# Patient Record
Sex: Male | Born: 1985 | Race: White | Hispanic: No | Smoking: Current every day smoker
Health system: Southern US, Community
[De-identification: ages and names within clinical notes are randomized; demographics above are authoritative.]

## PROBLEM LIST (undated history)

## (undated) DIAGNOSIS — B1920 Unspecified viral hepatitis C without hepatic coma: Secondary | ICD-10-CM

## (undated) DIAGNOSIS — F1990 Other psychoactive substance use, unspecified, uncomplicated: Secondary | ICD-10-CM

## (undated) DIAGNOSIS — B182 Chronic viral hepatitis C: Secondary | ICD-10-CM

## (undated) DIAGNOSIS — F952 Tourette's disorder: Secondary | ICD-10-CM

## (undated) HISTORY — PX: TONSILLECTOMY: SUR1361

---
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
## 2007-03-04 ENCOUNTER — Other Ambulatory Visit: Payer: Self-pay

## 2007-03-04 ENCOUNTER — Ambulatory Visit (HOSPITAL_COMMUNITY): Admission: EM | Admit: 2007-03-04 | Discharge: 2007-03-04 | Payer: Self-pay | Admitting: Internal Medicine

## 2010-10-21 NOTE — Consult Note (Signed)
NAME:  Hunter [name], Hunter [name]                  ACCOUNT NO.:  [id number]   MEDICAL RECORD NO.:  [id number]          PATIENT TYPE:  EMS   LOCATION:  ED                            FACILITY:  [facility]   PHYSICIAN:  Barbaraann Barthel, M.D. DATE OF BIRTH:  [birth year]   DATE OF CONSULTATION:  03/04/2007  DATE OF DISCHARGE:  03/04/2007                                 CONSULTATION   Surgery was asked to see this 25 year old white male who had undergone a  tonsillectomy in [place] on March 01, 2007.  He came to  [facility] and began bleeding, came to the emergency room.  I was called  emergently.  We were able to contact the ENT people from [place] and  they made arrangements in the interim to ship him emergently there, as  there was no ENT physician in their local office.   TREATMENT RENDERED HERE:  We had him gargle and I used silver nitrate  sticks to help cauterize an area around his right tonsillar fossa and  held a sponge on a sponge stick against this, and the patient is being  transferred emergently to the ENT people.  His H&H was 13 and 38.      Barbaraann Barthel, M.D.  Electronically Signed     WB/MEDQ  D:  03/04/2007  T:  03/04/2007  Job:  [id number]   cc:   Jeannett Senior. Pollyann Kennedy, MD  Fax: [phone]

## 2010-10-21 NOTE — Op Note (Signed)
NAME:  Rafalski, Vern                  ACCOUNT NO.:  [id number]   MEDICAL RECORD NO.:  [id number]          PATIENT TYPE:  EMS   LOCATION:  MAJO                         FACILITY:  [facility]   PHYSICIAN:  Jefry H. Pollyann Kennedy, MD     DATE OF BIRTH:  [birth year]   DATE OF PROCEDURE:  03/04/2007  DATE OF DISCHARGE:                               OPERATIVE REPORT   PREOPERATIVE DIAGNOSIS:  Post-tonsillectomy hemorrhage.   POSTOPERATIVE DIAGNOSIS:  Post-tonsillectomy hemorrhage.   PROCEDURE:  Examination under anesthesia with control of tonsillectomy  hemorrhage.   SURGEON:  Jefry H. Pollyann Kennedy, MD   General endotracheal anesthesia was used.   No complications.   FINDINGS:  Large area of granulation tissue along the right mid  tonsillar fossa with an active bleed arising from the center of the  granuloma.  No other abnormalities identified.   HISTORY:  A 25 year old underwent tonsillectomy about 8 or 9 days prior  [place].  He had significant bleeding last night and then again  this morning that would not stop.  He was transferred down from [facility] Emergency Room.  The risks, benefits, alternatives, complications  of the procedure were explained to the patient who seemed to understand  and agreed to surgery.   PROCEDURE:  The patient was taken to the operating room and placed on  the operating room table in the supine position.  Following induction of  general endotracheal anesthesia, the table was turned and the patient  was draped in the standard fashion.  A Crowe-Davis mouth gag was  inserted into the oral cavity, used to retract the tongue and mandible  and attached to the Mayo stand.  Blood and clot was evacuated from the  pharynx and the oral cavity.  Careful inspection of the pharynx revealed  the large clot and granulation tissue of the right mid fossa.  This was  all gently dissected off with the suction and Sales executive.  Suction  cautery was then used to cauterize the  bleeding vessel.  The oral cavity  and pharynx were rinsed with saline and suctioned of all blood and  irrigant.  A 16 French [place] sump was entered into the stomach and used  to irrigate with saline and evacuate all of the blood.   The patient was then awakened, extubated and transferred to recovery in  stable condition.      Jefry H. Pollyann Kennedy, MD  Electronically Signed     JHR/MEDQ  D:  03/04/2007  T:  03/04/2007  Job:  [id number]

## 2010-10-21 NOTE — H&P (Signed)
NAME:  Hunter [name], Hunter [name]                  ACCOUNT NO.:  [id number]   MEDICAL RECORD NO.:  [id number]          PATIENT TYPE:  EMS   LOCATION:  MAJO                         FACILITY:  [facility]   PHYSICIAN:  Jefry H. Pollyann Kennedy, MD     DATE OF BIRTH:  1986-04-28   DATE OF ADMISSION:  03/04/2007  DATE OF DISCHARGE:                              HISTORY & PHYSICAL   TIME SEEN:  11:30 a.m.   SITE:  [facility] Emergency Department.   REASON FOR ADMISSION:  Post-tonsillectomy hemorrhage.   HISTORY:  This is a 25 year old who underwent a tonsillectomy in [place] on the previous Tuesday about 8 or 9 days prior to admission.  He came to spend his recuperation time with his mother in [place],  and he started having some hemorrhage last night, and then hemorrhaged  again this morning.  He went to the [facility] Emergency Department, and  was then transferred by ambulance to [facility] for treatment.   PAST MEDICAL HISTORY:  Significant for turrets syndrome.   MEDICATIONS:  No medications.   ALLERGIES:  No known drug allergies.   SOCIAL HISTORY:  He does not smoke or drink.   PHYSICAL EXAMINATION:  Healthy-appearing, young man.  He has active  bleeding from his pharynx.  There is dry blood all over his face and  hands.  There is no other obvious head and neck pathology present.   IMPRESSION:  Post-tonsillectomy hemorrhage.   PLAN:  Admit to the hospital.  Bring him to the operating room as soon  as possible to perform an exam under anesthesia and cauterization to  control the hemorrhage.      Jefry H. Pollyann Kennedy, MD  Electronically Signed     JHR/MEDQ  D:  03/04/2007  T:  03/04/2007  Job:  [id number]

## 2011-03-19 LAB — DIFFERENTIAL
Basophils Absolute: 0
Basophils Relative: 0
Eosinophils Absolute: 0.1
Eosinophils Relative: 1
Lymphocytes Relative: 29
Lymphs Abs: 2.5
Monocytes Absolute: 0.8 — ABNORMAL HIGH
Monocytes Relative: 10
Neutro Abs: 5
Neutrophils Relative %: 60

## 2011-03-19 LAB — CROSSMATCH
ABO/RH(D): O POS
Antibody Screen: NEGATIVE

## 2011-03-19 LAB — CBC
HCT: 32.4 — ABNORMAL LOW
HCT: 38.2 — ABNORMAL LOW
Hemoglobin: 11.2 — ABNORMAL LOW
Hemoglobin: 13.1
MCHC: 34.3
MCHC: 34.5
MCV: 85.7
MCV: 86.3
Platelets: 250
Platelets: 269
RBC: 3.75 — ABNORMAL LOW
RBC: 4.46
RDW: 12.4
RDW: 12.6
WBC: 11 — ABNORMAL HIGH
WBC: 8.4

## 2017-04-26 ENCOUNTER — Other Ambulatory Visit: Payer: Self-pay

## 2017-04-26 ENCOUNTER — Inpatient Hospital Stay (HOSPITAL_BASED_OUTPATIENT_CLINIC_OR_DEPARTMENT_OTHER)
Admission: EM | Admit: 2017-04-26 | Discharge: 2017-05-01 | DRG: 501 | Disposition: A | Discharge status: Home / Self Care | Payer: Self-pay | Attending: Family Medicine | Admitting: Family Medicine

## 2017-04-26 ENCOUNTER — Inpatient Hospital Stay (HOSPITAL_COMMUNITY): Payer: Self-pay | Admitting: Anesthesiology

## 2017-04-26 ENCOUNTER — Emergency Department (HOSPITAL_BASED_OUTPATIENT_CLINIC_OR_DEPARTMENT_OTHER): Payer: Self-pay

## 2017-04-26 ENCOUNTER — Encounter (HOSPITAL_BASED_OUTPATIENT_CLINIC_OR_DEPARTMENT_OTHER): Payer: Self-pay | Admitting: *Deleted

## 2017-04-26 ENCOUNTER — Encounter (HOSPITAL_COMMUNITY)
Admission: EM | Disposition: A | Discharge status: Home / Self Care | Payer: Self-pay | Source: Home / Self Care | Attending: Family Medicine

## 2017-04-26 DIAGNOSIS — D649 Anemia, unspecified: Secondary | ICD-10-CM | POA: Diagnosis present

## 2017-04-26 DIAGNOSIS — R7881 Bacteremia: Secondary | ICD-10-CM

## 2017-04-26 DIAGNOSIS — F1110 Opioid abuse, uncomplicated: Secondary | ICD-10-CM | POA: Diagnosis present

## 2017-04-26 DIAGNOSIS — L03113 Cellulitis of right upper limb: Secondary | ICD-10-CM | POA: Diagnosis present

## 2017-04-26 DIAGNOSIS — L0390 Cellulitis, unspecified: Secondary | ICD-10-CM | POA: Diagnosis present

## 2017-04-26 DIAGNOSIS — A419 Sepsis, unspecified organism: Secondary | ICD-10-CM

## 2017-04-26 DIAGNOSIS — F1990 Other psychoactive substance use, unspecified, uncomplicated: Secondary | ICD-10-CM | POA: Diagnosis present

## 2017-04-26 DIAGNOSIS — L02413 Cutaneous abscess of right upper limb: Secondary | ICD-10-CM | POA: Diagnosis present

## 2017-04-26 DIAGNOSIS — B182 Chronic viral hepatitis C: Secondary | ICD-10-CM | POA: Diagnosis present

## 2017-04-26 DIAGNOSIS — Z23 Encounter for immunization: Secondary | ICD-10-CM

## 2017-04-26 DIAGNOSIS — F17200 Nicotine dependence, unspecified, uncomplicated: Secondary | ICD-10-CM | POA: Diagnosis present

## 2017-04-26 DIAGNOSIS — L02419 Cutaneous abscess of limb, unspecified: Secondary | ICD-10-CM

## 2017-04-26 DIAGNOSIS — B955 Unspecified streptococcus as the cause of diseases classified elsewhere: Secondary | ICD-10-CM | POA: Diagnosis present

## 2017-04-26 DIAGNOSIS — E860 Dehydration: Secondary | ICD-10-CM | POA: Diagnosis present

## 2017-04-26 DIAGNOSIS — L02511 Cutaneous abscess of right hand: Secondary | ICD-10-CM | POA: Diagnosis present

## 2017-04-26 DIAGNOSIS — R509 Fever, unspecified: Secondary | ICD-10-CM

## 2017-04-26 DIAGNOSIS — M659 Synovitis and tenosynovitis, unspecified: Principal | ICD-10-CM | POA: Diagnosis present

## 2017-04-26 DIAGNOSIS — E871 Hypo-osmolality and hyponatremia: Secondary | ICD-10-CM | POA: Diagnosis present

## 2017-04-26 HISTORY — PX: I&D EXTREMITY: SHX5045

## 2017-04-26 HISTORY — DX: Other psychoactive substance use, unspecified, uncomplicated: F19.90

## 2017-04-26 HISTORY — DX: Unspecified viral hepatitis C without hepatic coma: B19.20

## 2017-04-26 HISTORY — DX: Chronic viral hepatitis C: B18.2

## 2017-04-26 LAB — URINALYSIS, ROUTINE W REFLEX MICROSCOPIC
BILIRUBIN URINE: NEGATIVE
Glucose, UA: NEGATIVE mg/dL
HGB URINE DIPSTICK: NEGATIVE
Ketones, ur: NEGATIVE mg/dL
Leukocytes, UA: NEGATIVE
Nitrite: NEGATIVE
PROTEIN: NEGATIVE mg/dL
Specific Gravity, Urine: 1.01 (ref 1.005–1.030)
pH: 6 (ref 5.0–8.0)

## 2017-04-26 LAB — COMPREHENSIVE METABOLIC PANEL
ALBUMIN: 3.6 g/dL (ref 3.5–5.0)
ALK PHOS: 100 U/L (ref 38–126)
ALT: 25 U/L (ref 17–63)
AST: 27 U/L (ref 15–41)
Anion gap: 9 (ref 5–15)
BUN: 14 mg/dL (ref 6–20)
CO2: 25 mmol/L (ref 22–32)
CREATININE: 0.87 mg/dL (ref 0.61–1.24)
Calcium: 8.8 mg/dL — ABNORMAL LOW (ref 8.9–10.3)
Chloride: 94 mmol/L — ABNORMAL LOW (ref 101–111)
GFR calc Af Amer: >60 mL/min (ref >60)
GFR calc non Af Amer: >60 mL/min (ref >60)
GLUCOSE: 108 mg/dL — AB (ref 65–99)
Potassium: 3.6 mmol/L (ref 3.5–5.1)
SODIUM: 128 mmol/L — AB (ref 135–145)
Total Bilirubin: 0.8 mg/dL (ref 0.3–1.2)
Total Protein: 8.3 g/dL — ABNORMAL HIGH (ref 6.5–8.1)

## 2017-04-26 LAB — CBC WITH DIFFERENTIAL/PLATELET
BASOS PCT: 0 %
Basophils Absolute: 0 10*3/uL (ref 0.0–0.1)
EOS ABS: 0.1 10*3/uL (ref 0.0–0.7)
EOS PCT: 1 %
HCT: 34.7 % — ABNORMAL LOW (ref 39.0–52.0)
Hemoglobin: 11.6 g/dL — ABNORMAL LOW (ref 13.0–17.0)
LYMPHS ABS: 1.3 10*3/uL (ref 0.7–4.0)
Lymphocytes Relative: 9 %
MCH: 28.6 pg (ref 26.0–34.0)
MCHC: 33.4 g/dL (ref 30.0–36.0)
MCV: 85.5 fL (ref 78.0–100.0)
Monocytes Absolute: 2.4 10*3/uL — ABNORMAL HIGH (ref 0.1–1.0)
Monocytes Relative: 16 %
Neutro Abs: 11.6 10*3/uL — ABNORMAL HIGH (ref 1.7–7.7)
Neutrophils Relative %: 74 %
PLATELETS: 365 10*3/uL (ref 150–400)
RBC: 4.06 MIL/uL — AB (ref 4.22–5.81)
RDW: 12.5 % (ref 11.5–15.5)
WBC: 15.5 10*3/uL — AB (ref 4.0–10.5)

## 2017-04-26 LAB — I-STAT CG4 LACTIC ACID, ED
Lactic Acid, Venous: 1.8 mmol/L (ref 0.5–1.9)
Lactic Acid, Venous: 1.28 mmol/L (ref 0.5–1.9)

## 2017-04-26 SURGERY — IRRIGATION AND DEBRIDEMENT EXTREMITY
Anesthesia: General | Site: Arm Lower | Laterality: Right

## 2017-04-26 MED ORDER — PROPOFOL 10 MG/ML IV BOLUS
INTRAVENOUS | Status: DC | PRN
  Administered 2017-04-26: 40 mg via INTRAVENOUS
  Administered 2017-04-26: 160 mg via INTRAVENOUS

## 2017-04-26 MED ORDER — ACETAMINOPHEN 500 MG PO TABS
1000.0000 mg | ORAL_TABLET | Freq: Once | ORAL | Status: AC
  Administered 2017-04-26: 1000 mg via ORAL
  Filled 2017-04-26: qty 2

## 2017-04-26 MED ORDER — SUCCINYLCHOLINE CHLORIDE 20 MG/ML IJ SOLN
INTRAMUSCULAR | Status: DC | PRN
  Administered 2017-04-26: 100 mg via INTRAVENOUS

## 2017-04-26 MED ORDER — MIDAZOLAM HCL 2 MG/2ML IJ SOLN
INTRAMUSCULAR | Status: AC
  Filled 2017-04-26: qty 2

## 2017-04-26 MED ORDER — HYDROMORPHONE HCL 1 MG/ML IJ SOLN
0.2500 mg | INTRAMUSCULAR | Status: DC | PRN
  Administered 2017-04-26 – 2017-04-27 (×4): 0.5 mg via INTRAVENOUS

## 2017-04-26 MED ORDER — SODIUM CHLORIDE 0.9 % IV BOLUS (SEPSIS)
1000.0000 mL | Freq: Once | INTRAVENOUS | Status: AC
  Administered 2017-04-26: 1000 mL via INTRAVENOUS

## 2017-04-26 MED ORDER — MIDAZOLAM HCL 5 MG/5ML IJ SOLN
INTRAMUSCULAR | Status: DC | PRN
  Administered 2017-04-26: 2 mg via INTRAVENOUS

## 2017-04-26 MED ORDER — ONDANSETRON HCL 4 MG/2ML IJ SOLN
INTRAMUSCULAR | Status: DC | PRN
  Administered 2017-04-26: 4 mg via INTRAVENOUS

## 2017-04-26 MED ORDER — PIPERACILLIN-TAZOBACTAM 3.375 G IVPB
INTRAVENOUS | Status: AC
  Filled 2017-04-26: qty 50

## 2017-04-26 MED ORDER — VANCOMYCIN HCL IN DEXTROSE 1-5 GM/200ML-% IV SOLN
1000.0000 mg | Freq: Three times a day (TID) | INTRAVENOUS | Status: DC
Start: 2017-04-26 — End: 2017-04-27
  Administered 2017-04-27 (×2): 1000 mg via INTRAVENOUS
  Filled 2017-04-26 (×3): qty 200

## 2017-04-26 MED ORDER — FENTANYL CITRATE (PF) 250 MCG/5ML IJ SOLN
INTRAMUSCULAR | Status: DC | PRN
  Administered 2017-04-26: 50 ug via INTRAVENOUS
  Administered 2017-04-26: 100 ug via INTRAVENOUS
  Administered 2017-04-26 (×2): 50 ug via INTRAVENOUS

## 2017-04-26 MED ORDER — PIPERACILLIN-TAZOBACTAM 3.375 G IVPB 30 MIN
3.3750 g | Freq: Once | INTRAVENOUS | Status: AC
  Administered 2017-04-26: 3.375 g via INTRAVENOUS
  Filled 2017-04-26: qty 50

## 2017-04-26 MED ORDER — VANCOMYCIN HCL 500 MG IV SOLR
INTRAVENOUS | Status: AC
  Filled 2017-04-26: qty 1500

## 2017-04-26 MED ORDER — KETAMINE HCL-SODIUM CHLORIDE 100-0.9 MG/10ML-% IV SOSY
PREFILLED_SYRINGE | INTRAVENOUS | Status: AC
  Filled 2017-04-26: qty 10

## 2017-04-26 MED ORDER — VANCOMYCIN HCL 10 G IV SOLR
1500.0000 mg | Freq: Once | INTRAVENOUS | Status: AC
  Administered 2017-04-26: 1500 mg via INTRAVENOUS
  Filled 2017-04-26: qty 1500

## 2017-04-26 MED ORDER — PIPERACILLIN-TAZOBACTAM 3.375 G IVPB
3.3750 g | Freq: Three times a day (TID) | INTRAVENOUS | Status: DC
  Filled 2017-04-26 (×3): qty 50

## 2017-04-26 MED ORDER — PROPOFOL 10 MG/ML IV BOLUS
INTRAVENOUS | Status: AC
  Filled 2017-04-26: qty 20

## 2017-04-26 MED ORDER — FENTANYL CITRATE (PF) 250 MCG/5ML IJ SOLN
INTRAMUSCULAR | Status: AC
  Filled 2017-04-26: qty 5

## 2017-04-26 MED ORDER — HYDROMORPHONE HCL 1 MG/ML IJ SOLN
INTRAMUSCULAR | Status: AC
  Filled 2017-04-26: qty 2

## 2017-04-26 MED ORDER — LIDOCAINE HCL (CARDIAC) 20 MG/ML IV SOLN
INTRAVENOUS | Status: DC | PRN
  Administered 2017-04-26: 100 mg via INTRATRACHEAL

## 2017-04-26 MED ORDER — KETAMINE HCL 10 MG/ML IJ SOLN
INTRAMUSCULAR | Status: DC | PRN
  Administered 2017-04-26: 20 mg via INTRAVENOUS
  Administered 2017-04-26: 50 mg via INTRAVENOUS

## 2017-04-26 MED ORDER — LACTATED RINGERS IV SOLN
INTRAVENOUS | Status: DC | PRN
  Administered 2017-04-26: 23:00:00 via INTRAVENOUS

## 2017-04-26 MED ORDER — PROMETHAZINE HCL 25 MG/ML IJ SOLN: 6.2500 mg | INTRAMUSCULAR | Status: DC | PRN

## 2017-04-26 MED ORDER — SODIUM CHLORIDE 0.9 % IV SOLN
Freq: Once | INTRAVENOUS | Status: AC
  Administered 2017-04-26: 17:00:00 via INTRAVENOUS

## 2017-04-26 SURGICAL SUPPLY — 40 items
BANDAGE ACE 3X5.8 VEL STRL LF (GAUZE/BANDAGES/DRESSINGS) ×3 IMPLANT
BANDAGE ACE 4X5 VEL STRL LF (GAUZE/BANDAGES/DRESSINGS) ×3 IMPLANT
BNDG ESMARK 4X9 LF (GAUZE/BANDAGES/DRESSINGS) ×3 IMPLANT
BNDG GAUZE ELAST 4 BULKY (GAUZE/BANDAGES/DRESSINGS) ×3 IMPLANT
CORDS BIPOLAR (ELECTRODE) ×3 IMPLANT
COVER SURGICAL LIGHT HANDLE (MISCELLANEOUS) ×3 IMPLANT
CUFF TOURNIQUET SINGLE 18IN (TOURNIQUET CUFF) ×3 IMPLANT
DRAPE SURG 17X23 STRL (DRAPES) ×3 IMPLANT
DRSG ADAPTIC 3X8 NADH LF (GAUZE/BANDAGES/DRESSINGS) ×3 IMPLANT
GAUZE SPONGE 4X4 12PLY STRL (GAUZE/BANDAGES/DRESSINGS) ×9 IMPLANT
GAUZE SPONGE 4X4 12PLY STRL LF (GAUZE/BANDAGES/DRESSINGS) ×3 IMPLANT
GLOVE SURG ORTHO 8.0 STRL STRW (GLOVE) ×3 IMPLANT
GOWN STRL REUS W/ TWL LRG LVL3 (GOWN DISPOSABLE) ×3 IMPLANT
GOWN STRL REUS W/ TWL XL LVL3 (GOWN DISPOSABLE) ×1 IMPLANT
GOWN STRL REUS W/TWL LRG LVL3 (GOWN DISPOSABLE) ×6
GOWN STRL REUS W/TWL XL LVL3 (GOWN DISPOSABLE) ×2
KIT BASIN OR (CUSTOM PROCEDURE TRAY) ×3 IMPLANT
KIT ROOM TURNOVER OR (KITS) ×3 IMPLANT
MANIFOLD NEPTUNE II (INSTRUMENTS) ×3 IMPLANT
NEEDLE HYPO 25GX1X1/2 BEV (NEEDLE) IMPLANT
NS IRRIG 1000ML POUR BTL (IV SOLUTION) ×3 IMPLANT
PACK ORTHO EXTREMITY (CUSTOM PROCEDURE TRAY) ×3 IMPLANT
PAD ARMBOARD 7.5X6 YLW CONV (MISCELLANEOUS) ×6 IMPLANT
PAD CAST 4YDX4 CTTN HI CHSV (CAST SUPPLIES) ×1 IMPLANT
PADDING CAST COTTON 4X4 STRL (CAST SUPPLIES) ×2
SET CYSTO W/LG BORE CLAMP LF (SET/KITS/TRAYS/PACK) ×3 IMPLANT
SOAP 2 % CHG 4 OZ (WOUND CARE) ×3 IMPLANT
SPONGE LAP 18X18 X RAY DECT (DISPOSABLE) ×3 IMPLANT
SPONGE LAP 4X18 X RAY DECT (DISPOSABLE) ×3 IMPLANT
SUCTION FRAZIER HANDLE 10FR (MISCELLANEOUS) ×2
SUCTION TUBE FRAZIER 10FR DISP (MISCELLANEOUS) ×1 IMPLANT
SWAB COLLECTION DEVICE MRSA (MISCELLANEOUS) ×3 IMPLANT
SWAB CULTURE ESWAB REG 1ML (MISCELLANEOUS) ×3 IMPLANT
TOWEL OR 17X24 6PK STRL BLUE (TOWEL DISPOSABLE) ×3 IMPLANT
TOWEL OR 17X26 10 PK STRL BLUE (TOWEL DISPOSABLE) ×3 IMPLANT
TUBE CONNECTING 12'X1/4 (SUCTIONS) ×1
TUBE CONNECTING 12X1/4 (SUCTIONS) ×2 IMPLANT
UNDERPAD 30X30 (UNDERPADS AND DIAPERS) ×3 IMPLANT
WATER STERILE IRR 1000ML POUR (IV SOLUTION) ×3 IMPLANT
YANKAUER SUCT BULB TIP NO VENT (SUCTIONS) ×3 IMPLANT

## 2017-04-26 NOTE — ED Triage Notes (Signed)
Infection in his right hand x 4 days as a result of heroin injection.

## 2017-04-26 NOTE — ED Notes (Signed)
[facility]

## 2017-04-26 NOTE — Progress Notes (Addendum)
Pharmacy Antibiotic Note  Hunter [name] is a 31 y.o. male admitted on 04/26/2017 with wound infection.  Pharmacy has been consulted for vancomycin and Zosyn dosing. History of recent IV drug use. WBC 15.5, Tmax 103.2  Plan: Vancomycin 1500mg  IV once then 1000mg  IV every 8 hours.  Goal trough 15-20 mcg/mL. Zosyn 3.375g IV q8h (4 hour infusion).  Height: 6\' 1"  (185.4 cm) Weight: 170 lb (77.1 kg) IBW/kg (Calculated) : 79.9  Temp (24hrs), Avg:103.2 F (39.6 C), Min:103.2 F (39.6 C), Max:103.2 F (39.6 C)  Recent Labs  Lab 04/26/17 1614 04/26/17 1627  WBC 15.5*  --   CREATININE 0.87  --   LATICACIDVEN  --  1.80    Estimated Creatinine Clearance: 134.2 mL/min (by C-G formula based on SCr of 0.87 mg/dL).    No Known Allergies   Thank you for allowing pharmacy to be a part of this patient's care.  Toniann Failony L Pharoah Goggins 04/26/2017 4:53 PM

## 2017-04-26 NOTE — ED Notes (Signed)
Family at bedside. 

## 2017-04-26 NOTE — Consult Note (Addendum)
Reason for Consult: Right hand abscess Referring Physician: Dr. Henreitta Leber Jvion Turgeon is an 31 y.o. male.  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 Presented with right hand swelling/pain meeting criteria for Sepsis secondary to cellulitis/hand wound. Started on Vancomycin and Zosyn. Blood cultures pending. No imaging requested per hand surgery. Not given IV fluid bolus secondary to stable blood pressure and normal lactic acid. Accepted to telemetry. Inpatient status.    Past Medical History:  Diagnosis Date  . Hepatitis-C     Past Surgical History:  Procedure Laterality Date  . TONSILLECTOMY      History reviewed. No pertinent family history.  Social History:  reports that he has been smoking.  he has never used smokeless tobacco. He reports that he uses drugs. Drug: IV. He reports that he does not drink alcohol.  Allergies: No Known Allergies  Medications: I have reviewed the patient's current medications.  Results for orders placed or performed during the hospital encounter of 04/26/17 (from the past 48 hour(s))  Comprehensive metabolic panel     Status: Abnormal   Collection Time: 04/26/17  4:14 PM  Result Value Ref Range   Sodium 128 (L) 135 - 145 mmol/L   Potassium 3.6 3.5 - 5.1 mmol/L   Chloride 94 (L) 101 - 111 mmol/L   CO2 25 22 - 32 mmol/L   Glucose, Bld 108 (H) 65 - 99 mg/dL   BUN 14 6 - 20 mg/dL   Creatinine, Ser 0.87 0.61 - 1.24 mg/dL   Calcium 8.8 (L) 8.9 - 10.3 mg/dL   Total Protein 8.3 (H) 6.5 - 8.1 g/dL   Albumin 3.6 3.5 - 5.0 g/dL   AST 27 15 - 41 U/L   ALT 25 17 - 63 U/L   Alkaline Phosphatase 100 38 - 126 U/L   Total Bilirubin 0.8 0.3 - 1.2 mg/dL   GFR calc non Af Amer >60 >60 mL/min   GFR calc Af Amer >60 >60 mL/min    Comment: (NOTE) The eGFR has been calculated using the CKD EPI equation. This calculation has not been validated in all clinical situations. eGFR's persistently <60 mL/min signify possible Chronic Kidney Disease.     Anion gap 9 5 - 15  CBC with Differential     Status: Abnormal   Collection Time: 04/26/17  4:14 PM  Result Value Ref Range   WBC 15.5 (H) 4.0 - 10.5 K/uL   RBC 4.06 (L) 4.22 - 5.81 MIL/uL   Hemoglobin 11.6 (L) 13.0 - 17.0 g/dL   HCT 34.7 (L) 39.0 - 52.0 %   MCV 85.5 78.0 - 100.0 fL   MCH 28.6 26.0 - 34.0 pg   MCHC 33.4 30.0 - 36.0 g/dL   RDW 12.5 11.5 - 15.5 %   Platelets 365 150 - 400 K/uL   Neutrophils Relative % 74 %   Neutro Abs 11.6 (H) 1.7 - 7.7 K/uL   Lymphocytes Relative 9 %   Lymphs Abs 1.3 0.7 - 4.0 K/uL   Monocytes Relative 16 %   Monocytes Absolute 2.4 (H) 0.1 - 1.0 K/uL   Eosinophils Relative 1 %   Eosinophils Absolute 0.1 0.0 - 0.7 K/uL   Basophils Relative 0 %   Basophils Absolute 0.0 0.0 - 0.1 K/uL  I-Stat CG4 Lactic Acid, ED     Status: None   Collection Time: 04/26/17  4:27 PM  Result Value Ref Range   Lactic Acid, Venous 1.80 0.5 - 1.9 mmol/L  Wound or Superficial Culture     Status: None (Preliminary result)   Collection Time: 04/26/17  4:30 PM  Result Value Ref Range   Specimen Description WOUND RIGHT HAND    Special Requests Normal    Gram Stain      NO WBC SEEN FEW GRAM POSITIVE COCCI Performed at [facility], [place] [address], [place], [place] [address]    Culture PENDING    Report Status PENDING   I-Stat CG4 Lactic Acid, ED     Status: None   Collection Time: 04/26/17  6:27 PM  Result Value Ref Range   Lactic Acid, Venous 1.28 0.5 - 1.9 mmol/L  Urinalysis, Routine w reflex microscopic     Status: None   Collection Time: 04/26/17  6:40 PM  Result Value Ref Range   Color, Urine YELLOW YELLOW   APPearance CLEAR CLEAR   Specific Gravity, Urine 1.010 1.005 - 1.030   pH 6.0 5.0 - 8.0   Glucose, UA NEGATIVE NEGATIVE mg/dL   Hgb urine dipstick NEGATIVE NEGATIVE   Bilirubin Urine NEGATIVE NEGATIVE   Ketones, ur NEGATIVE NEGATIVE mg/dL   Protein, ur NEGATIVE NEGATIVE mg/dL   Nitrite NEGATIVE NEGATIVE   Leukocytes, UA NEGATIVE NEGATIVE     Comment: Microscopic not done on urines with negative protein, blood, leukocytes, nitrite, or glucose < 500 mg/dL.    Dg Chest Port 1 View  Result Date: 04/26/2017 CLINICAL DATA:  Fever.  Right hand swelling. EXAM: PORTABLE CHEST 1 VIEW COMPARISON:  None. FINDINGS: The cardiomediastinal silhouette is within normal limits. The bronchovascular markings are minimally prominent bilaterally without evidence of lobar consolidation, overt edema, sizable pleural effusion, or pneumothorax. No acute osseous abnormality is seen. IMPRESSION: No evidence of pneumonia. Electronically Signed   By: Logan Bores M.D.   On: 04/26/2017 17:38   Dg Hand Complete Right  Result Date: 04/26/2017 CLINICAL DATA:  Right hand swelling EXAM: RIGHT HAND - COMPLETE 3+ VIEW COMPARISON:  None. FINDINGS: There is severe soft tissue swelling of the right hand. The bones are normal. No soft tissue emphysema. IMPRESSION: Severe right hand soft tissue swelling without osseous abnormality. Electronically Signed   By: Ulyses Jarred M.D.   On: 04/26/2017 17:37    ROS as noted in chart   Blood pressure 108/68, pulse 86, temperature 98.9 F (37.2 C), temperature source Oral, resp. rate 20, height 6' 1"  (1.854 m), weight 170 lb (77.1 kg), SpO2 100 %. Physical Exam The patient does have the large amount of swelling over the dorsal aspect of the hand. The patient does have the fluctuant area and the evidence of the deep space abscess Large amount of swelling in the forearm. He is able to extend his thumb extend his digits his fingertips are warm well perfused  Assessment/Plan: Intravenous drug use, current heroin use this morning Right forearm and hand abscess   Right hand and forearm incision and drainage  Patient be admitted back to the medicine service following completion of this study and surgery. Continue inpatient care. The patient may require repeat I&D within 24 hours, 48 hours We'll follow closely    Linna Hoff 04/26/2017, 10:50 PM

## 2017-04-26 NOTE — Anesthesia Procedure Notes (Signed)
Procedure Name: Intubation Date/Time: 04/26/2017 11:02 PM Performed by: Claudina LickMahony, Kavina Cantave D, CRNA Pre-anesthesia Checklist: Patient identified, Emergency Drugs available, Suction available, Patient being monitored and Timeout performed Patient Re-evaluated:Patient Re-evaluated prior to induction Oxygen Delivery Method: Circle system utilized Preoxygenation: Pre-oxygenation with 100% oxygen Induction Type: IV induction, Rapid sequence and Cricoid Pressure applied Laryngoscope Size: Miller and 2 Grade View: Grade I Tube type: Oral Tube size: 7.5 mm Number of attempts: 1 Airway Equipment and Method: Stylet Placement Confirmation: ETT inserted through vocal cords under direct vision,  positive ETCO2 and breath sounds checked- equal and bilateral Secured at: 22 cm Tube secured with: Tape Dental Injury: Teeth and Oropharynx as per pre-operative assessment

## 2017-04-26 NOTE — ED Notes (Signed)
ED Provider at bedside. 

## 2017-04-26 NOTE — ED Provider Notes (Addendum)
[facility] NORTH ORTHOPEDICS Provider Note   CSN: [id number] Arrival date & time: 04/26/17  1550     History   Chief Complaint Chief Complaint  Patient presents with  . Recurrent Skin Infections    HPI Hunter [name] is a 31 y.o. male with a history of IV heroin use and Tourette's syndrome who presents to the emergency department with right forearm and right hand, warmth, pain, and swelling with associated fever. Febrile to 103.2 in the ED.   The patient reports that he noticed an abscess to the right forearm that began draining spontaneously 1 week ago.  He reports mild redness and swelling to the right forearm.  He reports rapidly worsening redness, warmth, and swelling to the right hand and all fingers in the last 24 hours.  He also states that an area on the dorsum of the right hands began actively draining purulent material overnight.  No treatment prior to arrival.  He reports that the pain is worse with straightening the fingers are moving the right arm.  No alleviating symptoms.  No history of similar.  No allergies to medications.  He denies chest pain, palpitations, or shortness of breath.   He reports he last ate and drank about 3:45 PM.   The history is provided by the patient. No language interpreter was used.    Past Medical History:  Diagnosis Date  . Chronic hepatitis C without hepatic coma (HCC) 04/28/2017  . Hepatitis-C   . IVDU (intravenous drug user)   . Tourette's     Patient Active Problem List   Diagnosis Date Noted  . Atelectasis of both lungs   . Chest wall abscess   . Psoas abscess (HCC) 05/23/2017  . Bacteremia due to methicillin susceptible Staphylococcus aureus (MSSA) 05/23/2017  . Discitis of lumbar region 05/22/2017  . Hypokalemia 05/22/2017  . Normocytic anemia 04/28/2017  . Chronic hepatitis C without hepatic coma (HCC) 04/28/2017  . IVDU (intravenous drug user) 04/27/2017    Past Surgical History:   Procedure Laterality Date  . I&D EXTREMITY Right 04/26/2017   Procedure: IRRIGATION AND DEBRIDEMENT RIGHT HAND AND FOREARM;  Surgeon: Bradly Bienenstock, MD;  Location: [facility] OR;  Service: Orthopedics;  Laterality: Right;  . TEE WITHOUT CARDIOVERSION N/A 04/30/2017   Procedure: TRANSESOPHAGEAL ECHOCARDIOGRAM (TEE);  Surgeon: Quintella Reichert, MD;  Location: [facility] ENDOSCOPY;  Service: Cardiovascular;  Laterality: N/A;  . TEE WITHOUT CARDIOVERSION N/A 05/28/2017   Procedure: TRANSESOPHAGEAL ECHOCARDIOGRAM (TEE);  Surgeon: Pricilla Riffle, MD;  Location: [facility] ENDOSCOPY;  Service: Cardiovascular;  Laterality: N/A;  . TONSILLECTOMY         Home Medications    Prior to Admission medications   Medication Sig Start Date End Date Taking? Authorizing Provider  acetaminophen (TYLENOL) 325 MG tablet Take 2 tablets (650 mg total) by mouth every 6 (six) hours as needed for mild pain (or Fever >/= 101). Patient not taking: Reported on 05/22/2017 05/01/17   Standley Brooking, MD  cyclobenzaprine (FLEXERIL) 5 MG tablet Take 1 tablet (5 mg total) by mouth 3 (three) times daily as needed for muscle spasms. 07/03/17   Darlin Drop, DO    Family History History reviewed. No pertinent family history.  Social History Social History   Tobacco Use  . Smoking status: Current Every Day Smoker    Types: Cigarettes  . Smokeless tobacco: Never Used  Substance Use Topics  . Alcohol use: No    Frequency: Never  . Drug use:  Yes    Types: IV    Comment: heroin     Allergies   Patient has no known allergies.   Review of Systems Review of Systems  Constitutional: Positive for fever. Negative for activity change.  Respiratory: Negative for shortness of breath.   Cardiovascular: Negative for chest pain.  Gastrointestinal: Negative for abdominal pain.  Musculoskeletal: Positive for arthralgias and myalgias. Negative for back pain.  Skin: Positive for color change and wound. Negative for rash.   Physical  Exam Updated Vital Signs BP 115/75 (BP Location: Left Arm)   Pulse 66   Temp 98.7 F (37.1 C) (Oral)   Resp 16   Ht 6\' 1"  (1.854 m)   Wt 77.1 kg (170 lb)   SpO2 100%   BMI 22.43 kg/m   Physical Exam  Constitutional: He appears well-developed. No distress.  HENT:  Head: Normocephalic.  Eyes: Conjunctivae are normal.  Neck: Neck supple.  Cardiovascular: Normal rate and regular rhythm. Exam reveals no gallop and no friction rub.  No murmur heard. Tachycardia.  Normal S1-S2.  No murmurs, rubs, or gallops.  Pulmonary/Chest: Effort normal and breath sounds normal. No stridor. No respiratory distress. He has no wheezes. He has no rales.  Abdominal: Soft. He exhibits no distension.  Musculoskeletal:  Significant erythema, edema, with moderate warmth extending from distal to the elbow to the fingertips on the right hand.  On the palmar surface of the right hand, there is significant redness and swelling over the hyperthenar eminence and first and second digits.  On the radial aspect of the dorsum of the right hand, there is an area that is actively expressing purulent drainage.  On the dorsum of the forearm, just distal to the elbow, there is a second area that is actively expressing purulent drainage.  Neurological: He is alert.  Skin: Skin is warm and dry.  Psychiatric: His behavior is normal.  Nursing note and vitals reviewed.           ED Treatments / Results  Labs (all labs ordered are listed, but only abnormal results are displayed) Labs Reviewed  CULTURE, BLOOD (ROUTINE X 2) - Abnormal; Notable for the following components:      Result Value   Culture   (*)    Value: GROUP A STREP (S.PYOGENES) ISOLATED SUSCEPTIBILITIES PERFORMED ON PREVIOUS CULTURE WITHIN THE LAST 5 DAYS. HEALTH DEPARTMENT NOTIFIED Performed at [facility], [address] [place], [place] [address]    All other components within normal limits  CULTURE, BLOOD (ROUTINE X 2) - Abnormal;  Notable for the following components:   Culture   (*)    Value: GROUP A STREP (S.PYOGENES) ISOLATED HEALTH DEPARTMENT NOTIFIED Performed at [facility], [address] [place], [place] [address]    All other components within normal limits  SURGICAL PCR SCREEN - Abnormal; Notable for the following components:   Staphylococcus aureus POSITIVE (*)    All other components within normal limits  BLOOD CULTURE ID PANEL (REFLEXED) - Abnormal; Notable for the following components:   Streptococcus species DETECTED (*)    Streptococcus pyogenes DETECTED (*)    All other components within normal limits  COMPREHENSIVE METABOLIC PANEL - Abnormal; Notable for the following components:   Sodium 128 (*)    Chloride 94 (*)    Glucose, Bld 108 (*)    Calcium 8.8 (*)    Total Protein 8.3 (*)    All other components within normal limits  CBC WITH  DIFFERENTIAL/PLATELET - Abnormal; Notable for the following components:   WBC 15.5 (*)    RBC 4.06 (*)    Hemoglobin 11.6 (*)    HCT 34.7 (*)    Neutro Abs 11.6 (*)    Monocytes Absolute 2.4 (*)    All other components within normal limits  CBC - Abnormal; Notable for the following components:   WBC 13.3 (*)    RBC 3.42 (*)    Hemoglobin 9.8 (*)    HCT 29.5 (*)    All other components within normal limits  BASIC METABOLIC PANEL - Abnormal; Notable for the following components:   Sodium 133 (*)    Glucose, Bld 121 (*)    Calcium 7.9 (*)    All other components within normal limits  HEPATITIS PANEL, ACUTE - Abnormal; Notable for the following components:   HCV Ab >11.0 (*)    All other components within normal limits  CBC - Abnormal; Notable for the following components:   RBC 3.91 (*)    Hemoglobin 11.1 (*)    HCT 34.2 (*)    All other components within normal limits  BASIC METABOLIC PANEL - Abnormal; Notable for the following components:   Glucose, Bld 107 (*)    Calcium 8.8 (*)    All other components within normal limits  AEROBIC  CULTURE (SUPERFICIAL SPECIMEN)  AEROBIC/ANAEROBIC CULTURE (SURGICAL/DEEP WOUND)  CULTURE, BLOOD (ROUTINE X 2)  CULTURE, BLOOD (ROUTINE X 2)  URINALYSIS, ROUTINE W REFLEX MICROSCOPIC  HIV ANTIBODY (ROUTINE TESTING)  BASIC METABOLIC PANEL  PROTIME-INR  I-STAT CG4 LACTIC ACID, ED  I-STAT CG4 LACTIC ACID, ED    EKG  EKG Interpretation None       Radiology No results found.  Procedures Procedures (including critical care time)  CRITICAL CARE Performed by: Barkley Boards Total critical care time: 30 minutes Critical care time was exclusive of separately billable procedures and treating other patients. Critical care was necessary to treat or prevent imminent or life-threatening deterioration. Critical care was time spent personally by me on the following activities: development of treatment plan with patient and/or surrogate as well as nursing, discussions with consultants, evaluation of patient's response to treatment, examination of patient, obtaining history from patient or surrogate, ordering and performing treatments and interventions, ordering and review of laboratory studies, ordering and review of radiographic studies, pulse oximetry and re-evaluation of patient's condition.  Medications Ordered in ED Medications  piperacillin-tazobactam (ZOSYN) 3.375 GM/50ML IVPB (has no administration in time range)  vancomycin (VANCOCIN) 500 MG powder (has no administration in time range)  HYDROmorphone (DILAUDID) 1 MG/ML injection (has no administration in time range)  HYDROmorphone (DILAUDID) 1 MG/ML injection (has no administration in time range)  acetaminophen (TYLENOL) tablet 1,000 mg (1,000 mg Oral Given 04/26/17 1615)  sodium chloride 0.9 % bolus 1,000 mL (0 mLs Intravenous Stopped 04/26/17 1723)  vancomycin (VANCOCIN) 1,500 mg in sodium chloride 0.9 % 500 mL IVPB (0 mg Intravenous Stopped 04/26/17 1916)  piperacillin-tazobactam (ZOSYN) IVPB 3.375 g (0 g Intravenous Stopped  04/26/17 1702)  0.9 %  sodium chloride infusion ( Intravenous Stopped 04/27/17 0043)  0.9 %  sodium chloride infusion ( Intravenous New Bag/Given 04/27/17 0040)  cyclobenzaprine (FLEXERIL) tablet 7.5 mg (7.5 mg Oral Given 04/27/17 0359)  Influenza vac split quadrivalent PF (FLUARIX) injection 0.5 mL (0.5 mLs Intramuscular Given 04/29/17 1300)     Initial Impression / Assessment and Plan / ED Course  I have reviewed the triage vital signs and the nursing notes.  Pertinent labs & imaging results that were available during my care of the patient were reviewed by me and considered in my medical decision making (see chart for details).     31 year old male with a history of Tourette's syndrome and IV heroin use presenting with fever and significant erythema, edema, and pain in the right hand and forearm.  An area of the distal forearm and radial aspect of the dorsum of the right hand are actively expressing purulent drainage.  Symptoms are concerning for pyogenic tenosynovitis vs underlying necrotizing fasciitis.  Doubt compartment syndrome at this time.  Leukocytosis of 15.  Febrile to 103.2.  Tachycardic in the 120s.  Code sepsis initiated.  Blood cultures x2 were drawn prior to antibiotic administration.  Vancomycin and Zosyn were dosed per pharmacy and started in the emergency department.  The patient was given 1 bolus of fluids and then kept on maintenance fluids.  The patient was discussed with Dr. Eudelia Bunchardama, attending physician.  Consulted hand surgery and spoke with Dr. Melvyn Novasrtmann who will plan for washout in the OR at [facility].  He is currently n.p.o.  Consulted the hospitalist team and Dr. Caleb PoppNettey spoke with Dr. Eudelia Bunchardama who will accept the patient for admission. The patient appears reasonably stabilized for admission considering the current resources, flow, and capabilities available in the ED at this time, and I doubt any other [facility] requiring further screening and/or treatment in the ED prior to  admission.  Final Clinical Impressions(s) / ED Diagnoses   Final diagnoses:  Fever  Abscess of forearm    ED Discharge Orders        Ordered    acetaminophen (TYLENOL) 325 MG tablet  Every 6 hours PRN     05/01/17 1130    amoxicillin-clavulanate (AUGMENTIN) 875-125 MG tablet  2 times daily,   Status:  Discontinued     05/01/17 1130    Discharge instructions    Comments:  Call your physician or seek immediate medical attention for fever, increased pain, redness of hand, swelling, drainage, numbness or worsening of condition. Do not get right arm wet. Leave splint in place until seen by hand surgeon Dr. Melvyn Novasrtmann.   05/01/17 1130    Increase activity slowly     05/01/17 1130    Diet general     05/01/17 1130       Barkley BoardsMcDonald, Maili Shutters A, PA-C 04/26/17 2055    Frederik PearMcDonald, Jaylei Fuerte A, PA-C 09/15/17 1616    Nira Connardama, Pedro Eduardo, MD 09/16/17 [id number]

## 2017-04-26 NOTE — ED Provider Notes (Signed)
Medical screening examination/treatment/procedure(s) were conducted as a shared visit with non-physician practitioner(s) and myself.  I personally evaluated the patient during the encounter. Briefly, the patient is a 31 y.o. male here with right hand and forearm pain in the setting of IV drug use.  Exam notable for significantly swollen and erythematous right hand and forearm with purulent discharge.  Presentation is concerning for deep tissue infection requiring admission, IV antibiotics and hand evaluation for likely incision and drainage..    EKG Interpretation None           Genine Beckett, Hunter GarnetPedro Eduardo, MD 04/26/17 1744

## 2017-04-26 NOTE — Progress Notes (Signed)
31 yo male with a history of IV heroin drug use. Presented with right hand swelling/pain meeting criteria for Sepsis secondary to cellulitis/hand wound. Hand surgery, Dr. Orlan Leavensrtman, consulted and will see on arrival [facility]. Started on Vancomycin and Zosyn. Blood cultures pending. No imaging requested per hand surgery. Not given IV fluid bolus secondary to stable blood pressure and normal lactic acid. Accepted to telemetry. Inpatient status.   Jacquelin Hawkingalph Nettey, MD [facility] 04/26/2017, 5:22 PM Pager: [phone]

## 2017-04-26 NOTE — Op Note (Signed)
PREOPERATIVE DIAGNOSIS: Right hand deep space abscess Right forearm deep space abscess Right wrist extensor tenosynovitis, purulent Active intravenous drug use  POSTOPERATIVE DIAGNOSIS: Same  ATTENDING SURGEON: Dr. Gilman SchmidtFred Ortman who was scrubbed and present for the entire procedure  ASSISTANT SURGEON: None  ANESTHESIA: Gen. via endotracheal tube  OPERATIVE PROCEDURE: #1: Right forearm drainage deep space abscess #2: Right hand drainage deep space abscess to include the hand and wrist #3: Right wrist extensor tenosynovectomy fourth dorsal compartment #4 right wrist second dorsal compartment extensor tenosynovectomy #5: Right thumb EPL extensor tenosynovectomy  IMPLANTS: None  RADIOGRAPHIC INTERPRETATION: None  SURGICAL INDICATIONS: Patient is a right-hand-dominant gentleman with active intravenous drug use or worsening infection and pain. Patient presented to the urgent care and [place] was transferred [facility] for definitive treatment. Risks benefits and alternatives were discussed in detail with the patient in a signed informed consent was obtained.  SURGICAL TECHNIQUE: The patient is properly identified in the preoperative holding area and a mark with a permanent marker made on the right hand indicate correct operative site. Patient is then brought back to operating room placed supine on anesthesia room table for endotracheal anesthesia was administered. Patient received preoperative antibiotics. A well-padded tourniquet was then placed on the right brachium and sealed with the appropriate drape. The right upper extremities and prepped and draped in normal sterile fashion. A timeout was called the correct site was identified and the procedure then begun. Attention was then turned the dorsal aspect of the right hand. Curvilinear incision made directly over the dorsal aspect of the hand extending across the wrist crease. Dissection carried down through skin and subcutaneous  tissue with a gross purulence was then identified. The patient had a gross purulence along the second third and fourth dorsal compartments. Tenosynovectomy was then carried out of the each individual compartment. Wound cultures were then sent. Debridement was then carried out of the skin subcutaneous tissue and the extensor retinaculum given the purulent material. Attention was then turned the right forearm for a separate incision in the proximal radial aspect of the forearm a longitudinal incision made directly over the abscess area. Deep dissection carried down through the subcutaneous tissue through the fascial layer with the abscess was then drained. After open up of both areas the wounds were then thoroughly irrigated. Thorough wound irrigation was then done both abscess areas. The wounds were then loosely reapproximated and closed. Adaptic dressing and a sterile compressive bandage then applied. The patient is then placed in well-padded volar splint explained taken recovery room in good condition.  POSTOPERATIVE PLAN: The patient be admitted to the hospitalist service. Continue on the IV antibiotics and pain control. We'll look of the wounds and 48 hours the patient may need repeat I&D depending on the quality of the wounds. Wound cultures will need to be followed up on.

## 2017-04-26 NOTE — ED Notes (Signed)
Attempt to  call report , nurse not available 

## 2017-04-26 NOTE — Transfer of Care (Signed)
Please cosign corrected note in order to complete chart correction case 

## 2017-04-26 NOTE — Transfer of Care (Incomplete Revision)
Immediate Anesthesia Transfer of Care Note  [facility]  Procedure(s) Performed: IRRIGATION AND DEBRIDEMENT RIGHT HAND AND FOREARM (Right Arm Lower)  Patient Location: PACU  Anesthesia Type:General  Level of Consciousness: drowsy  Airway & Oxygen Therapy: Patient Spontanous Breathing  Post-op Assessment: Report given to RN and Post -op Vital signs reviewed and stable  Post vital signs: Reviewed and stable  Last Vitals:  Vitals:   04/26/17 2136 04/26/17 2345  BP: 108/68   Pulse: 86 98  Resp: 20 (!) 23  Temp: 37.2 C 36.5 C  SpO2: 100% 97%    Last Pain:  Vitals:   04/26/17 2136  TempSrc: Oral  PainSc:          Complications: No apparent anesthesia complications

## 2017-04-26 NOTE — ED Notes (Signed)
Pt transferred to 5 n rm 16 by carelink

## 2017-04-26 NOTE — ED Notes (Addendum)
Girlfriend notified of transfer and rm assignment Foye ClockKristina [phone]

## 2017-04-26 NOTE — Anesthesia Preprocedure Evaluation (Addendum)
Anesthesia Evaluation  Patient identified by MRN, date of birth, ID band Patient awake    Reviewed: Allergy & Precautions, NPO status , Patient's Chart, lab work & pertinent test results  Airway Mallampati: II  TM Distance: >3 FB Neck ROM: Full    Dental  (+) Dental Advisory Given   Pulmonary Current Smoker,    breath sounds clear to auscultation       Cardiovascular negative cardio ROS   Rhythm:Regular Rate:Normal     Neuro/Psych negative neurological ROS     GI/Hepatic negative GI ROS, (+)     substance abuse  IV drug use, Hepatitis -, C  Endo/Other  negative endocrine ROS  Renal/GU negative Renal ROS     Musculoskeletal   Abdominal   Peds  Hematology negative hematology ROS (+)   Anesthesia Other Findings   Reproductive/Obstetrics                            Lab Results  Component Value Date   WBC 15.5 (H) 04/26/2017   HGB 11.6 (L) 04/26/2017   HCT 34.7 (L) 04/26/2017   MCV 85.5 04/26/2017   PLT 365 04/26/2017   Lab Results  Component Value Date   CREATININE 0.87 04/26/2017   BUN 14 04/26/2017   NA 128 (L) 04/26/2017   K 3.6 04/26/2017   CL 94 (L) 04/26/2017   CO2 25 04/26/2017    Anesthesia Physical Anesthesia Plan  ASA: II and emergent  Anesthesia Plan: General   Post-op Pain Management:    Induction: Intravenous  PONV Risk Score and Plan: 2 and Dexamethasone, Ondansetron and Treatment may vary due to age or medical condition  Airway Management Planned: Oral ETT  Additional Equipment:   Intra-op Plan:   Post-operative Plan: Extubation in OR  Informed Consent: I have reviewed the patients History and Physical, chart, labs and discussed the procedure including the risks, benefits and alternatives for the proposed anesthesia with the patient or authorized representative who has indicated his/her understanding and acceptance.   Dental advisory given  Plan  Discussed with: CRNA, Anesthesiologist and Surgeon  Anesthesia Plan Comments:      Anesthesia Quick Evaluation

## 2017-04-27 ENCOUNTER — Inpatient Hospital Stay (HOSPITAL_COMMUNITY): Payer: Self-pay

## 2017-04-27 ENCOUNTER — Encounter (HOSPITAL_COMMUNITY): Payer: Self-pay | Admitting: Internal Medicine

## 2017-04-27 DIAGNOSIS — E871 Hypo-osmolality and hyponatremia: Secondary | ICD-10-CM

## 2017-04-27 DIAGNOSIS — L03113 Cellulitis of right upper limb: Secondary | ICD-10-CM

## 2017-04-27 DIAGNOSIS — L02419 Cutaneous abscess of limb, unspecified: Secondary | ICD-10-CM

## 2017-04-27 DIAGNOSIS — I340 Nonrheumatic mitral (valve) insufficiency: Secondary | ICD-10-CM

## 2017-04-27 DIAGNOSIS — F1990 Other psychoactive substance use, unspecified, uncomplicated: Secondary | ICD-10-CM

## 2017-04-27 DIAGNOSIS — L0390 Cellulitis, unspecified: Secondary | ICD-10-CM | POA: Diagnosis present

## 2017-04-27 LAB — BASIC METABOLIC PANEL
Anion gap: 6 (ref 5–15)
BUN: 7 mg/dL (ref 6–20)
CHLORIDE: 104 mmol/L (ref 101–111)
CO2: 23 mmol/L (ref 22–32)
CREATININE: 0.69 mg/dL (ref 0.61–1.24)
Calcium: 7.9 mg/dL — ABNORMAL LOW (ref 8.9–10.3)
GFR calc non Af Amer: >60 mL/min (ref >60)
Glucose, Bld: 121 mg/dL — ABNORMAL HIGH (ref 65–99)
POTASSIUM: 3.5 mmol/L (ref 3.5–5.1)
SODIUM: 133 mmol/L — AB (ref 135–145)

## 2017-04-27 LAB — ECHOCARDIOGRAM COMPLETE
HEIGHTINCHES: 73 in
WEIGHTICAEL: 2720 [oz_av]

## 2017-04-27 LAB — BLOOD CULTURE ID PANEL (REFLEXED)
ACINETOBACTER BAUMANNII: NOT DETECTED
CANDIDA ALBICANS: NOT DETECTED
CANDIDA GLABRATA: NOT DETECTED
Candida krusei: NOT DETECTED
Candida parapsilosis: NOT DETECTED
Candida tropicalis: NOT DETECTED
ENTEROBACTER CLOACAE COMPLEX: NOT DETECTED
ENTEROBACTERIACEAE SPECIES: NOT DETECTED
ENTEROCOCCUS SPECIES: NOT DETECTED
Escherichia coli: NOT DETECTED
HAEMOPHILUS INFLUENZAE: NOT DETECTED
KLEBSIELLA PNEUMONIAE: NOT DETECTED
Klebsiella oxytoca: NOT DETECTED
LISTERIA MONOCYTOGENES: NOT DETECTED
NEISSERIA MENINGITIDIS: NOT DETECTED
Proteus species: NOT DETECTED
Pseudomonas aeruginosa: NOT DETECTED
STREPTOCOCCUS AGALACTIAE: NOT DETECTED
STREPTOCOCCUS PNEUMONIAE: NOT DETECTED
STREPTOCOCCUS PYOGENES: DETECTED — AB
STREPTOCOCCUS SPECIES: DETECTED — AB
Serratia marcescens: NOT DETECTED
Staphylococcus aureus (BCID): NOT DETECTED
Staphylococcus species: NOT DETECTED

## 2017-04-27 LAB — CBC
HEMATOCRIT: 29.5 % — AB (ref 39.0–52.0)
HEMOGLOBIN: 9.8 g/dL — AB (ref 13.0–17.0)
MCH: 28.7 pg (ref 26.0–34.0)
MCHC: 33.2 g/dL (ref 30.0–36.0)
MCV: 86.3 fL (ref 78.0–100.0)
Platelets: 296 10*3/uL (ref 150–400)
RBC: 3.42 MIL/uL — AB (ref 4.22–5.81)
RDW: 13.1 % (ref 11.5–15.5)
WBC: 13.3 10*3/uL — ABNORMAL HIGH (ref 4.0–10.5)

## 2017-04-27 LAB — SURGICAL PCR SCREEN
MRSA, PCR: NEGATIVE
STAPHYLOCOCCUS AUREUS: POSITIVE — AB

## 2017-04-27 LAB — HIV ANTIBODY (ROUTINE TESTING W REFLEX): HIV Screen 4th Generation wRfx: NONREACTIVE

## 2017-04-27 MED ORDER — CLINDAMYCIN PHOSPHATE 600 MG/50ML IV SOLN
600.0000 mg | Freq: Three times a day (TID) | INTRAVENOUS | Status: DC
  Administered 2017-04-27 – 2017-04-30 (×10): 600 mg via INTRAVENOUS
  Filled 2017-04-27 (×10): qty 50

## 2017-04-27 MED ORDER — HYDROMORPHONE HCL 1 MG/ML IJ SOLN
1.0000 mg | INTRAMUSCULAR | Status: DC | PRN
  Administered 2017-04-27: 1 mg via INTRAVENOUS
  Filled 2017-04-27: qty 1

## 2017-04-27 MED ORDER — IPRATROPIUM-ALBUTEROL 0.5-2.5 (3) MG/3ML IN SOLN: 3.0000 mL | Freq: Four times a day (QID) | RESPIRATORY_TRACT | Status: DC | PRN

## 2017-04-27 MED ORDER — HYDROMORPHONE HCL 1 MG/ML IJ SOLN
0.5000 mg | INTRAMUSCULAR | Status: DC | PRN
  Administered 2017-04-27 (×2): 0.5 mg via INTRAVENOUS

## 2017-04-27 MED ORDER — PENICILLIN G POTASSIUM 5000000 UNITS IJ SOLR
4.0000 10*6.[IU] | INTRAMUSCULAR | Status: DC
  Administered 2017-04-27 – 2017-04-30 (×19): 4 10*6.[IU] via INTRAVENOUS
  Filled 2017-04-27 (×29): qty 4

## 2017-04-27 MED ORDER — SODIUM CHLORIDE 0.9 % IV SOLN
Freq: Once | INTRAVENOUS | Status: AC
  Administered 2017-04-27: 01:00:00 via INTRAVENOUS

## 2017-04-27 MED ORDER — SODIUM CHLORIDE 0.9 % IV SOLN: INTRAVENOUS | Status: DC

## 2017-04-27 MED ORDER — KETOROLAC TROMETHAMINE 30 MG/ML IJ SOLN: 30.0000 mg | Freq: Once | INTRAMUSCULAR | Status: DC

## 2017-04-27 MED ORDER — ENOXAPARIN SODIUM 40 MG/0.4ML ~~LOC~~ SOLN
40.0000 mg | SUBCUTANEOUS | Status: DC
  Administered 2017-04-27 – 2017-04-30 (×4): 40 mg via SUBCUTANEOUS
  Filled 2017-04-27 (×4): qty 0.4

## 2017-04-27 MED ORDER — HYDROCODONE-ACETAMINOPHEN 5-325 MG PO TABS
1.0000 | ORAL_TABLET | Freq: Four times a day (QID) | ORAL | Status: DC | PRN
  Administered 2017-04-27 – 2017-04-30 (×14): 2 via ORAL
  Administered 2017-05-01 (×2): 1 via ORAL
  Administered 2017-05-01: 2 via ORAL
  Filled 2017-04-27 (×8): qty 2
  Filled 2017-04-27: qty 1
  Filled 2017-04-27 (×4): qty 2
  Filled 2017-04-27: qty 1
  Filled 2017-04-27 (×4): qty 2

## 2017-04-27 MED ORDER — HYDROCODONE-ACETAMINOPHEN 5-325 MG PO TABS: 1.0000 | ORAL_TABLET | ORAL | Status: DC | PRN

## 2017-04-27 MED ORDER — CYCLOBENZAPRINE HCL 5 MG PO TABS
7.5000 mg | ORAL_TABLET | ORAL | Status: AC
  Administered 2017-04-27: 7.5 mg via ORAL
  Filled 2017-04-27: qty 1.5

## 2017-04-27 MED ORDER — CYCLOBENZAPRINE HCL 10 MG PO TABS
5.0000 mg | ORAL_TABLET | Freq: Three times a day (TID) | ORAL | Status: DC | PRN
  Filled 2017-04-27: qty 1

## 2017-04-27 MED ORDER — ACETAMINOPHEN 325 MG PO TABS: 650.0000 mg | ORAL_TABLET | Freq: Four times a day (QID) | ORAL | Status: DC | PRN

## 2017-04-27 MED ORDER — HYDROMORPHONE HCL 1 MG/ML IJ SOLN
INTRAMUSCULAR | Status: AC
  Filled 2017-04-27: qty 2

## 2017-04-27 MED ORDER — ACETAMINOPHEN 650 MG RE SUPP: 650.0000 mg | Freq: Four times a day (QID) | RECTAL | Status: DC | PRN

## 2017-04-27 MED ORDER — METHOCARBAMOL 500 MG PO TABS
500.0000 mg | ORAL_TABLET | Freq: Three times a day (TID) | ORAL | Status: DC | PRN
  Administered 2017-04-27 – 2017-04-30 (×6): 500 mg via ORAL
  Filled 2017-04-27 (×7): qty 1

## 2017-04-27 NOTE — Care Management Note (Addendum)
Case Management Note  Patient Details  Name: Hunter [name] MRN: [id number] Date of Birth: 05-12-1986  Subjective/Objective:                  31 year old male with a history of IV drug use admitted with right hand infection. His blood cultures are growing Gram positive cocci in chains and BCID detected Streptococcus pyogenes. He is currently receiving Vancomycin and Zosyn. He states that he used IV Heroin the morning he came into the hospital.  Lives with girlfriend and her mother in [place]. States he moved here a month ago from [place]. Patient states he has no PCP or Insurance.      Action/Plan:  CM will continue to follow for PCP and medications at DC as needed.   Expected Discharge Date:                  Expected Discharge Plan:     In-House Referral:     Discharge planning Services  CM Consult  Post Acute Care Choice:    Choice offered to:     DME Arranged:    DME Agency:     HH Arranged:    HH Agency:     Status of Service:  In process, will continue to follow  If discussed at Long Length of Stay Meetings, dates discussed:    Additional Comments:  Lawerance SabalDebbie Ellan Tess, RN 04/27/2017, 3:50 PM

## 2017-04-27 NOTE — H&P (View-Only) (Signed)
Patient seen and examined at bedside, patient admitted after midnight, please see earlier detailed admission note by Madelyn Flavorsondell Smith, MD. Briefly, patient presented with sepsis secondary to hand abscess. He is s/p I&D and on broad spectrum antibiotics. Cultures pending. Hand surgery following.   Jacquelin Hawkingalph Leeandra Ellerson, MD [facility] 04/27/2017, 7:35 AM Pager: [phone]

## 2017-04-27 NOTE — Progress Notes (Signed)
  Echocardiogram 2D Echocardiogram has been performed.  Tye SavoyCasey N Huldah Marin 04/27/2017, 4:12 PM

## 2017-04-27 NOTE — H&P (Addendum)
History and Physical    [facility]ynnda ChildDavid [facility] UJW:[id number] DOB: 09/08/1985 DOA: 04/26/2017  Referring MD/NP/PA: Jacquelin Hawkingalph Nettey, MD PCP: Patient, No Pcp Per  [facility]: [facility]  Chief Complaint: Swelling of right arm  I have personally briefly reviewed patient's [facility]  HPI: Hunter MountsDavid Anthony [name] is a 31 y.o. male with medical history significant of IV drug abuse(heroin) and hepatitis C; who presented with complaints of right forearm pain and swelling.  Reported having drainage from the wound which started about 1 week ago.  Initially it seemed like symptoms were self resolving without any intervention.  However, overnight patient noted increased redness, swelling of the right forearm and hand, increased warmth around the area, and started developing purulent drainage.  Reports associated symptoms of fever.  Patient last reported last using heroin yesterday morning.  He reports using IV heroin for the last 10 years without any previous complications or issues in the past.   ED Course: [facility] initially noted to be febrile up to 103.2 F, heart rates 86-122, respirations 10-23, blood pressures noted to be excised 12/84, and O2 saturation maintained on room air.  Labs revealed WBC 15.5, hemoglobin 11.6, sodium 128, and lactic acid 1.8.  Xray  of the right hand showed no acute osseous sepsis protocol was initiated, cultures were obtained, and the patient was started empiric antibiotics of vancomycin and Zosyn.  Dr. Melvyn Novasrtmann of hand surgery was consulted to see the patient.  [facility] called to admit and patient was accepted to a telemetry bed.  Upon arrival patient went immediately to the operating room for I&D. Surgical pcr screen was positive for staph aureus  Review of Systems  Constitutional: Positive for fever and malaise/fatigue.  HENT: Negative for ear discharge and nosebleeds.   Eyes: Negative for pain and discharge.  Respiratory:  Positive for shortness of breath. Negative for hemoptysis.   Cardiovascular: Negative for chest pain and leg swelling.  Gastrointestinal: Negative for abdominal pain, diarrhea and vomiting.  Genitourinary: Negative for dysuria and frequency.  Musculoskeletal: Positive for back pain and myalgias.  Skin: Negative for itching and rash.  Neurological: Negative for focal weakness and seizures.  Psychiatric/Behavioral: Positive for substance abuse. Negative for hallucinations.    Past Medical History:  Diagnosis Date  . Hepatitis-C   . IVDU (intravenous drug user)     Past Surgical History:  Procedure Laterality Date  . TONSILLECTOMY       reports that he has been smoking.  he has never used smokeless tobacco. He reports that he uses drugs. Drug: IV. He reports that he does not drink alcohol.  No Known Allergies  History reviewed. No pertinent family history heart disease.  Prior to Admission medications   Not on File    Physical Exam:  Constitutional: Young male who appears to be distress writhing around on the hospital bed. Vitals:   04/27/17 0030 04/27/17 0045 04/27/17 0050 04/27/17 0125  BP: 126/90 (!) 212/84 129/79 127/78  Pulse: 93 93 88 90  Resp: 15 15 16 18   Temp:   97.7 F (36.5 C) 98.9 F (37.2 C)  TempSrc:    Oral  SpO2: 99% 99% 99% 100%  Weight:      Height:       Eyes: PERRL, lids and conjunctivae normal ENMT: Mucous membranes are moist. Posterior pharynx clear of any exudate or lesions. .  Neck: normal, supple, no masses, no thyromegaly Respiratory: clear to auscultation  bilaterally, no wheezing, no crackles. Normal respiratory effort. No accessory muscle use.  Cardiovascular: Regular rate and rhythm, 2 out of 6 systolic murmur, no rubs / gallops. No extremity edema. 2+ pedal pulses. No carotid bruits.  Abdomen: no tenderness, no masses palpated. No hepatosplenomegaly. Bowel sounds positive.  Musculoskeletal: no clubbing / cyanosis. No joint deformity  upper and lower extremities. Good ROM, no contractures. Normal muscle tone.  Skin: Erythema noted on the right forearm down to the hand as seen on previous imaging from ED notes, but currently wrapped. Neurologic: CN 2-12 grossly intact. Sensation intact, DTR normal. Strength 5/5 in all 4.  Psychiatric:  Alert and oriented x 3. Normal mood.     Labs on Admission: I have personally reviewed following labs and imaging studies  CBC: Recent Labs  Lab 04/26/17 1614  WBC 15.5*  NEUTROABS 11.6*  HGB 11.6*  HCT 34.7*  MCV 85.5  PLT 365   Basic Metabolic Panel: Recent Labs  Lab 04/26/17 1614  NA 128*  K 3.6  CL 94*  CO2 25  GLUCOSE 108*  BUN 14  CREATININE 0.87  CALCIUM 8.8*   GFR: Estimated Creatinine Clearance: 134.2 mL/min (by C-G formula based on SCr of 0.87 mg/dL). Liver Function Tests: Recent Labs  Lab 04/26/17 1614  AST 27  ALT 25  ALKPHOS 100  BILITOT 0.8  PROT 8.3*  ALBUMIN 3.6   No results for input(s): LIPASE, AMYLASE in the last 168 hours. No results for input(s): AMMONIA in the last 168 hours. Coagulation Profile: No results for input(s): INR, PROTIME in the last 168 hours. Cardiac Enzymes: No results for input(s): CKTOTAL, CKMB, CKMBINDEX, TROPONINI in the last 168 hours. BNP (last 3 results) No results for input(s): PROBNP in the last 8760 hours. HbA1C: No results for input(s): HGBA1C in the last 72 hours. CBG: No results for input(s): GLUCAP in the last 168 hours. Lipid Profile: No results for input(s): CHOL, HDL, LDLCALC, TRIG, CHOLHDL, LDLDIRECT in the last 72 hours. Thyroid Function Tests: No results for input(s): TSH, T4TOTAL, FREET4, T3FREE, THYROIDAB in the last 72 hours. Anemia Panel: No results for input(s): VITAMINB12, FOLATE, FERRITIN, TIBC, IRON, RETICCTPCT in the last 72 hours. Urine analysis:    Component Value Date/Time   COLORURINE YELLOW 04/26/2017 1840   APPEARANCEUR CLEAR 04/26/2017 1840   LABSPEC 1.010 04/26/2017 1840    PHURINE 6.0 04/26/2017 1840   GLUCOSEU NEGATIVE 04/26/2017 1840   HGBUR NEGATIVE 04/26/2017 1840   BILIRUBINUR NEGATIVE 04/26/2017 1840   KETONESUR NEGATIVE 04/26/2017 1840   PROTEINUR NEGATIVE 04/26/2017 1840   NITRITE NEGATIVE 04/26/2017 1840   LEUKOCYTESUR NEGATIVE 04/26/2017 1840   Sepsis Labs: Recent Results (from the past 240 hour(s))  Wound or Superficial Culture     Status: None (Preliminary result)   Collection Time: 04/26/17  4:30 PM  Result Value Ref Range Status   Specimen Description WOUND RIGHT HAND  Final   Special Requests Normal  Final   Gram Stain   Final    NO WBC SEEN FEW GRAM POSITIVE COCCI Performed at [facility], [address] [place], [place] [address]    Culture PENDING  Incomplete   Report Status PENDING  Incomplete  Surgical pcr screen     Status: Abnormal   Collection Time: 04/26/17  9:52 PM  Result Value Ref Range Status   MRSA, PCR NEGATIVE NEGATIVE Final   Staphylococcus aureus POSITIVE (A) NEGATIVE Final    Comment: (NOTE) The Xpert SA Assay (FDA approved for  NASAL specimens in patients 31 years of age and older), is one component of a comprehensive surveillance program. It is not intended to diagnose infection nor to guide or monitor treatment.   Aerobic/Anaerobic Culture (surgical/deep wound)     Status: None (Preliminary result)   Collection Time: 04/26/17 11:17 PM  Result Value Ref Range Status   Specimen Description ABSCESS RIGHT HAND  Final   Special Requests PATIENT ON FOLLOWING ZOSYN AND VANC  Final   Gram Stain   Final    ABUNDANT WBC PRESENT, PREDOMINANTLY PMN RARE GRAM POSITIVE COCCI    Culture PENDING  Incomplete   Report Status PENDING  Incomplete     Radiological Exams on Admission: Dg Chest Port 1 View  Result Date: 04/26/2017 CLINICAL DATA:  Fever.  Right hand swelling. EXAM: PORTABLE CHEST 1 VIEW COMPARISON:  None. FINDINGS: The cardiomediastinal silhouette is within normal limits. The bronchovascular  markings are minimally prominent bilaterally without evidence of lobar consolidation, overt edema, sizable pleural effusion, or pneumothorax. No acute osseous abnormality is seen. IMPRESSION: No evidence of pneumonia. Electronically Signed   By: Sebastian AcheAllen  Grady M.D.   On: 04/26/2017 17:38   Dg Hand Complete Right  Result Date: 04/26/2017 CLINICAL DATA:  Right hand swelling EXAM: RIGHT HAND - COMPLETE 3+ VIEW COMPARISON:  None. FINDINGS: There is severe soft tissue swelling of the right hand. The bones are normal. No soft tissue emphysema. IMPRESSION: Severe right hand soft tissue swelling without osseous abnormality. Electronically Signed   By: Deatra RobinsonKevin  Herman M.D.   On: 04/26/2017 17:37    Chest x-ray: Independently reviewed.  No acute abnormalities noted  Assessment/Plan Sepsis 2/2 cellulitis and abscess of the right forearm: Acute.  Patient with history of IV drug abuse with acute swelling of the right forearm and and hand.  Dr. Luz BrazenHartman of m hand surgery consulted and took patient to the operating room for I&D. - Admit to a telemetry bed - Follow-up blood and wound cultures - Hydrocodone po/  IV Dilaudid prn moderate/ severe pain - Continue empiric antibiotics of vancomycin and Zosyn de-escalate when medically appropriate - Appreciate Dr. Melvyn Novasrtmann, will follow-up for any further recommendations  Normocytic normochromic anemia: Hemoglobin noted to be 11.6 on admission.  Patient denies any reports of bleeding. - Recheck CBC in a.m.  Hyponatremia: Acute.  Initial sodium noted to be 128 on admission. - IVFs of NS at 100 ml/h - Check BMP in a.m.  IV drug abuse: He reports a 10-year history of IV drug use of heroin and last report using yesterday morning. - Follow-up HIV screening  - check echo given murmur - Social work consult  H/O hepatitis C  DVT prophylaxis: lovenox Code Status: full  Family Communication: none  Disposition Plan: TBD Consults called: Hand surgery Admission status:  Inpatient   Clydie Braunondell A Smith MD [facility] Pager [phone]   If 7PM-7AM, please contact night-coverage [email or web address] Password [email or web address]  04/27/2017, 3:11 AM

## 2017-04-27 NOTE — Progress Notes (Signed)
Patient off floor for echo

## 2017-04-27 NOTE — Anesthesia Postprocedure Evaluation (Addendum)
Anesthesia Post Note  Patient: Hunter [name]  Procedure(s) Performed: IRRIGATION AND DEBRIDEMENT RIGHT HAND AND FOREARM (Right Arm Lower)     Patient location during evaluation: PACU Anesthesia Type: General Level of consciousness: awake and alert Pain management: pain level controlled Vital Signs Assessment: post-procedure vital signs reviewed and stable Respiratory status: spontaneous breathing, nonlabored ventilation, respiratory function stable and patient connected to nasal cannula oxygen Cardiovascular status: blood pressure returned to baseline and stable Postop Assessment: no apparent nausea or vomiting Anesthetic complications: no    Last Vitals:  Vitals:   04/27/17 0045 04/27/17 0050  BP: (!) 212/84 129/79  Pulse: 93 88  Resp: 15 16  Temp:  36.5 C  SpO2: 99% 99%    Last Pain:  Vitals:   04/27/17 0050  TempSrc:   PainSc: 2                  Kennieth RadFitzgerald, Arnice Vanepps E

## 2017-04-27 NOTE — Progress Notes (Signed)
Patient seen and examined at bedside, patient admitted after midnight, please see earlier detailed admission note by Rondell Smith, MD. Briefly, patient presented with sepsis secondary to hand abscess. He is s/p I&D and on broad spectrum antibiotics. Cultures pending. Hand surgery following.   Ralph Nettey, MD [facility] 04/27/2017, 7:35 AM Pager: [phone] 

## 2017-04-27 NOTE — Progress Notes (Signed)
PHARMACY - PHYSICIAN COMMUNICATION CRITICAL VALUE ALERT - BLOOD CULTURE IDENTIFICATION (BCID)  Results for orders placed or performed during the hospital encounter of 04/26/17  Blood Culture ID Panel (Reflexed) (Collected: 04/26/2017  4:15 PM)  Result Value Ref Range   Enterococcus species NOT DETECTED NOT DETECTED   Listeria monocytogenes NOT DETECTED NOT DETECTED   Staphylococcus species NOT DETECTED NOT DETECTED   Staphylococcus aureus NOT DETECTED NOT DETECTED   Streptococcus species DETECTED (A) NOT DETECTED   Streptococcus agalactiae NOT DETECTED NOT DETECTED   Streptococcus pneumoniae NOT DETECTED NOT DETECTED   Streptococcus pyogenes DETECTED (A) NOT DETECTED   Acinetobacter baumannii NOT DETECTED NOT DETECTED   Enterobacteriaceae species NOT DETECTED NOT DETECTED   Enterobacter cloacae complex NOT DETECTED NOT DETECTED   Escherichia coli NOT DETECTED NOT DETECTED   Klebsiella oxytoca NOT DETECTED NOT DETECTED   Klebsiella pneumoniae NOT DETECTED NOT DETECTED   Proteus species NOT DETECTED NOT DETECTED   Serratia marcescens NOT DETECTED NOT DETECTED   Haemophilus influenzae NOT DETECTED NOT DETECTED   Neisseria meningitidis NOT DETECTED NOT DETECTED   Pseudomonas aeruginosa NOT DETECTED NOT DETECTED   Candida albicans NOT DETECTED NOT DETECTED   Candida glabrata NOT DETECTED NOT DETECTED   Candida krusei NOT DETECTED NOT DETECTED   Candida parapsilosis NOT DETECTED NOT DETECTED   Candida tropicalis NOT DETECTED NOT DETECTED   31 year old male with a history of IV drug use admitted with right hand infection. His blood cultures are growing Gram positive cocci in chains and BCID detected Streptococcus pyogenes. He is currently receiving Vancomycin and Zosyn.  Name of physician (or Provider) Contacted: Dr. Caleb PoppNettey  Changes to prescribed antibiotics required: Change Vanc and Zosyn to Penicillin G and Clindamycin  Sallee Provencalurner, Lilac Hoff S 04/27/2017  10:12 AM

## 2017-04-28 ENCOUNTER — Encounter (HOSPITAL_COMMUNITY): Payer: Self-pay | Admitting: Family Medicine

## 2017-04-28 DIAGNOSIS — R7881 Bacteremia: Secondary | ICD-10-CM

## 2017-04-28 DIAGNOSIS — B955 Unspecified streptococcus as the cause of diseases classified elsewhere: Secondary | ICD-10-CM

## 2017-04-28 DIAGNOSIS — D649 Anemia, unspecified: Secondary | ICD-10-CM

## 2017-04-28 DIAGNOSIS — B182 Chronic viral hepatitis C: Secondary | ICD-10-CM

## 2017-04-28 HISTORY — DX: Chronic viral hepatitis C: B18.2

## 2017-04-28 LAB — HEPATITIS PANEL, ACUTE
HCV Ab: >11 s/co ratio — ABNORMAL HIGH (ref 0.0–0.9)
HEP A IGM: NEGATIVE
Hep B C IgM: NEGATIVE
Hepatitis B Surface Ag: NEGATIVE

## 2017-04-28 MED ORDER — INFLUENZA VAC SPLIT QUAD 0.5 ML IM SUSY
0.5000 mL | PREFILLED_SYRINGE | INTRAMUSCULAR | Status: AC
  Administered 2017-04-29: 0.5 mL via INTRAMUSCULAR
  Filled 2017-04-28: qty 0.5

## 2017-04-28 NOTE — Progress Notes (Addendum)
  PROGRESS NOTE  [name] GEX:[id number] DOB: [birth year] DOA: 04/26/2017 PCP: Patient, No Pcp Per  Brief Narrative: 31yom PMH ongoing IVDU with heroin presented with right forearm and hand pain and swelling.  Assessment/Plan Right forearm deep space abscess, right wrist extensor tenosynovitis s/p heroin IV drug use. S/p surgery 11/19. - afebrile 24 hours, wound culture pending, S pyogenes and S aureus. Continue empiric abx, f/u sensitivities  - f/u with ortho 2 weeks (about 12/4) -patient did not meet criteria for sepsis on admission  Strep pyogenes bacteremia secondary to right forearm infection - continue empiric abx, f/u sensitivities  - TEE to r/o endocarditis  IVDU/heroin - monitor for withdrawal - HIV non-reactive  Normocytic anemia - follow CBC in AM  Hyponatremia secondary to dehydration - improving with IVF.   Hepatitis C  DVT prophylaxis: enoxaparin Code Status: full Family Communication: none Disposition Plan: home    [name], MD  [facility] Direct contact: [phone] --Via amion app OR  --[email or web address]; password [email or web address]  7PM-7AM contact night coverage as above 04/28/2017, 3:04 PM  LOS: 2 days   Consultants:  Hand surgery   Procedures:   OPERATIVE PROCEDURE: #1: Right forearm drainage deep space abscess #2: Right hand drainage deep space abscess to include the hand and wrist #3: Right wrist extensor tenosynovectomy fourth dorsal compartment #4 right wrist second dorsal compartment extensor tenosynovectomy #5: Right thumb EPL extensor tenosynovectomy   Echo Study Conclusions  - Left ventricle: The cavity size was normal. Systolic function was   normal. Wall motion was normal; there were no regional wall   motion abnormalities.  Impressions:  - There was no evidence of a vegetation.  Recommendations:  Consider transesophageal echocardiography if clinically indicated in order to exclude  endocarditis.  Antimicrobials:  Clindamycin 11/20 >>  PCN G 11/20 >>  Interval history/Subjective: Right hand is sore.  Objective: Vitals:  Vitals:   04/28/17 0500 04/28/17 1500  BP: 125/78 123/77  Pulse: 78 74  Resp: 18 18  Temp: 98.4 F (36.9 C) 98.7 F (37.1 C)  SpO2: 96% 99%    Exam:  Constitutional:  . Appears calm, mildly uncomfortable Respiratory:  . CTA bilaterally, no w/r/r.  . Respiratory effort normal.  Cardiovascular:  . RRR, no m/r/g . No LE extremity edema   Musculoskeletal:  . Digits/nails BUE: right hand edematous Moves both legs Skin:  . No rashes, lesions, ulcers noted but right hand and forearm dressed . palpation of skin: no induration or nodules Psychiatric:  . Mental status o Mood, affect appropriate . judgement and insight appear normal    I have personally reviewed the following:   Labs:  Na 128 >> 133  WBC 15.5 >> 13.3  Hgb 11.6 >> 9.8  Scheduled Meds: . enoxaparin (LOVENOX) injection  40 mg Subcutaneous Q24H   Continuous Infusions: . clindamycin (CLEOCIN) IV Stopped (04/28/17 1449)  . pencillin G potassium IV Stopped (04/28/17 1342)    Principal Problem:   Abscess of forearm Active Problems:   Cellulitis   Hyponatremia   IVDU (intravenous drug user)   Streptococcal bacteremia   Normocytic anemia   Chronic hepatitis C without hepatic coma (HCC)   LOS: 2 days

## 2017-04-28 NOTE — Progress Notes (Addendum)
    [facility] has been requested to perform a transesophageal echocardiogram on Hunter [name] for bacteremia.  After careful review of history and examination, the risks and benefits of transesophageal echocardiogram have been explained including risks of esophageal damage, perforation (1:10,000 risk), bleeding, pharyngeal hematoma as well as other potential complications associated with conscious sedation including aspiration, arrhythmia, respiratory failure and death. Alternatives to treatment were discussed, questions were answered. Patient is willing to proceed.   Pt is scheduled for TEE with Dr. Mayford Knifeurner on Friday 04/30/17 at 1300. NPO Thurs night.  Hunter [name], [place]  04/28/2017 2:31 PM

## 2017-04-28 NOTE — Progress Notes (Addendum)
  The patient was seen this morning for a wound check. His post-operative dressing was removed and the incisions appeared clean with minimal drainage.The sutures were intact. The patient will not need a repeat incision and drainage of the arm in the operating room at this point.   A new dressing was put over the wound and the short arm volar splint was put back on. Advised him to continue with the splint until he comes back to the office to see us. The patient will need to be seen in our office for a post-operative check in 2 weeks. Continue with the current treatment plan.  Dr Melvyn Novasrtmann will be out of town for the holiday weekend. If there are any further questions or concerns regarding this patient, he may be reached directly on his cell phone #[phone].  Please do not contact the hand surgeon on call.

## 2017-04-29 ENCOUNTER — Other Ambulatory Visit: Payer: Self-pay

## 2017-04-29 LAB — BASIC METABOLIC PANEL WITH GFR
Anion gap: 8 (ref 5–15)
BUN: 8 mg/dL (ref 6–20)
CO2: 26 mmol/L (ref 22–32)
Calcium: 8.8 mg/dL — ABNORMAL LOW (ref 8.9–10.3)
Chloride: 104 mmol/L (ref 101–111)
Creatinine, Ser: 0.74 mg/dL (ref 0.61–1.24)
GFR calc Af Amer: >60 mL/min
GFR calc non Af Amer: >60 mL/min
Glucose, Bld: 107 mg/dL — ABNORMAL HIGH (ref 65–99)
Potassium: 4.1 mmol/L (ref 3.5–5.1)
Sodium: 138 mmol/L (ref 135–145)

## 2017-04-29 LAB — CULTURE, BLOOD (ROUTINE X 2): SPECIAL REQUESTS: ADEQUATE

## 2017-04-29 LAB — PROTIME-INR
INR: 1.14
Prothrombin Time: 14.5 s (ref 11.4–15.2)

## 2017-04-29 LAB — AEROBIC CULTURE W GRAM STAIN (SUPERFICIAL SPECIMEN)
Gram Stain: NONE SEEN
Special Requests: NORMAL

## 2017-04-29 LAB — AEROBIC CULTURE  (SUPERFICIAL SPECIMEN)

## 2017-04-29 MED ORDER — POLYETHYLENE GLYCOL 3350 17 G PO PACK
17.0000 g | PACK | Freq: Two times a day (BID) | ORAL | Status: DC
  Administered 2017-04-29 – 2017-05-01 (×2): 17 g via ORAL
  Filled 2017-04-29 (×2): qty 1

## 2017-04-29 MED ORDER — BISACODYL 10 MG RE SUPP: 10.0000 mg | Freq: Every day | RECTAL | Status: DC | PRN

## 2017-04-29 MED ORDER — SENNA 8.6 MG PO TABS
1.0000 | ORAL_TABLET | Freq: Every day | ORAL | Status: DC
  Administered 2017-04-29: 8.6 mg via ORAL
  Filled 2017-04-29: qty 1

## 2017-04-29 MED ORDER — SODIUM CHLORIDE 0.9 % IV SOLN
INTRAVENOUS | Status: DC
  Administered 2017-04-29: via INTRAVENOUS

## 2017-04-29 NOTE — Progress Notes (Addendum)
  PROGRESS NOTE  [facility] ZOX:[id number] DOB: 12-05-1985 DOA: 04/26/2017 PCP: Patient, No Pcp Per  Brief Narrative: 31yom PMH ongoing IVDU with heroin presented with right forearm and hand pain and swelling.  Assessment/Plan Right forearm deep space abscess, right wrist extensor tenosynovitis s/p heroin IV drug use. S/p surgery 11/19. Patient did not meet criteria for sepsis on admission - afebrile 48 hours, wound culture pending 11/22, S pyogenes and S aureus. Continue empiric abx, f/u sensitivities 11/22 - f/u with ortho 2 weeks (about 12/4)  Strep pyogenes bacteremia secondary to right forearm infection - continue empiric abx, f/u sensitivities 11/22 - TEE to r/o endocarditis 11/23  IVDU/heroin. HIV non-reactive - monitor for withdrawal (none apparent)  Normocytic anemia - follow CBC in AM 11/23  Hyponatremia secondary to dehydration - improving with IVF. Check BMP 11/23   Hepatitis C  DVT prophylaxis: enoxaparin Code Status: full Family Communication: none Disposition Plan: home    Brendia Sacksaniel Yariana Hoaglund, MD  [facility] Direct contact: [phone] --Via amion app OR  --[email or web address]; password [email or web address]  7PM-7AM contact night coverage as above 04/29/2017, 11:15 AM  LOS: 3 days   Consultants:  Hand surgery   Procedures:   OPERATIVE PROCEDURE: #1: Right forearm drainage deep space abscess #2: Right hand drainage deep space abscess to include the hand and wrist #3: Right wrist extensor tenosynovectomy fourth dorsal compartment #4 right wrist second dorsal compartment extensor tenosynovectomy #5: Right thumb EPL extensor tenosynovectomy   Echo Study Conclusions  - Left ventricle: The cavity size was normal. Systolic function was   normal. Wall motion was normal; there were no regional wall   motion abnormalities.  Impressions:  - There was no evidence of a vegetation.  Recommendations:  Consider transesophageal echocardiography  if clinically indicated in order to exclude endocarditis.  Antimicrobials:  Clindamycin 11/20 >>  PCN G 11/20 >>  Interval history/Subjective: Feels about the same as yesterday in regard to right hand.  Objective: Vitals:  Vitals:   04/28/17 2353 04/29/17 0605  BP: 126/81 129/81  Pulse: 81 84  Resp: 17 18  Temp: 99.1 F (37.3 C) 98.6 F (37 C)  SpO2: 99% 98%    Exam:  Constitutional:   . Appears calm and mildly uncomfortable Respiratory:  . CTA bilaterally, no w/r/r.  . Respiratory effort normal.  Cardiovascular:  . RRR, no m/r/g Musculoskeletal:  . RUE extremity covered but fingers appear well-perfused and movement appears intact Psychiatric:  . Mental status o Mood, affect appropriate . judgement and insight appear normal     I have personally reviewed the following:   Labs:  No labs  Scheduled Meds: . enoxaparin (LOVENOX) injection  40 mg Subcutaneous Q24H  . Influenza vac split quadrivalent PF  0.5 mL Intramuscular Tomorrow-1000   Continuous Infusions: . clindamycin (CLEOCIN) IV Stopped (04/29/17 0710)  . pencillin G potassium IV Stopped (04/29/17 1000)    Principal Problem:   Abscess of forearm Active Problems:   Cellulitis   Hyponatremia   IVDU (intravenous drug user)   Streptococcal bacteremia   Normocytic anemia   Chronic hepatitis C without hepatic coma (HCC)   LOS: 3 days

## 2017-04-29 NOTE — Progress Notes (Signed)
Notified MD Irene LimboGoodrich that lab was unable to obtain blood draw and they are requesting a foot draw- Also notified that pt has no had BM since 11/20 and opt was requesting stool softener. Awaiting orders.

## 2017-04-30 ENCOUNTER — Encounter (HOSPITAL_COMMUNITY)
Admission: EM | Disposition: A | Discharge status: Home / Self Care | Payer: Self-pay | Source: Home / Self Care | Attending: Family Medicine

## 2017-04-30 ENCOUNTER — Inpatient Hospital Stay (HOSPITAL_COMMUNITY): Payer: Self-pay

## 2017-04-30 ENCOUNTER — Inpatient Hospital Stay (HOSPITAL_COMMUNITY): Payer: Self-pay | Admitting: Anesthesiology

## 2017-04-30 ENCOUNTER — Encounter (HOSPITAL_COMMUNITY): Payer: Self-pay | Admitting: *Deleted

## 2017-04-30 DIAGNOSIS — I340 Nonrheumatic mitral (valve) insufficiency: Secondary | ICD-10-CM

## 2017-04-30 HISTORY — PX: TEE WITHOUT CARDIOVERSION: SHX5443

## 2017-04-30 LAB — BASIC METABOLIC PANEL
Anion gap: 8 (ref 5–15)
BUN: 7 mg/dL (ref 6–20)
CALCIUM: 9 mg/dL (ref 8.9–10.3)
CHLORIDE: 101 mmol/L (ref 101–111)
CO2: 27 mmol/L (ref 22–32)
CREATININE: 0.68 mg/dL (ref 0.61–1.24)
GFR calc Af Amer: >60 mL/min (ref >60)
GFR calc non Af Amer: >60 mL/min (ref >60)
Glucose, Bld: 99 mg/dL (ref 65–99)
Potassium: 4.4 mmol/L (ref 3.5–5.1)
SODIUM: 136 mmol/L (ref 135–145)

## 2017-04-30 LAB — CBC
HCT: 34.2 % — ABNORMAL LOW (ref 39.0–52.0)
HEMOGLOBIN: 11.1 g/dL — AB (ref 13.0–17.0)
MCH: 28.4 pg (ref 26.0–34.0)
MCHC: 32.5 g/dL (ref 30.0–36.0)
MCV: 87.5 fL (ref 78.0–100.0)
Platelets: 399 10*3/uL (ref 150–400)
RBC: 3.91 MIL/uL — ABNORMAL LOW (ref 4.22–5.81)
RDW: 13 % (ref 11.5–15.5)
WBC: 7.1 10*3/uL (ref 4.0–10.5)

## 2017-04-30 SURGERY — ECHOCARDIOGRAM, TRANSESOPHAGEAL
Anesthesia: Monitor Anesthesia Care

## 2017-04-30 MED ORDER — PROPOFOL 10 MG/ML IV BOLUS
INTRAVENOUS | Status: DC | PRN
  Administered 2017-04-30 (×2): 20 mg via INTRAVENOUS

## 2017-04-30 MED ORDER — DEXMEDETOMIDINE HCL 200 MCG/2ML IV SOLN
INTRAVENOUS | Status: DC | PRN
  Administered 2017-04-30 (×7): 8 ug via INTRAVENOUS
  Administered 2017-04-30: 12 ug via INTRAVENOUS

## 2017-04-30 MED ORDER — CEFAZOLIN SODIUM-DEXTROSE 2-4 GM/100ML-% IV SOLN
2.0000 g | Freq: Three times a day (TID) | INTRAVENOUS | Status: DC
  Administered 2017-04-30 – 2017-05-01 (×2): 2 g via INTRAVENOUS
  Filled 2017-04-30 (×3): qty 100

## 2017-04-30 MED ORDER — PROPOFOL 500 MG/50ML IV EMUL
INTRAVENOUS | Status: DC | PRN
  Administered 2017-04-30: 100 ug/kg/min via INTRAVENOUS

## 2017-04-30 MED ORDER — BUTAMBEN-TETRACAINE-BENZOCAINE 2-2-14 % EX AERO
INHALATION_SPRAY | CUTANEOUS | Status: DC | PRN
  Administered 2017-04-30: 2 via TOPICAL

## 2017-04-30 MED ORDER — LIDOCAINE HCL (CARDIAC) 20 MG/ML IV SOLN
INTRAVENOUS | Status: DC | PRN
  Administered 2017-04-30: 60 mg via INTRATRACHEAL

## 2017-04-30 NOTE — Progress Notes (Addendum)
  PROGRESS NOTE  [name] DO[facility]: [birth year] DOA: 04/26/2017 PCP: Patient, No Pcp Per  Brief Narrative: 31yom PMH ongoing IVDU with heroin presented with right forearm and hand pain and swelling.  Assessment/Plan Right forearm deep space abscess, right wrist extensor tenosynovitis s/p heroin IV drug use. S/p surgery 11/19. Patient did not meet criteria for sepsis on admission. No further surgery planned. - afebrile 72 hours, wound culture 11/22, S pyogenes and MSSA. - narrow to cefazolin - f/u with ortho 2 weeks (about 12/4)  Strep pyogenes bacteremia secondary to right forearm infection - TEE negative for vegetation - culture data final. Narrow to cefazolin  IVDU/heroin. HIV non-reactive - monitor for withdrawal (none apparent)  Normocytic anemia - stable  Hyponatremia secondary to dehydration - resolved  Hepatitis C  Discussed with Dr. Ninetta LightsHatcher, ID, rec change to Augmentin on discharge and treat for 4 weeks.  DVT prophylaxis: enoxaparin Code Status: full Family Communication: none Disposition Plan: home    Hunter Sacksaniel Everson Mott, MD  [facility] Direct contact: [phone] --Via amion app OR  --[email or web address]; password [email or web address]  7PM-7AM contact night coverage as above 04/30/2017, 3:53 PM  LOS: 4 days   Consultants:  Hand surgery   Procedures:   OPERATIVE PROCEDURE: #1: Right forearm drainage deep space abscess #2: Right hand drainage deep space abscess to include the hand and wrist #3: Right wrist extensor tenosynovectomy fourth dorsal compartment #4 right wrist second dorsal compartment extensor tenosynovectomy #5: Right thumb EPL extensor tenosynovectomy   Echo Study Conclusions  - Left ventricle: The cavity size was normal. Systolic function was   normal. Wall motion was normal; there were no regional wall   motion abnormalities.  Impressions:  - There was no evidence of a vegetation.  Recommendations:  Consider  transesophageal echocardiography if clinically indicated in order to exclude endocarditis.  Antimicrobials:  Clindamycin 11/20 >> 11/23  PCN G 11/20 >> 11/23  Cefazolin 11/23 >>  Interval history/Subjective: Right arm is sore.  Objective: Vitals:  Vitals:   04/30/17 1010 04/30/17 1230  BP: (!) 112/56 (!) 148/60  Pulse: 64 66  Resp: 12 14  Temp:  98.2 F (36.8 C)  SpO2: 96% 99%    Exam:  Constitutional:   . Appears calm and comfortable Respiratory:  . CTA bilaterally, no w/r/r.  . Respiratory effort normal. Cardiovascular:  . RRR, no m/r/g Musculoskeletal:  . Right forearm with splint and dressing. Hand with edema, but appears well perfused. Psychiatric:  . Mental status o Mood, affect appropriate  I have personally reviewed the following:    BMP unremarkable  Hgb stable 11.1  TEE negative for vegetation  Scheduled Meds: . enoxaparin (LOVENOX) injection  40 mg Subcutaneous Q24H  . polyethylene glycol  17 g Oral BID  . senna  1 tablet Oral QHS   Continuous Infusions: .  ceFAZolin (ANCEF) IV      Principal Problem:   Abscess of forearm Active Problems:   Cellulitis   Hyponatremia   IVDU (intravenous drug user)   Streptococcal bacteremia   Normocytic anemia   Chronic hepatitis C without hepatic coma (HCC)   LOS: 4 days

## 2017-04-30 NOTE — Anesthesia Postprocedure Evaluation (Signed)
Anesthesia Post Note  Patient: Hunter [name]  Procedure(s) Performed: TRANSESOPHAGEAL ECHOCARDIOGRAM (TEE) (N/A )     Patient location during evaluation: PACU Anesthesia Type: MAC Level of consciousness: awake and alert Pain management: pain level controlled Vital Signs Assessment: post-procedure vital signs reviewed and stable Respiratory status: spontaneous breathing, nonlabored ventilation, respiratory function stable and patient connected to nasal cannula oxygen Cardiovascular status: stable and blood pressure returned to baseline Postop Assessment: no apparent nausea or vomiting Anesthetic complications: no    Last Vitals:  Vitals:   04/30/17 1005 04/30/17 1010  BP: (!) 113/56 (!) 112/56  Pulse: 63 64  Resp: 10 12  Temp: (!) 36.4 C   SpO2: 97% 96%    Last Pain:  Vitals:   04/30/17 1005  TempSrc: Oral  PainSc:                  Garv Kuechle S

## 2017-04-30 NOTE — Anesthesia Preprocedure Evaluation (Signed)
Anesthesia Evaluation  Patient identified by MRN, date of birth, ID band Patient awake    Reviewed: Allergy & Precautions, NPO status , Patient's Chart, lab work & pertinent test results  Airway Mallampati: II  TM Distance: >3 FB Neck ROM: Full    Dental no notable dental hx.    Pulmonary neg pulmonary ROS, Current Smoker,    Pulmonary exam normal breath sounds clear to auscultation       Cardiovascular negative cardio ROS Normal cardiovascular exam Rhythm:Regular Rate:Normal     Neuro/Psych negative neurological ROS  negative psych ROS   GI/Hepatic negative GI ROS, (+)     substance abuse  IV drug use, Hepatitis -, C  Endo/Other  negative endocrine ROS  Renal/GU negative Renal ROS  negative genitourinary   Musculoskeletal negative musculoskeletal ROS (+)   Abdominal   Peds negative pediatric ROS (+)  Hematology negative hematology ROS (+)   Anesthesia Other Findings   Reproductive/Obstetrics negative OB ROS                             Anesthesia Physical Anesthesia Plan  ASA: III  Anesthesia Plan: MAC   Post-op Pain Management:    Induction: Intravenous  PONV Risk Score and Plan:   Airway Management Planned: Simple Face Mask  Additional Equipment:   Intra-op Plan:   Post-operative Plan:   Informed Consent: I have reviewed the patients History and Physical, chart, labs and discussed the procedure including the risks, benefits and alternatives for the proposed anesthesia with the patient or authorized representative who has indicated his/her understanding and acceptance.   Dental advisory given  Plan Discussed with: CRNA and Surgeon  Anesthesia Plan Comments:         Anesthesia Quick Evaluation

## 2017-04-30 NOTE — Progress Notes (Signed)
Pharmacy states RN can hang penicillin IVPB labeled 1200 due to night shift RN hanging IVPB with 0800 label.

## 2017-04-30 NOTE — Progress Notes (Signed)
  Echocardiogram Echocardiogram Transesophageal has been performed.  Celene SkeenVijay  Lorin Gawron 04/30/2017, 12:01 PM

## 2017-04-30 NOTE — Transfer of Care (Signed)
Immediate Anesthesia Transfer of Care Note  Patient: Hunter [name]  Procedure(s) Performed: TRANSESOPHAGEAL ECHOCARDIOGRAM (TEE) (N/A )  Patient Location: Endoscopy Unit  Anesthesia Type:MAC  Level of Consciousness: awake, alert  and oriented  Airway & Oxygen Therapy: Patient Spontanous Breathing and Patient connected to nasal cannula oxygen  Post-op Assessment: Report given to RN and Post -op Vital signs reviewed and stable  Post vital signs: Reviewed and stable  Last Vitals:  Vitals:   04/30/17 0627 04/30/17 0906  BP: 109/64 118/81  Pulse: 63   Resp: 16 12  Temp: 36.6 C 36.7 C  SpO2: 100% 100%    Last Pain:  Vitals:   04/30/17 0906  TempSrc: Oral  PainSc:       Patients Stated Pain Goal: 3 (17/91/50 5697)  Complications: No apparent anesthesia complications

## 2017-04-30 NOTE — Interval H&P Note (Addendum)
Please cosign corrected note in order to complete chart correction case 

## 2017-04-30 NOTE — CV Procedure (Signed)
    PROCEDURE NOTE:  Procedure:  Transesophageal echocardiogram Operator:  Armanda Magicraci Jaziya Obarr, MD Indications:  Bacteremia Complications: None  During this procedure the patient is administered a total of Propofol 300 mg and Precedex 65 mg to achieve and maintain moderate conscious sedation.  The patient's heart rate, blood pressure, and oxygen saturation are monitored continuously during the procedure by anesthesia.   Results: Normal LV size and function Normal RV size and function Normal RA Normal LA Normal TV with mild TR Normal PV with trivial PR Normal MV with mild MR Normal trileaflet AV Normal interatrial septum with no evidence of shunt by colorflow dopper  Normal thoracic and ascending aorta. No evidence of vegetation  The patient tolerated the procedure well and was transferred back to their room in stable condition.  Signed: Armanda Magicraci Douglas Rooks, MD [facility]

## 2017-05-01 ENCOUNTER — Encounter (HOSPITAL_COMMUNITY): Payer: Self-pay | Admitting: Cardiology

## 2017-05-01 DIAGNOSIS — B182 Chronic viral hepatitis C: Secondary | ICD-10-CM

## 2017-05-01 MED ORDER — AMOXICILLIN-POT CLAVULANATE 875-125 MG PO TABS: 1.0000 | ORAL_TABLET | Freq: Two times a day (BID) | ORAL | 0 refills | Status: DC

## 2017-05-01 MED ORDER — ACETAMINOPHEN 325 MG PO TABS: 650.0000 mg | ORAL_TABLET | Freq: Four times a day (QID) | ORAL | Status: AC | PRN

## 2017-05-01 NOTE — Plan of Care (Signed)
Progressing Education: Knowledge of General Education information will improve 05/01/2017 1139 - Progressing by Darreld Mcleanox, Kasheem Toner, RN Health Behavior/Discharge Planning: Ability to manage health-related needs will improve 05/01/2017 1139 - Progressing by Darreld Mcleanox, Thailyn Khalid, RN Clinical Measurements: Ability to maintain clinical measurements within normal limits will improve 05/01/2017 1139 - Progressing by Darreld Mcleanox, Wanell Lorenzi, RN Will remain free from infection 05/01/2017 1139 - Progressing by Darreld Mcleanox, Mardell Suttles, RN Diagnostic test results will improve 05/01/2017 1139 - Progressing by Darreld Mcleanox, Ossie Beltran, RN Respiratory complications will improve 05/01/2017 1139 - Progressing by Darreld Mcleanox, Plumer Mittelstaedt, RN Cardiovascular complication will be avoided 05/01/2017 1139 - Progressing by Darreld Mcleanox, Raziyah Vanvleck, RN Activity: Risk for activity intolerance will decrease 05/01/2017 1139 - Progressing by Darreld Mcleanox, Kwana Ringel, RN Nutrition: Adequate nutrition will be maintained 05/01/2017 1139 - Progressing by Darreld Mcleanox, Shamar Kracke, RN Coping: Level of anxiety will decrease 05/01/2017 1139 - Progressing by Darreld Mcleanox, Kron Everton, RN Elimination: Will not experience complications related to bowel motility 05/01/2017 1139 - Progressing by Darreld Mcleanox, Kayleann Mccaffery, RN Will not experience complications related to urinary retention 05/01/2017 1139 - Progressing by Darreld Mcleanox, Jamarius Saha, RN Pain Managment: General experience of comfort will improve 05/01/2017 1139 - Progressing by Darreld Mcleanox, Cameran Pettey, RN Safety: Ability to remain free from injury will improve 05/01/2017 1139 - Progressing by Darreld Mcleanox, Shernita Rabinovich, RN Skin Integrity: Risk for impaired skin integrity will decrease 05/01/2017 1139 - Progressing by Darreld Mcleanox, Ashland Osmer, RN Clinical Measurements: Ability to avoid or minimize complications of infection will improve 05/01/2017 1139 - Progressing by Darreld Mcleanox, Damaree Sargent, RN Skin Integrity: Skin integrity will improve 05/01/2017 1139 - Progressing by Darreld Mcleanox, Ambyr Qadri, RN   Adequate for Discharge Education: Knowledge of General Education information will  improve 05/01/2017 1139 - Adequate for Discharge by Darreld Mcleanox, Virna Livengood, RN 05/01/2017 1139 - Progressing by Darreld Mcleanox, Latesia Norrington, RN Health Behavior/Discharge Planning: Ability to manage health-related needs will improve 05/01/2017 1139 - Adequate for Discharge by Darreld Mcleanox, Latrelle Fuston, RN 05/01/2017 1139 - Progressing by Darreld Mcleanox, Ahlayah Tarkowski, RN Clinical Measurements: Ability to maintain clinical measurements within normal limits will improve 05/01/2017 1139 - Adequate for Discharge by Darreld Mcleanox, Kylar Leonhardt, RN 05/01/2017 1139 - Progressing by Darreld Mcleanox, Yalanda Soderman, RN Will remain free from infection 05/01/2017 1139 - Adequate for Discharge by Darreld Mcleanox, Tierria Watson, RN 05/01/2017 1139 - Progressing by Darreld Mcleanox, Rockland Kotarski, RN Diagnostic test results will improve 05/01/2017 1139 - Adequate for Discharge by Darreld Mcleanox, Odes Lolli, RN 05/01/2017 1139 - Progressing by Darreld Mcleanox, Christphor Groft, RN Respiratory complications will improve 05/01/2017 1139 - Adequate for Discharge by Darreld Mcleanox, Jolynda Townley, RN 05/01/2017 1139 - Progressing by Darreld Mcleanox, Navneet Schmuck, RN Cardiovascular complication will be avoided 05/01/2017 1139 - Adequate for Discharge by Darreld Mcleanox, Conchetta Lamia, RN 05/01/2017 1139 - Progressing by Darreld Mcleanox, Jarad Barth, RN Activity: Risk for activity intolerance will decrease 05/01/2017 1139 - Adequate for Discharge by Darreld Mcleanox, Bari Handshoe, RN 05/01/2017 1139 - Progressing by Darreld Mcleanox, Marchia Diguglielmo, RN Nutrition: Adequate nutrition will be maintained 05/01/2017 1139 - Adequate for Discharge by Darreld Mcleanox, Hae Ahlers, RN 05/01/2017 1139 - Progressing by Darreld Mcleanox, Bayler Gehrig, RN Coping: Level of anxiety will decrease 05/01/2017 1139 - Adequate for Discharge by Darreld Mcleanox, Armond Cuthrell, RN 05/01/2017 1139 - Progressing by Darreld Mcleanox, Skyelynn Rambeau, RN Elimination: Will not experience complications related to bowel motility 05/01/2017 1139 - Adequate for Discharge by Darreld Mcleanox, Noha Karasik, RN 05/01/2017 1139 - Progressing by Darreld Mcleanox, Terrilee Dudzik, RN Will not experience complications related to urinary retention 05/01/2017 1139 - Adequate for Discharge by Darreld Mcleanox, Demont Linford, RN 05/01/2017 1139 - Progressing by Darreld Mcleanox, Brihanna Devenport, RN Pain  Managment: General experience of comfort will improve 05/01/2017 1139 - Adequate for Discharge by Darreld Mcleanox, Lacinda Curvin, RN 05/01/2017 1139 - Progressing by Darreld Mcleanox, Boleslaus Holloway, RN  Safety: Ability to remain free from injury will improve 05/01/2017 1139 - Adequate for Discharge by Darreld Mcleanox, Jazariah Teall, RN 05/01/2017 1139 - Progressing by Darreld Mcleanox, Citlalli Weikel, RN Skin Integrity: Risk for impaired skin integrity will decrease 05/01/2017 1139 - Adequate for Discharge by Darreld Mcleanox, Kera Deacon, RN 05/01/2017 1139 - Progressing by Darreld Mcleanox, Sophee Mckimmy, RN Clinical Measurements: Ability to avoid or minimize complications of infection will improve 05/01/2017 1139 - Adequate for Discharge by Darreld Mcleanox, Shamya Macfadden, RN 05/01/2017 1139 - Progressing by Darreld Mcleanox, Elizjah Noblet, RN Skin Integrity: Skin integrity will improve 05/01/2017 1139 - Adequate for Discharge by Darreld Mcleanox, Adryanna Friedt, RN 05/01/2017 1139 - Progressing by Darreld Mcleanox, Casidee Jann, RN

## 2017-05-01 NOTE — Care Management Note (Signed)
Case Management Note  Patient Details  Name: Hunter [name] MRN: [id number] Date of Birth: [birth year]  Subjective/Objective:      Pt presented for abscess of forearm and being d/c'd today.  MD requested appointment with new PCP today.  [facility]        Action/Plan: Discussed [facility] with pt and advised about benefit of using pharmacy, etc.  As [facility] is closed today, pt advised to go to open clinic hours on Thursday morning at 8am and wait until seen.  Information about [facility] placed on AVS so that pt can try to call and make appointment.   Expected Discharge Date:  05/01/17               Expected Discharge Plan:  Home/Self Care  In-House Referral:     Discharge planning Services  CM Consult  Post Acute Care Choice:  NA Choice offered to:  NA  DME Arranged:    DME Agency:     HH Arranged:    HH Agency:     Status of Service:  Completed, signed off  If discussed at [employer] Length of Stay Meetings, dates discussed:    Additional Comments:  Verdene LennertGoldean, [name], RN 05/01/2017, 12:21 PM

## 2017-05-01 NOTE — Clinical Social Work Note (Signed)
Clinical Social Work Assessment  Patient Details  Name: Hunter [name] MRN: [id number] Date of Birth: March 17, 1986  Date of referral:  05/01/17               Reason for consult:  Substance Use/ETOH Abuse(Heroin)                Permission sought to share information with:    Permission granted to share information::     Name::        Agency::     Relationship::     Contact Information:     Housing/Transportation Living arrangements for the past 2 months:  Apartment(living with friend. ) Source of Information:  Patient Patient Interpreter Needed:  None Criminal Activity/Legal Involvement Pertinent to Current Situation/Hospitalization:  No - Comment as needed Significant Relationships:  Parents Lives with:  Other (Comment)(friend but wanting to go live with mom again. ) Do you feel safe going back to the place where you live?  Yes Need for family participation in patient care:  Yes (Comment)  Care giving concerns:  CS spoke with pt at bedside. This time no concerns have been presented to CSW at this time.    Social Worker assessment / plan:  CSW spoke with pt at bedside. During this time pt expressed that pt has been living with a friend but is planning to go back to mom's house at the time of discharge. Pt expresses having support from mom but no one else at this time. Pt was agreeable to outpatient and residential treatment options as pt expressed that pt has participated in treatment once before.   Employment status:  Unemployed Health and safety inspectornsurance information:  Self Pay (Medicaid Pending) PT Recommendations:  Not assessed at this time Information / Referral to community resources:  Residential Substance Abuse Treatment Options, Outpatient Substance Abuse Treatment Options  Patient/Family's Response to care:  Pt appears to be understanding and agreeable to plan of care at this time.   Patient/Family's Understanding of and Emotional Response to Diagnosis, Current Treatment, and Prognosis:   No further questions or concerns have been presented to CSW at this time.   Emotional Assessment Appearance:  Appears stated age Attitude/Demeanor/Rapport:    Affect (typically observed):  Pleasant Orientation:  Oriented to Self, Oriented to Place, Oriented to  Time, Oriented to Situation Alcohol / Substance use:  Illicit Drugs(heroin ) Psych involvement (Current and /or in the community):  No (Comment)(not that i am aware of. )  Discharge Needs  Concerns to be addressed:  Substance Abuse Concerns Readmission within the last 30 days:  No Current discharge risk:  Substance Abuse Barriers to Discharge:  No Barriers Identified   Robb MatarKierra S Romonda Parker, LCSWA 05/01/2017, 11:15 AM

## 2017-05-01 NOTE — Progress Notes (Signed)
CSW has spoken with pt and provided the requested resources to pt at this time. CSW reviewed treatment options for pt but encouraged pt to reach out to treatment facilities at pt' sown discretion. At this time there are no further CSW interventions needed. CSW signing off.  Please re consult as needed.     Claude MangesKierra S. Jalan Bodi, MSW, LCSW-A Emergency Department Clinical Social Worker [phone]

## 2017-05-01 NOTE — Discharge Summary (Addendum)
Physician Discharge Summary  Dalton Molesworth [facility] ZOX:[id number] DOB: Oct 29, 1985 DOA: 04/26/2017  PCP: Patient, No Pcp Per CM to arrange  Admit date: 04/26/2017 Discharge date: 05/01/2017  Recommendations for Outpatient Follow-up:  1. Resolution of right forearm deep space abscess, right wrist extensor tenosynovitis. F/u with Dr. Melvyn Novas 2 weeks (by about 12/4) 2. IVDU/heroin. Recommend cessation. 3. Hepatitis C treament if meets criteria   Follow-up Information    Bradly Bienenstock, MD. Schedule an appointment as soon as possible for a visit in 2 week(s).   Specialty:  Orthopedic Surgery Why:  office will contact you for appointment Contact information: [address] [place] [address] [phone]            Discharge Diagnoses:  1. Right forearm deep space abscess, right wrist extensor tenosynovitis  2. Strep pyogenes bacteremia 3. IVDU/heroin 4. Normocytic anemia 5. Hyponatremia secondary to dehydration 6. Hepatitis C  Discharge Condition: improved Disposition: home  Diet recommendation: regular  Filed Weights   04/26/17 1601 04/30/17 0906  Weight: 77.1 kg (170 lb) 77.1 kg (170 lb)    History of present illness:  31yom PMH ongoing IVDU with heroin presented with right forearm and hand pain and swelling.  Hospital Course:  Patient was seen by hand surgeon and underwent surgery. Post-op course uneventful and f/u per orthopedics recommended d/c home and f/u in office in 2 weeks. Individual issues as below.  Right forearm deep space abscess, right wrist extensor tenosynovitis s/p heroin IV drug use. S/p surgery 11/19. Patient did not meet criteria for sepsis on admission. No further surgery planned. - afebrile 96 hours, wound culture 11/22, S pyogenes and MSSA. - change to Augment on discharge, treat 4 weeks per d/w Dr. Ninetta Lights ID - f/u with ortho 2 weeks (about 12/4). Office closed. Message sent to Ms. Laurence Compton, cc Dr. Melvyn Novas to coordinate  follow-up.  Strep pyogenes bacteremia secondary to right forearm infection - TEE negative for vegetation - initial culture data final. Treatment as above.  IVDU/heroin. HIV non-reactive - monitored for withdrawal (none apparent) - I discussed with patient need to completely abstain; further heroin use can result in death  Normocytic anemia - stable  Hyponatremia secondary to dehydration - resolved  Hepatitis C - consider tx if IVDU stops. Discussed diagnosis with patient and implications, recommended follow-up for consideration of treatment in future.  Consultants:  Hand surgery   Procedures:   OPERATIVE PROCEDURE: #1: Right forearm drainage deep space abscess #2: Right hand drainage deep space abscess to include the hand and wrist #3: Right wrist extensor tenosynovectomy fourth dorsal compartment #4 right wrist second dorsal compartment extensor tenosynovectomy #5: Right thumb EPL extensor tenosynovectomy   Echo Study Conclusions  - Left ventricle: The cavity size was normal. Systolic function was normal. Wall motion was normal; there were no regional wall motion abnormalities.  Impressions:  - There was no evidence of a vegetation.  Recommendations: Consider transesophageal echocardiography if clinically indicated in order to exclude endocarditis.   Procedure:  Transesophageal echocardiogram Operator:  Armanda Magic, MD Indications:  Bacteremia Complications: None  During this procedure the patient is administered a total of Propofol 300 mg and Precedex 65 mg to achieve and maintain moderate conscious sedation.  The patient's heart rate, blood pressure, and oxygen saturation are monitored continuously during the procedure by anesthesia.   Results: Normal LV size and function Normal RV size and function Normal RA Normal LA Normal TV with mild TR Normal PV with trivial PR Normal MV with mild  MR Normal trileaflet AV Normal interatrial  septum with no evidence of shunt by colorflow dopper  Normal thoracic and ascending aorta. No evidence of vegetation    Antimicrobials:  Clindamycin 11/20 >> 11/23  PCN G 11/20 >> 11/23  Cefazolin 11/23 >> 11/24  Augmentin 11/24 >> 12/21  Today's assessment: S: feels better, arm sore but less swollen. Best day so far. O: Vitals:  Vitals:   04/30/17 1952 05/01/17 0519  BP: 118/71 115/75  Pulse: 67 66  Resp: 16 16  Temp: 98.2 F (36.8 C) 98.7 F (37.1 C)  SpO2: 100% 100%    Constitutional:  . Appears calm and comfortable Respiratory:  . CTA bilaterally, no w/r/r.  . Respiratory effort normal.  Cardiovascular:  . RRR, no m/r/g . No LE extremity edema   . Normal pedal pulses Musculoskeletal:  . Digits right hand: no cyanosis, petechiae, infection noted. No  Erythema. Edema decreasing. Psychiatric:  . Mental status o Mood, affect appropriate    Discharge Instructions  Discharge Instructions    Diet general   Complete by:  As directed    Discharge instructions   Complete by:  As directed    Call your physician or seek immediate medical attention for fever, increased pain, redness of hand, swelling, drainage, numbness or worsening of condition. Do not get right arm wet. Leave splint in place until seen by hand surgeon Dr. Melvyn Novasrtmann.   Increase activity slowly   Complete by:  As directed      Allergies as of 05/01/2017   No Known Allergies     Medication List    TAKE these medications   acetaminophen 325 MG tablet Commonly known as:  TYLENOL Take 2 tablets (650 mg total) by mouth every 6 (six) hours as needed for mild pain (or Fever >/= 101).   amoxicillin-clavulanate 875-125 MG tablet Commonly known as:  AUGMENTIN Take 1 tablet by mouth 2 (two) times daily for 28 days.      No Known Allergies  The results of significant diagnostics from this hospitalization (including imaging, microbiology, ancillary and laboratory) are listed below for  reference.    Significant Diagnostic Studies: Dg Chest Port 1 View  Result Date: 04/26/2017 CLINICAL DATA:  Fever.  Right hand swelling. EXAM: PORTABLE CHEST 1 VIEW COMPARISON:  None. FINDINGS: The cardiomediastinal silhouette is within normal limits. The bronchovascular markings are minimally prominent bilaterally without evidence of lobar consolidation, overt edema, sizable pleural effusion, or pneumothorax. No acute osseous abnormality is seen. IMPRESSION: No evidence of pneumonia. Electronically Signed   By: Sebastian AcheAllen  Grady M.D.   On: 04/26/2017 17:38   Dg Hand Complete Right  Result Date: 04/26/2017 CLINICAL DATA:  Right hand swelling EXAM: RIGHT HAND - COMPLETE 3+ VIEW COMPARISON:  None. FINDINGS: There is severe soft tissue swelling of the right hand. The bones are normal. No soft tissue emphysema. IMPRESSION: Severe right hand soft tissue swelling without osseous abnormality. Electronically Signed   By: Deatra RobinsonKevin  Herman M.D.   On: 04/26/2017 17:37    Microbiology: Recent Results (from the past 240 hour(s))  Blood culture (routine x 2)     Status: Abnormal   Collection Time: 04/26/17  4:15 PM  Result Value Ref Range Status   Specimen Description BLOOD BLOOD LEFT ARM  Final   Special Requests   Final    BOTTLES DRAWN AEROBIC AND ANAEROBIC Blood Culture results may not be optimal due to an excessive volume of blood received in culture bottles   Culture  Setup  Time   Final    GRAM POSITIVE COCCI IN CHAINS IN BOTH AEROBIC AND ANAEROBIC BOTTLES CRITICAL RESULT CALLED TO, READ BACK BY AND VERIFIED WITH: MTURNER,PHARMD @0851  04/27/17 BY LHOWARD    Culture (A)  Final    GROUP A STREP (S.PYOGENES) ISOLATED HEALTH DEPARTMENT NOTIFIED Performed at [facility], [address] [place], [place] [address]    Report Status 04/29/2017 FINAL  Final   Organism ID, Bacteria GROUP A STREP (S.PYOGENES) ISOLATED  Final      Susceptibility   Group a strep (s.pyogenes) isolated - MIC*     PENICILLIN <=0.06 SENSITIVE Sensitive     CEFTRIAXONE <=0.12 SENSITIVE Sensitive     ERYTHROMYCIN <=0.12 SENSITIVE Sensitive     LEVOFLOXACIN 0.5 SENSITIVE Sensitive     VANCOMYCIN 0.5 SENSITIVE Sensitive     * GROUP A STREP (S.PYOGENES) ISOLATED  Blood Culture ID Panel (Reflexed)     Status: Abnormal   Collection Time: 04/26/17  4:15 PM  Result Value Ref Range Status   Enterococcus species NOT DETECTED NOT DETECTED Final   Listeria monocytogenes NOT DETECTED NOT DETECTED Final   Staphylococcus species NOT DETECTED NOT DETECTED Final   Staphylococcus aureus NOT DETECTED NOT DETECTED Final   Streptococcus species DETECTED (A) NOT DETECTED Final    Comment: CRITICAL RESULT CALLED TO, READ BACK BY AND VERIFIED WITH: MTURNER,PHARMD @0951  04/27/17 BY LHOWARD    Streptococcus agalactiae NOT DETECTED NOT DETECTED Final   Streptococcus pneumoniae NOT DETECTED NOT DETECTED Final   Streptococcus pyogenes DETECTED (A) NOT DETECTED Final    Comment: CRITICAL RESULT CALLED TO, READ BACK BY AND VERIFIED WITH: MTURNER,PHARMD BY LHOWARD @0951  04/27/17    Acinetobacter baumannii NOT DETECTED NOT DETECTED Final   Enterobacteriaceae species NOT DETECTED NOT DETECTED Final   Enterobacter cloacae complex NOT DETECTED NOT DETECTED Final   Escherichia coli NOT DETECTED NOT DETECTED Final   Klebsiella oxytoca NOT DETECTED NOT DETECTED Final   Klebsiella pneumoniae NOT DETECTED NOT DETECTED Final   Proteus species NOT DETECTED NOT DETECTED Final   Serratia marcescens NOT DETECTED NOT DETECTED Final   Haemophilus influenzae NOT DETECTED NOT DETECTED Final   Neisseria meningitidis NOT DETECTED NOT DETECTED Final   Pseudomonas aeruginosa NOT DETECTED NOT DETECTED Final   Candida albicans NOT DETECTED NOT DETECTED Final   Candida glabrata NOT DETECTED NOT DETECTED Final   Candida krusei NOT DETECTED NOT DETECTED Final   Candida parapsilosis NOT DETECTED NOT DETECTED Final   Candida tropicalis NOT DETECTED  NOT DETECTED Final    Comment: Performed at [facility], [address] [place], [place] [address]  Blood culture (routine x 2)     Status: Abnormal   Collection Time: 04/26/17  4:20 PM  Result Value Ref Range Status   Specimen Description BLOOD BLOOD LEFT HAND  Final   Special Requests   Final    BOTTLES DRAWN AEROBIC AND ANAEROBIC Blood Culture adequate volume   Culture  Setup Time   Final    GRAM POSITIVE COCCI IN CHAINS IN BOTH AEROBIC AND ANAEROBIC BOTTLES CRITICAL VALUE NOTED.  VALUE IS CONSISTENT WITH PREVIOUSLY REPORTED AND CALLED VALUE.    Culture (A)  Final    GROUP A STREP (S.PYOGENES) ISOLATED SUSCEPTIBILITIES PERFORMED ON PREVIOUS CULTURE WITHIN THE LAST 5 DAYS. HEALTH DEPARTMENT NOTIFIED Performed at [facility], [address] [place], [place] [address]    Report Status 04/29/2017 FINAL  Final  Wound or Superficial Culture  Status: None   Collection Time: 04/26/17  4:30 PM  Result Value Ref Range Status   Specimen Description WOUND RIGHT HAND  Final   Special Requests Normal  Final   Gram Stain   Final    NO WBC SEEN FEW GRAM POSITIVE COCCI Performed at [facility], [address] [place], [place] [address]    Culture   Final    MODERATE GROUP A STREP (S.PYOGENES) ISOLATED MODERATE STAPHYLOCOCCUS AUREUS    Report Status 04/29/2017 FINAL  Final   Organism ID, Bacteria STAPHYLOCOCCUS AUREUS  Final      Susceptibility   Staphylococcus aureus - MIC*    CIPROFLOXACIN <=0.5 SENSITIVE Sensitive     ERYTHROMYCIN <=0.25 SENSITIVE Sensitive     GENTAMICIN <=0.5 SENSITIVE Sensitive     OXACILLIN 0.5 SENSITIVE Sensitive     TETRACYCLINE <=1 SENSITIVE Sensitive     VANCOMYCIN 1 SENSITIVE Sensitive     TRIMETH/SULFA <=10 SENSITIVE Sensitive     CLINDAMYCIN <=0.25 SENSITIVE Sensitive     RIFAMPIN <=0.5 SENSITIVE Sensitive     Inducible Clindamycin NEGATIVE Sensitive     * MODERATE STAPHYLOCOCCUS AUREUS  Surgical pcr screen     Status:  Abnormal   Collection Time: 04/26/17  9:52 PM  Result Value Ref Range Status   MRSA, PCR NEGATIVE NEGATIVE Final   Staphylococcus aureus POSITIVE (A) NEGATIVE Final    Comment: (NOTE) The Xpert SA Assay (FDA approved for NASAL specimens in patients 28 years of age and older), is one component of a comprehensive surveillance program. It is not intended to diagnose infection nor to guide or monitor treatment.   Aerobic/Anaerobic Culture (surgical/deep wound)     Status: None (Preliminary result)   Collection Time: 04/26/17 11:17 PM  Result Value Ref Range Status   Specimen Description ABSCESS RIGHT HAND  Final   Special Requests PATIENT ON FOLLOWING ZOSYN AND VANC  Final   Gram Stain   Final    ABUNDANT WBC PRESENT, PREDOMINANTLY PMN RARE GRAM POSITIVE COCCI    Culture   Final    RARE GROUP A STREP (S.PYOGENES) ISOLATED NO ANAEROBES ISOLATED; CULTURE IN PROGRESS FOR 5 DAYS    Report Status PENDING  Incomplete  Culture, blood (Routine X 2) w Reflex to ID Panel     Status: None (Preliminary result)   Collection Time: 04/29/17  2:29 PM  Result Value Ref Range Status   Specimen Description BLOOD RIGHT ANTECUBITAL  Final   Special Requests IN PEDIATRIC BOTTLE Blood Culture adequate volume  Final   Culture NO GROWTH < 24 HOURS  Final   Report Status PENDING  Incomplete  Culture, blood (Routine X 2) w Reflex to ID Panel     Status: None (Preliminary result)   Collection Time: 04/29/17  2:40 PM  Result Value Ref Range Status   Specimen Description BLOOD LEFT FOREARM  Final   Special Requests IN PEDIATRIC BOTTLE Blood Culture adequate volume  Final   Culture NO GROWTH < 24 HOURS  Final   Report Status PENDING  Incomplete     Labs: Basic Metabolic Panel: Recent Labs  Lab 04/26/17 1614 04/27/17 0536 04/29/17 1955 04/30/17 0713  NA 128* 133* 138 136  K 3.6 3.5 4.1 4.4  CL 94* 104 104 101  CO2 25 23 26 27   GLUCOSE 108* 121* 107* 99  BUN 14 7 8 7   CREATININE 0.87 0.69 0.74 0.68   CALCIUM 8.8* 7.9* 8.8* 9.0   Liver Function Tests: Recent  Labs  Lab 04/26/17 1614  AST 27  ALT 25  ALKPHOS 100  BILITOT 0.8  PROT 8.3*  ALBUMIN 3.6   CBC: Recent Labs  Lab 04/26/17 1614 04/27/17 0536 04/30/17 0713  WBC 15.5* 13.3* 7.1  NEUTROABS 11.6*  --   --   HGB 11.6* 9.8* 11.1*  HCT 34.7* 29.5* 34.2*  MCV 85.5 86.3 87.5  PLT 365 296 399    Principal Problem:   Abscess of forearm Active Problems:   Cellulitis   Hyponatremia   IVDU (intravenous drug user)   Streptococcal bacteremia   Normocytic anemia   Chronic hepatitis C without hepatic coma (HCC)   Time coordinating discharge: 35 minutes  Signed:  Brendia Sacksaniel Ramelo Oetken, MD [facility] 05/01/2017, 11:34 AM

## 2017-05-02 LAB — AEROBIC/ANAEROBIC CULTURE W GRAM STAIN (SURGICAL/DEEP WOUND)

## 2017-05-02 LAB — AEROBIC/ANAEROBIC CULTURE (SURGICAL/DEEP WOUND)

## 2017-05-04 LAB — CULTURE, BLOOD (ROUTINE X 2)
CULTURE: NO GROWTH
CULTURE: NO GROWTH
Special Requests: ADEQUATE
Special Requests: ADEQUATE

## 2017-05-21 DIAGNOSIS — M4626 Osteomyelitis of vertebra, lumbar region: Secondary | ICD-10-CM | POA: Diagnosis present

## 2017-05-21 DIAGNOSIS — A4101 Sepsis due to Methicillin susceptible Staphylococcus aureus: Principal | ICD-10-CM | POA: Diagnosis present

## 2017-05-21 DIAGNOSIS — Z8249 Family history of ischemic heart disease and other diseases of the circulatory system: Secondary | ICD-10-CM

## 2017-05-21 DIAGNOSIS — I472 Ventricular tachycardia: Secondary | ICD-10-CM | POA: Diagnosis not present

## 2017-05-21 DIAGNOSIS — F17210 Nicotine dependence, cigarettes, uncomplicated: Secondary | ICD-10-CM | POA: Diagnosis present

## 2017-05-21 DIAGNOSIS — G8929 Other chronic pain: Secondary | ICD-10-CM | POA: Diagnosis present

## 2017-05-21 DIAGNOSIS — Z9114 Patient's other noncompliance with medication regimen: Secondary | ICD-10-CM

## 2017-05-21 DIAGNOSIS — K6812 Psoas muscle abscess: Secondary | ICD-10-CM | POA: Diagnosis present

## 2017-05-21 DIAGNOSIS — L02213 Cutaneous abscess of chest wall: Secondary | ICD-10-CM | POA: Diagnosis present

## 2017-05-21 DIAGNOSIS — F1910 Other psychoactive substance abuse, uncomplicated: Secondary | ICD-10-CM | POA: Diagnosis present

## 2017-05-21 DIAGNOSIS — M609 Myositis, unspecified: Secondary | ICD-10-CM | POA: Diagnosis present

## 2017-05-21 DIAGNOSIS — J189 Pneumonia, unspecified organism: Secondary | ICD-10-CM | POA: Diagnosis present

## 2017-05-21 DIAGNOSIS — B182 Chronic viral hepatitis C: Secondary | ICD-10-CM | POA: Diagnosis present

## 2017-05-21 DIAGNOSIS — K5900 Constipation, unspecified: Secondary | ICD-10-CM | POA: Diagnosis not present

## 2017-05-21 DIAGNOSIS — L03113 Cellulitis of right upper limb: Secondary | ICD-10-CM | POA: Diagnosis present

## 2017-05-21 DIAGNOSIS — M4646 Discitis, unspecified, lumbar region: Secondary | ICD-10-CM | POA: Diagnosis present

## 2017-05-21 DIAGNOSIS — R0781 Pleurodynia: Secondary | ICD-10-CM | POA: Diagnosis present

## 2017-05-21 DIAGNOSIS — J9811 Atelectasis: Secondary | ICD-10-CM | POA: Diagnosis present

## 2017-05-21 DIAGNOSIS — E876 Hypokalemia: Secondary | ICD-10-CM | POA: Diagnosis present

## 2017-05-21 DIAGNOSIS — D649 Anemia, unspecified: Secondary | ICD-10-CM | POA: Diagnosis present

## 2017-05-22 ENCOUNTER — Emergency Department (HOSPITAL_COMMUNITY): Payer: Self-pay

## 2017-05-22 ENCOUNTER — Inpatient Hospital Stay (HOSPITAL_COMMUNITY)
Admission: RE | Admit: 2017-05-22 | Discharge: 2017-07-03 | DRG: 871 | Disposition: A | Discharge status: Home / Self Care | Payer: Self-pay | Attending: Internal Medicine | Admitting: Internal Medicine

## 2017-05-22 ENCOUNTER — Encounter (HOSPITAL_COMMUNITY): Payer: Self-pay | Admitting: Emergency Medicine

## 2017-05-22 ENCOUNTER — Other Ambulatory Visit: Payer: Self-pay

## 2017-05-22 DIAGNOSIS — R7881 Bacteremia: Secondary | ICD-10-CM | POA: Diagnosis present

## 2017-05-22 DIAGNOSIS — R059 Cough, unspecified: Secondary | ICD-10-CM

## 2017-05-22 DIAGNOSIS — M869 Osteomyelitis, unspecified: Secondary | ICD-10-CM | POA: Insufficient documentation

## 2017-05-22 DIAGNOSIS — K6812 Psoas muscle abscess: Secondary | ICD-10-CM

## 2017-05-22 DIAGNOSIS — M4646 Discitis, unspecified, lumbar region: Secondary | ICD-10-CM | POA: Diagnosis present

## 2017-05-22 DIAGNOSIS — B182 Chronic viral hepatitis C: Secondary | ICD-10-CM | POA: Diagnosis present

## 2017-05-22 DIAGNOSIS — A419 Sepsis, unspecified organism: Secondary | ICD-10-CM | POA: Insufficient documentation

## 2017-05-22 DIAGNOSIS — J9811 Atelectasis: Secondary | ICD-10-CM

## 2017-05-22 DIAGNOSIS — F1990 Other psychoactive substance use, unspecified, uncomplicated: Secondary | ICD-10-CM | POA: Diagnosis present

## 2017-05-22 DIAGNOSIS — B9561 Methicillin susceptible Staphylococcus aureus infection as the cause of diseases classified elsewhere: Secondary | ICD-10-CM | POA: Diagnosis present

## 2017-05-22 DIAGNOSIS — E876 Hypokalemia: Secondary | ICD-10-CM | POA: Diagnosis present

## 2017-05-22 DIAGNOSIS — D649 Anemia, unspecified: Secondary | ICD-10-CM | POA: Diagnosis present

## 2017-05-22 DIAGNOSIS — D72829 Elevated white blood cell count, unspecified: Secondary | ICD-10-CM | POA: Insufficient documentation

## 2017-05-22 DIAGNOSIS — M8618 Other acute osteomyelitis, other site: Secondary | ICD-10-CM

## 2017-05-22 DIAGNOSIS — L02213 Cutaneous abscess of chest wall: Secondary | ICD-10-CM

## 2017-05-22 DIAGNOSIS — R05 Cough: Secondary | ICD-10-CM

## 2017-05-22 HISTORY — DX: Tourette's disorder: F95.2

## 2017-05-22 LAB — COMPREHENSIVE METABOLIC PANEL
ALBUMIN: 2.4 g/dL — AB (ref 3.5–5.0)
ALT: 20 U/L (ref 17–63)
AST: 29 U/L (ref 15–41)
Alkaline Phosphatase: 84 U/L (ref 38–126)
Anion gap: 7 (ref 5–15)
BUN: 5 mg/dL — AB (ref 6–20)
CHLORIDE: 101 mmol/L (ref 101–111)
CO2: 27 mmol/L (ref 22–32)
Calcium: 8.4 mg/dL — ABNORMAL LOW (ref 8.9–10.3)
Creatinine, Ser: 0.7 mg/dL (ref 0.61–1.24)
GFR calc Af Amer: >60 mL/min (ref >60)
GFR calc non Af Amer: >60 mL/min (ref >60)
GLUCOSE: 141 mg/dL — AB (ref 65–99)
POTASSIUM: 3.2 mmol/L — AB (ref 3.5–5.1)
Sodium: 135 mmol/L (ref 135–145)
Total Bilirubin: 0.4 mg/dL (ref 0.3–1.2)
Total Protein: 7.1 g/dL (ref 6.5–8.1)

## 2017-05-22 LAB — HEPATIC FUNCTION PANEL
ALT: 20 U/L (ref 17–63)
AST: 31 U/L (ref 15–41)
Albumin: 2.3 g/dL — ABNORMAL LOW (ref 3.5–5.0)
Alkaline Phosphatase: 83 U/L (ref 38–126)
BILIRUBIN TOTAL: 0.4 mg/dL (ref 0.3–1.2)
Total Protein: 6.9 g/dL (ref 6.5–8.1)

## 2017-05-22 LAB — CBC WITH DIFFERENTIAL/PLATELET
BASOS PCT: 0 %
Basophils Absolute: 0 10*3/uL (ref 0.0–0.1)
Eosinophils Absolute: 0.3 10*3/uL (ref 0.0–0.7)
Eosinophils Relative: 3 %
HEMATOCRIT: 27.5 % — AB (ref 39.0–52.0)
HEMOGLOBIN: 8.9 g/dL — AB (ref 13.0–17.0)
LYMPHS ABS: 1.4 10*3/uL (ref 0.7–4.0)
Lymphocytes Relative: 14 %
MCH: 27.6 pg (ref 26.0–34.0)
MCHC: 32.4 g/dL (ref 30.0–36.0)
MCV: 85.4 fL (ref 78.0–100.0)
MONOS PCT: 11 %
Monocytes Absolute: 1.1 10*3/uL — ABNORMAL HIGH (ref 0.1–1.0)
NEUTROS ABS: 7.1 10*3/uL (ref 1.7–7.7)
NEUTROS PCT: 72 %
Platelets: 391 10*3/uL (ref 150–400)
RBC: 3.22 MIL/uL — ABNORMAL LOW (ref 4.22–5.81)
RDW: 13.6 % (ref 11.5–15.5)
WBC: 9.9 10*3/uL (ref 4.0–10.5)

## 2017-05-22 LAB — CBC
HCT: 28.4 % — ABNORMAL LOW (ref 39.0–52.0)
Hemoglobin: 9.2 g/dL — ABNORMAL LOW (ref 13.0–17.0)
MCH: 27.8 pg (ref 26.0–34.0)
MCHC: 32.4 g/dL (ref 30.0–36.0)
MCV: 85.8 fL (ref 78.0–100.0)
PLATELETS: 380 10*3/uL (ref 150–400)
RBC: 3.31 MIL/uL — AB (ref 4.22–5.81)
RDW: 13.9 % (ref 11.5–15.5)
WBC: 13.1 10*3/uL — ABNORMAL HIGH (ref 4.0–10.5)

## 2017-05-22 LAB — LACTIC ACID, PLASMA: Lactic Acid, Venous: 0.9 mmol/L (ref 0.5–1.9)

## 2017-05-22 LAB — RAPID URINE DRUG SCREEN, HOSP PERFORMED
Amphetamines: POSITIVE — AB
BENZODIAZEPINES: NOT DETECTED
Barbiturates: NOT DETECTED
COCAINE: NOT DETECTED
OPIATES: POSITIVE — AB
Tetrahydrocannabinol: NOT DETECTED

## 2017-05-22 LAB — PROCALCITONIN: Procalcitonin: 1.01 ng/mL

## 2017-05-22 LAB — BASIC METABOLIC PANEL
Anion gap: 8 (ref 5–15)
BUN: 12 mg/dL (ref 6–20)
CHLORIDE: 99 mmol/L — AB (ref 101–111)
CO2: 25 mmol/L (ref 22–32)
CREATININE: 0.67 mg/dL (ref 0.61–1.24)
Calcium: 8.2 mg/dL — ABNORMAL LOW (ref 8.9–10.3)
GFR calc Af Amer: >60 mL/min (ref >60)
GLUCOSE: 126 mg/dL — AB (ref 65–99)
POTASSIUM: 3.4 mmol/L — AB (ref 3.5–5.1)
SODIUM: 132 mmol/L — AB (ref 135–145)

## 2017-05-22 LAB — I-STAT TROPONIN, ED: Troponin i, poc: 0 ng/mL (ref 0.00–0.08)

## 2017-05-22 LAB — LIPASE, BLOOD: Lipase: 25 U/L (ref 11–51)

## 2017-05-22 LAB — PHOSPHORUS: Phosphorus: 2.6 mg/dL (ref 2.5–4.6)

## 2017-05-22 LAB — MAGNESIUM: Magnesium: 1.7 mg/dL (ref 1.7–2.4)

## 2017-05-22 LAB — I-STAT CG4 LACTIC ACID, ED: LACTIC ACID, VENOUS: 1.1 mmol/L (ref 0.5–1.9)

## 2017-05-22 LAB — ETHANOL: Alcohol, Ethyl (B): <10 mg/dL (ref <10)

## 2017-05-22 MED ORDER — AZITHROMYCIN 250 MG PO TABS: 250.0000 mg | ORAL_TABLET | Freq: Every day | ORAL | 0 refills | Status: DC

## 2017-05-22 MED ORDER — ENOXAPARIN SODIUM 30 MG/0.3ML ~~LOC~~ SOLN
30.0000 mg | SUBCUTANEOUS | Status: DC
  Administered 2017-05-22: 30 mg via SUBCUTANEOUS
  Filled 2017-05-22: qty 0.3

## 2017-05-22 MED ORDER — POTASSIUM CHLORIDE CRYS ER 20 MEQ PO TBCR
40.0000 meq | EXTENDED_RELEASE_TABLET | Freq: Once | ORAL | Status: AC
  Administered 2017-05-22: 40 meq via ORAL
  Filled 2017-05-22: qty 2

## 2017-05-22 MED ORDER — IOPAMIDOL (ISOVUE-370) INJECTION 76%
INTRAVENOUS | Status: AC
  Administered 2017-05-22: 100 mL
  Filled 2017-05-22: qty 100

## 2017-05-22 MED ORDER — HYDROMORPHONE HCL 1 MG/ML IJ SOLN
1.0000 mg | INTRAMUSCULAR | Status: DC | PRN
  Administered 2017-05-22 – 2017-05-30 (×60): 1 mg via INTRAVENOUS
  Filled 2017-05-22 (×60): qty 1

## 2017-05-22 MED ORDER — SODIUM CHLORIDE 0.9 % IV BOLUS (SEPSIS)
1000.0000 mL | Freq: Once | INTRAVENOUS | Status: AC
  Administered 2017-05-22: 1000 mL via INTRAVENOUS

## 2017-05-22 MED ORDER — SODIUM CHLORIDE 0.9% FLUSH
3.0000 mL | Freq: Two times a day (BID) | INTRAVENOUS | Status: DC
  Administered 2017-05-23 – 2017-07-03 (×37): 3 mL via INTRAVENOUS

## 2017-05-22 MED ORDER — SODIUM CHLORIDE 0.9 % IV SOLN
INTRAVENOUS | Status: DC
  Administered 2017-05-22 – 2017-05-26 (×4): via INTRAVENOUS

## 2017-05-22 MED ORDER — GADOBENATE DIMEGLUMINE 529 MG/ML IV SOLN
15.0000 mL | Freq: Once | INTRAVENOUS | Status: AC
  Administered 2017-05-22: 15 mL via INTRAVENOUS

## 2017-05-22 MED ORDER — VANCOMYCIN HCL 500 MG IV SOLR
500.0000 mg | Freq: Once | INTRAVENOUS | Status: AC
  Administered 2017-05-22: 500 mg via INTRAVENOUS
  Filled 2017-05-22 (×2): qty 500

## 2017-05-22 MED ORDER — LORAZEPAM 2 MG/ML IJ SOLN: 1.0000 mg | Freq: Once | INTRAMUSCULAR | Status: DC

## 2017-05-22 MED ORDER — VANCOMYCIN HCL IN DEXTROSE 1-5 GM/200ML-% IV SOLN
1000.0000 mg | Freq: Once | INTRAVENOUS | Status: AC
  Administered 2017-05-22: 1000 mg via INTRAVENOUS
  Filled 2017-05-22: qty 200

## 2017-05-22 MED ORDER — ONDANSETRON HCL 4 MG/2ML IJ SOLN: 4.0000 mg | Freq: Four times a day (QID) | INTRAMUSCULAR | Status: DC | PRN

## 2017-05-22 MED ORDER — ACETAMINOPHEN 325 MG PO TABS
650.0000 mg | ORAL_TABLET | Freq: Four times a day (QID) | ORAL | Status: DC | PRN
  Administered 2017-05-26 – 2017-06-06 (×12): 650 mg via ORAL
  Filled 2017-05-22 (×12): qty 2

## 2017-05-22 MED ORDER — ONDANSETRON HCL 4 MG PO TABS: 4.0000 mg | ORAL_TABLET | Freq: Four times a day (QID) | ORAL | Status: DC | PRN

## 2017-05-22 MED ORDER — VANCOMYCIN HCL IN DEXTROSE 1-5 GM/200ML-% IV SOLN
1000.0000 mg | Freq: Three times a day (TID) | INTRAVENOUS | Status: DC
  Administered 2017-05-23 (×2): 1000 mg via INTRAVENOUS
  Filled 2017-05-22 (×3): qty 200

## 2017-05-22 MED ORDER — PIPERACILLIN-TAZOBACTAM 3.375 G IVPB
3.3750 g | Freq: Three times a day (TID) | INTRAVENOUS | Status: DC
  Administered 2017-05-22 – 2017-05-23 (×3): 3.375 g via INTRAVENOUS
  Filled 2017-05-22 (×4): qty 50

## 2017-05-22 MED ORDER — PIPERACILLIN-TAZOBACTAM 3.375 G IVPB 30 MIN
3.3750 g | Freq: Once | INTRAVENOUS | Status: AC
  Administered 2017-05-22: 3.375 g via INTRAVENOUS
  Filled 2017-05-22: qty 50

## 2017-05-22 NOTE — ED Notes (Signed)
Pt moaning and groaning after waking up from sleep

## 2017-05-22 NOTE — ED Provider Notes (Signed)
31 year old male received at sign out from [place] pending results of C/T/L MRIs.  Per her HPI:  "Hunter [name] is [name] 31 y.o. male  with [name] PMH of IV drug use, hepatitis C who presents to the Emergency Department complaining of shortness of breath x 3-4 days. Pain is worse with taking deep breaths and with touching the area. Patient states that his trouble breathing is due to "gas bubbles popping". He will point to his chest and ask if I see the gas bubbles, however noting visible on the skin. He does endorse doing meth either yesterday or today, he is unsure. Associated with generalized body aches and chest pain. Denies fever, chills, cough, congestion.  Patient was admitted to the hospital about [name] month ago (11/19) for right forearm deep space abscess and strep pyogenes bacteremia. He did undergo surgery for abscess on 11/19 and discharged on 11/24. He notes mostly staying at home and in bed since discharge. He feels as if right arm is improving."  Physical Exam  BP 121/70 (BP Location: Right Arm)   Pulse 93   Temp 98.5 F (36.9 C) (Oral)   Resp 20   Ht 6\' 1"  (1.854 m)   Wt 72.6 kg (160 lb)   SpO2 100%   BMI 21.11 kg/m   Physical Exam I did not exam this patient from sign out prior to admission. ED Course/Procedures     Procedures  MDM   31 year old male with [name] h/o of IVDU, hepatitis C, strep pyogenes bacteremia, and right forearm deep space abscess and right wrist extensor tenosynovitis who presents to the emergency department with [name] chief complaint of dyspnea and " diffuse all-over pain."  The patient was discussed with Dr. Juleen ChinaKohut, attending physician. He was admitted to the hospital on 1119 through 1124 for [name] right forearm deep space abscess and strep pyogenes bacteremia.  He underwent surgery for abscess on 1119 and was discharged home with Augmentin, which he has not been compliant with.  Chest x-ray questionable mild pneumonia in the left basilar airspace.  Due to the patient's  history of IV drug use and all of her pain, MRIs of the cervical, thoracic, lumbar spine were ordered, which demonstrated L2-L3 osteomyelitis/discitis and right greater than left paravertebral soft tissue inflammation with extensive right so as inflammation myositis as well as [name] 20 x 6 mm right psoas abscess. Blood Cx x2 pending. Consulted the hospitalist team and spoke with Dr. Elisabeth Pigeonevine who will admit the patient for further work-up and management. She requested [name] consult to neurosurgery for their assistance, which is pending.       Barkley BoardsMcDonald, Hunter Kundrat A, PA-C 05/22/17 1944    Raeford RazorKohut, Stephen, MD 05/25/17 1130

## 2017-05-22 NOTE — ED Notes (Signed)
Pt sleeping. 

## 2017-05-22 NOTE — ED Notes (Signed)
Vancomycin not received from pharmacy as of yet.

## 2017-05-22 NOTE — ED Triage Notes (Addendum)
Pt reports "Gas bubbles" popping up to entire body since discharge from hand surgery 04/30/17. States very painful. "bubbles" cannot be visualized on surface of skin, no abscesses noted. This RN looked at patient's back and chest where he described them, no lesions noted. When skin is palpated pt reports increased pain. Reports generalized aches and shortness of breath. Hx substance use, reports last use of heroin 4 days ago. Unsure if patient is hallucinating??

## 2017-05-22 NOTE — H&P (Signed)
History and Physical    Hunter [name] KNL:[id number] DOB: [birth year] DOA: 05/22/2017  Referring MD/NP/PA: Maree Erie  PCP: Patient, No Pcp Per    Patient coming from: home  Chief Complaint: shortness of breath and lower back pain  HPI: Hunter [name] is a 31 y.o. male with medical history significant for IVDA, recent hospitalization for right forearm deep abscess and strep pyogenes bacteremia, hepatitis C who presented to ED with reports of shortness of breath for past 3-4 days prior to the admission associated with chest pain especially when taking a deep breath. No reports of cough. No fevers and no chills. Patient was going to be discharged home but then started to complain of his lower back pain and inability to walk. No reports of falls. No nausea or vomiting. No sensation loss in arms or legs. No syncope.  ED Course: In ED, T max was 99.9 F, HR 108, RR 28, BP 117/76 and oxygen saturation 97%. Blood work showed WBC count 13.1, hgb 9.2, potassium 3.4 (supplemented). CT angio chest was done and showed no pulmonary embolism. MRI showed discitis/osteomyelitis of lumbar spine. Pt started on vanco and zosyn while awaiting culture results. Neurosurgery consulted by ED.  Review of Systems:  Constitutional: Negative for fever, chills, diaphoresis, activity change, appetite change and fatigue.  HENT: Negative for ear pain, nosebleeds, congestion, facial swelling, rhinorrhea, neck pain, neck stiffness and ear discharge.   Eyes: Negative for pain, discharge, redness, itching and visual disturbance.  Respiratory: Negative for cough, choking, chest tightness, shortness of breath, wheezing and stridor.   Cardiovascular: Negative for chest pain, palpitations and leg swelling.  Gastrointestinal: Negative for abdominal distention.  Genitourinary: Negative for dysuria, urgency, frequency, hematuria, flank pain, decreased urine volume, difficulty urinating and dyspareunia.  Musculoskeletal: Negative for  back pain, joint swelling, arthralgias and gait problem.  Neurological: Negative for dizziness, tremors, seizures, syncope, facial asymmetry, speech difficulty, weakness, light-headedness, numbness and headaches.  Hematological: Negative for adenopathy. Does not bruise/bleed easily.  Psychiatric/Behavioral: Negative for hallucinations, behavioral problems, confusion, dysphoric mood, decreased concentration and agitation.   Past Medical History:  Diagnosis Date  . Chronic hepatitis C without hepatic coma ([place]) 04/28/2017  . Hepatitis-C   . IVDU (intravenous drug user)     Past Surgical History:  Procedure Laterality Date  . I&D EXTREMITY Right 04/26/2017   Procedure: IRRIGATION AND DEBRIDEMENT RIGHT HAND AND FOREARM;  Surgeon: Iran Planas, MD;  Location: [facility];  Service: Orthopedics;  Laterality: Right;  . TEE WITHOUT CARDIOVERSION N/A 04/30/2017   Procedure: TRANSESOPHAGEAL ECHOCARDIOGRAM (TEE);  Surgeon: Sueanne Margarita, MD;  Location: [facility];  Service: Cardiovascular;  Laterality: N/A;  . TONSILLECTOMY      Social history:  reports that he has been smoking cigarettes.  he has never used smokeless tobacco. He reports that he uses drugs. Drug: IV. He reports that he does not drink alcohol.   Ambulation: ambulates without assistance at baseline   No Known Allergies  Family history: hypertension in mother   Prior to Admission medications   Medication Sig Start Date End Date Taking? Authorizing Provider  acetaminophen (TYLENOL) 325 MG tablet Take 2 tablets (650 mg total) by mouth every 6 (six) hours as needed for mild pain (or Fever >/= 101). Patient not taking: Reported on 05/22/2017 05/01/17   Samuella Cota, MD  amoxicillin-clavulanate (AUGMENTIN) 875-125 MG tablet Take 1 tablet by mouth 2 (two) times daily for 28 days. Patient not taking: Reported on 05/22/2017 05/01/17 05/29/17  Samuella Cota, MD  azithromycin (ZITHROMAX) 250 MG tablet Take 1 tablet (250 mg  total) by mouth daily. Take first 2 tablets together, then 1 every day until finished. 05/22/17   Ward, Ozella Almond, PA-C    Physical Exam: Vitals:   05/22/17 1015 05/22/17 1025 05/22/17 1030 05/22/17 1045  BP: 127/83  131/82 127/75  Pulse: 74  92 81  Resp: 20  13 (!) 21  Temp:  98.9 F (37.2 C)    TempSrc:  Oral    SpO2: 98%  100% 97%  Weight:      Height:        Constitutional: NAD, calm, comfortable Vitals:   05/22/17 1015 05/22/17 1025 05/22/17 1030 05/22/17 1045  BP: 127/83  131/82 127/75  Pulse: 74  92 81  Resp: 20  13 (!) 21  Temp:  98.9 F (37.2 C)    TempSrc:  Oral    SpO2: 98%  100% 97%  Weight:      Height:       Eyes: PERRL, lids and conjunctivae normal ENMT: Mucous membranes are moist. Posterior pharynx clear of any exudate or lesions.Normal dentition.  Neck: normal, supple, no masses, no thyromegaly Respiratory: clear to auscultation bilaterally, no wheezing, no crackles. Normal respiratory effort. No accessory muscle use.  Cardiovascular: Regular rate and rhythm, no murmurs / rubs / gallops. No extremity edema. 2+ pedal pulses. No carotid bruits.  Abdomen: no tenderness, no masses palpated. No hepatosplenomegaly. Bowel sounds positive.  Musculoskeletal: no clubbing / cyanosis. No joint deformity upper and lower extremities. Good ROM, no contractures. Normal muscle tone.  Skin: no rashes, lesions, ulcers. No induration Neurologic: CN 2-12 grossly intact. Sensation intact, DTR normal. Strength 5/5 in all 4.  Psychiatric: Normal judgment and insight. Alert and oriented x 3. Normal mood.   (Anything < 9 systems with 2 bullets each down codes to level 1) (If patient refuses exam can't bill higher level) (Make sure to document decubitus ulcers present on admission -- if possible -- and whether patient has chronic indwelling catheter at time of admission)  Labs on Admission: I have personally reviewed following labs and imaging studies  CBC: Recent Labs  Lab  05/22/17 0031  WBC 13.1*  HGB 9.2*  HCT 28.4*  MCV 85.8  PLT 177   Basic Metabolic Panel: Recent Labs  Lab 05/22/17 0031  NA 132*  K 3.4*  CL 99*  CO2 25  GLUCOSE 126*  BUN 12  CREATININE 0.67  CALCIUM 8.2*   GFR: Estimated Creatinine Clearance: 137.4 mL/min (by C-G formula based on SCr of 0.67 mg/dL). Liver Function Tests: Recent Labs  Lab 05/22/17 0522  AST 31  ALT 20  ALKPHOS 83  BILITOT 0.4  PROT 6.9  ALBUMIN 2.3*   Recent Labs  Lab 05/22/17 0522  LIPASE 25   No results for input(s): AMMONIA in the last 168 hours. Coagulation Profile: No results for input(s): INR, PROTIME in the last 168 hours. Cardiac Enzymes: No results for input(s): CKTOTAL, CKMB, CKMBINDEX, TROPONINI in the last 168 hours. BNP (last 3 results) No results for input(s): PROBNP in the last 8760 hours. HbA1C: No results for input(s): HGBA1C in the last 72 hours. CBG: No results for input(s): GLUCAP in the last 168 hours. Lipid Profile: No results for input(s): CHOL, HDL, LDLCALC, TRIG, CHOLHDL, LDLDIRECT in the last 72 hours. Thyroid Function Tests: No results for input(s): TSH, T4TOTAL, FREET4, T3FREE, THYROIDAB in the last 72 hours. Anemia Panel: No results for input(s): VITAMINB12, FOLATE, FERRITIN, TIBC, IRON,  RETICCTPCT in the last 72 hours. Urine analysis:    Component Value Date/Time   COLORURINE YELLOW 04/26/2017 [place] 04/26/2017 1840   LABSPEC 1.010 04/26/2017 1840   PHURINE 6.0 04/26/2017 1840   GLUCOSEU NEGATIVE 04/26/2017 1840   HGBUR NEGATIVE 04/26/2017 1840   BILIRUBINUR NEGATIVE 04/26/2017 1840   KETONESUR NEGATIVE 04/26/2017 1840   PROTEINUR NEGATIVE 04/26/2017 1840   NITRITE NEGATIVE 04/26/2017 1840   LEUKOCYTESUR NEGATIVE 04/26/2017 1840   Sepsis Labs: @LABRCNTIP (procalcitonin:4,lacticidven:4) )No results found for this or any previous visit (from the past 240 hour(s)).   Radiological Exams on Admission: Dg Chest 2 View  Result  Date: 05/22/2017 CLINICAL DATA:  Acute onset of generalized body aches and shortness of breath. EXAM: CHEST  2 VIEW COMPARISON:  Chest radiograph performed 04/26/2017 FINDINGS: The lungs are well-aerated. Mild left basilar airspace opacity may reflect atelectasis or possibly mild pneumonia. There is no evidence of pleural effusion or pneumothorax. The heart is normal in size; the mediastinal contour is within normal limits. No acute osseous abnormalities are seen. IMPRESSION: Mild left basilar airspace opacity may reflect atelectasis or possibly mild pneumonia. Electronically Signed   By: Garald Balding M.D.   On: 05/22/2017 02:00   Ct Angio Chest Pe W And/or Wo Contrast  Addendum Date: 05/22/2017   ADDENDUM REPORT: 05/22/2017 10:47 ADDENDUM: Transcription error resulted in the deletion of the impression. IMPRESSION: Suboptimal pulmonary arterial opacification although no large central pulmonary embolus is noted. Mild bibasilar atelectatic changes with small left pleural effusion. Electronically Signed   By: Inez Catalina M.D.   On: 05/22/2017 10:47   Result Date: 05/22/2017 CLINICAL DATA:  Shortness of breath and chest pain for 1 week, history of IV drug use and hepatitis-C. EXAM: CT ANGIOGRAPHY CHEST WITH CONTRAST TECHNIQUE: Multidetector CT imaging of the chest was performed using the standard protocol during bolus administration of intravenous contrast. Multiplanar CT image reconstructions and MIPs were obtained to evaluate the vascular anatomy. CONTRAST:  142m ISOVUE-370 COMPARISON:  Chest x-ray from earlier in the same day. FINDINGS: Cardiovascular: Thoracic aorta is within normal limits without atherosclerotic change. No significant cardiac enlargement is noted. No coronary calcifications are seen. The pulmonary artery opacification is somewhat less than optimal mall although no large central pulmonary emboli are seen. Mediastinum/Nodes: Thoracic inlet is within normal limits. Small likely reactive  hilar lymph nodes are noted bilaterally. No sizable mediastinal or hilar adenopathy is noted. The esophagus is within normal limits. Lungs/Pleura: Small left pleural effusion is identified. Some minimal bibasilar atelectatic changes are seen. Upper Abdomen: Upper abdomen demonstrates the liver to be mildly prominent. This is consistent with the given clinical history. Musculoskeletal: Mild degenerative changes of the thoracic spine are noted. No acute bony abnormality is seen. Review of the MIP images confirms the above findings. Electronically Signed: By: MInez CatalinaM.D. On: 05/22/2017 08:43   Mr Cervical Spine W Wo Contrast  Result Date: 05/22/2017 CLINICAL DATA:  Back pain.  IV drug use. EXAM: MRI CERVICAL, THORACIC, AND LUMBAR SPINE WITHOUT AND WITH CONTRAST TECHNIQUE: Multiplanar and multiecho pulse sequences of the cervical spine, to include the craniocervical junction and cervicothoracic junction, and thoracic and lumbar spine were obtained without and with intravenous contrast. CONTRAST:  163mMULTIHANCE GADOBENATE DIMEGLUMINE 529 MG/ML IV SOLN COMPARISON:  Chest CTA 05/22/2017 FINDINGS: There is mild-to-moderate motion artifact throughout the examinations. MRI CERVICAL SPINE FINDINGS Alignment: No listhesis. Vertebrae: Diffusely decreased bone marrow signal intensity likely related to patient's anemia. No fracture, suspicious focal  osseous lesion, or evidence of discitis/osteomyelitis. No epidural collection. Cord: Normal signal and morphology. No abnormal intradural enhancement. Posterior Fossa, vertebral arteries, paraspinal tissues: Unremarkable. Disc levels: Minimal disc bulging from C3-4 to C5-6. No evidence of significant spinal or neural foraminal stenosis. MRI THORACIC SPINE FINDINGS Alignment:  Normal. Vertebrae: Diffusely decreased bone marrow signal intensity likely related to patient's anemia. Scattered Schmorl's nodes, most notably in the T11 and T12 superior endplates. Mild endplate  edema and enhancement asymmetric to the right at T6-7 with an associated right lateral T6 inferior endplate Schmorl's node and bulky right lateral spurring on CT, all felt to be degenerative and without adjacent paraspinal soft tissue inflammation. No evidence of discitis/osteomyelitis elsewhere in the thoracic spine. No fracture or suspicious osseous lesion. No epidural collection. Cord: Normal signal and morphology. No abnormal intradural enhancement. Paraspinal and other soft tissues: Small left pleural effusion and dependent atelectasis in both lungs as shown on the recent CTA. Disc levels: Mild disc degeneration with scattered mild disc bulging and endplate spurring throughout the upper and midthoracic spine. No significant spinal stenosis, neural foraminal stenosis, or spinal cord compression. MRI LUMBAR SPINE FINDINGS Segmentation:  Standard. Alignment:  Normal. Vertebrae: Diffusely decreased bone marrow signal intensity likely related the patient's anemia. Nonspecific 9 mm T2 hyperintense focus in the posterior L1 vertebral body. There is moderate edema and enhancement involving the L3 superior greater than L2 inferior endplates, asymmetric to the right. There are small Schmorl's node type deformities in the L3 superior endplate with a limbus vertebra configuration, and there is a small amount of STIR hyperintense signal in the L2-3 disc space without disc enhancement. There is underlying disc space narrowing and disc desiccation at L2-3. There is no evidence of discitis/osteomyelitis elsewhere in the lumbar spine. No epidural fluid collection. Conus medullaris: Extends to the L1 level and appears normal. Paraspinal and other soft tissues: Right greater than left paravertebral soft tissue inflammation at L2-3 with prominent inflammation extending throughout much of the right psoas muscle. There is a 20 x 6 mm fluid collection in the right psoas muscle at L3. There is symmetric diffuse posterior paraspinal  muscle edema bilaterally, greatest in the lower lumbar spine without underlying fluid collection. Disc levels: Underlying disc degeneration at L2-3 with mild disc bulging but no stenosis. Mild disc desiccation and minimal bulging at L5-S1 without stenosis. Normal discs at L1-2, L3-4, and L4-5. IMPRESSION: 1. Findings consistent with discitis/osteomyelitis at L2-3. Right greater than left paravertebral soft tissue inflammation with extensive right psoas inflammation/myositis. 20 x 6 mm right psoas abscess. 2. Diffuse posterior paraspinal muscle inflammation in the lumbar spine suggestive of myositis. 3. No evidence of infection in the cervical or thoracic spine. No epidural abscess. Electronically Signed   By: Logan Bores M.D.   On: 05/22/2017 16:26   Mr Thoracic Spine W Wo Contrast  Result Date: 05/22/2017 CLINICAL DATA:  Back pain.  IV drug use. EXAM: MRI CERVICAL, THORACIC, AND LUMBAR SPINE WITHOUT AND WITH CONTRAST TECHNIQUE: Multiplanar and multiecho pulse sequences of the cervical spine, to include the craniocervical junction and cervicothoracic junction, and thoracic and lumbar spine were obtained without and with intravenous contrast. CONTRAST:  49m MULTIHANCE GADOBENATE DIMEGLUMINE 529 MG/ML IV SOLN COMPARISON:  Chest CTA 05/22/2017 FINDINGS: There is mild-to-moderate motion artifact throughout the examinations. MRI CERVICAL SPINE FINDINGS Alignment: No listhesis. Vertebrae: Diffusely decreased bone marrow signal intensity likely related to patient's anemia. No fracture, suspicious focal osseous lesion, or evidence of discitis/osteomyelitis. No epidural collection. Cord: Normal signal  and morphology. No abnormal intradural enhancement. Posterior Fossa, vertebral arteries, paraspinal tissues: Unremarkable. Disc levels: Minimal disc bulging from C3-4 to C5-6. No evidence of significant spinal or neural foraminal stenosis. MRI THORACIC SPINE FINDINGS Alignment:  Normal. Vertebrae: Diffusely decreased  bone marrow signal intensity likely related to patient's anemia. Scattered Schmorl's nodes, most notably in the T11 and T12 superior endplates. Mild endplate edema and enhancement asymmetric to the right at T6-7 with an associated right lateral T6 inferior endplate Schmorl's node and bulky right lateral spurring on CT, all felt to be degenerative and without adjacent paraspinal soft tissue inflammation. No evidence of discitis/osteomyelitis elsewhere in the thoracic spine. No fracture or suspicious osseous lesion. No epidural collection. Cord: Normal signal and morphology. No abnormal intradural enhancement. Paraspinal and other soft tissues: Small left pleural effusion and dependent atelectasis in both lungs as shown on the recent CTA. Disc levels: Mild disc degeneration with scattered mild disc bulging and endplate spurring throughout the upper and midthoracic spine. No significant spinal stenosis, neural foraminal stenosis, or spinal cord compression. MRI LUMBAR SPINE FINDINGS Segmentation:  Standard. Alignment:  Normal. Vertebrae: Diffusely decreased bone marrow signal intensity likely related the patient's anemia. Nonspecific 9 mm T2 hyperintense focus in the posterior L1 vertebral body. There is moderate edema and enhancement involving the L3 superior greater than L2 inferior endplates, asymmetric to the right. There are small Schmorl's node type deformities in the L3 superior endplate with a limbus vertebra configuration, and there is a small amount of STIR hyperintense signal in the L2-3 disc space without disc enhancement. There is underlying disc space narrowing and disc desiccation at L2-3. There is no evidence of discitis/osteomyelitis elsewhere in the lumbar spine. No epidural fluid collection. Conus medullaris: Extends to the L1 level and appears normal. Paraspinal and other soft tissues: Right greater than left paravertebral soft tissue inflammation at L2-3 with prominent inflammation extending  throughout much of the right psoas muscle. There is a 20 x 6 mm fluid collection in the right psoas muscle at L3. There is symmetric diffuse posterior paraspinal muscle edema bilaterally, greatest in the lower lumbar spine without underlying fluid collection. Disc levels: Underlying disc degeneration at L2-3 with mild disc bulging but no stenosis. Mild disc desiccation and minimal bulging at L5-S1 without stenosis. Normal discs at L1-2, L3-4, and L4-5. IMPRESSION: 1. Findings consistent with discitis/osteomyelitis at L2-3. Right greater than left paravertebral soft tissue inflammation with extensive right psoas inflammation/myositis. 20 x 6 mm right psoas abscess. 2. Diffuse posterior paraspinal muscle inflammation in the lumbar spine suggestive of myositis. 3. No evidence of infection in the cervical or thoracic spine. No epidural abscess. Electronically Signed   By: Logan Bores M.D.   On: 05/22/2017 16:26   Mr Lumbar Spine W Wo Contrast  Result Date: 05/22/2017 CLINICAL DATA:  Back pain.  IV drug use. EXAM: MRI CERVICAL, THORACIC, AND LUMBAR SPINE WITHOUT AND WITH CONTRAST TECHNIQUE: Multiplanar and multiecho pulse sequences of the cervical spine, to include the craniocervical junction and cervicothoracic junction, and thoracic and lumbar spine were obtained without and with intravenous contrast. CONTRAST:  46m MULTIHANCE GADOBENATE DIMEGLUMINE 529 MG/ML IV SOLN COMPARISON:  Chest CTA 05/22/2017 FINDINGS: There is mild-to-moderate motion artifact throughout the examinations. MRI CERVICAL SPINE FINDINGS Alignment: No listhesis. Vertebrae: Diffusely decreased bone marrow signal intensity likely related to patient's anemia. No fracture, suspicious focal osseous lesion, or evidence of discitis/osteomyelitis. No epidural collection. Cord: Normal signal and morphology. No abnormal intradural enhancement. Posterior Fossa, vertebral arteries, paraspinal tissues:  Unremarkable. Disc levels: Minimal disc bulging from  C3-4 to C5-6. No evidence of significant spinal or neural foraminal stenosis. MRI THORACIC SPINE FINDINGS Alignment:  Normal. Vertebrae: Diffusely decreased bone marrow signal intensity likely related to patient's anemia. Scattered Schmorl's nodes, most notably in the T11 and T12 superior endplates. Mild endplate edema and enhancement asymmetric to the right at T6-7 with an associated right lateral T6 inferior endplate Schmorl's node and bulky right lateral spurring on CT, all felt to be degenerative and without adjacent paraspinal soft tissue inflammation. No evidence of discitis/osteomyelitis elsewhere in the thoracic spine. No fracture or suspicious osseous lesion. No epidural collection. Cord: Normal signal and morphology. No abnormal intradural enhancement. Paraspinal and other soft tissues: Small left pleural effusion and dependent atelectasis in both lungs as shown on the recent CTA. Disc levels: Mild disc degeneration with scattered mild disc bulging and endplate spurring throughout the upper and midthoracic spine. No significant spinal stenosis, neural foraminal stenosis, or spinal cord compression. MRI LUMBAR SPINE FINDINGS Segmentation:  Standard. Alignment:  Normal. Vertebrae: Diffusely decreased bone marrow signal intensity likely related the patient's anemia. Nonspecific 9 mm T2 hyperintense focus in the posterior L1 vertebral body. There is moderate edema and enhancement involving the L3 superior greater than L2 inferior endplates, asymmetric to the right. There are small Schmorl's node type deformities in the L3 superior endplate with a limbus vertebra configuration, and there is a small amount of STIR hyperintense signal in the L2-3 disc space without disc enhancement. There is underlying disc space narrowing and disc desiccation at L2-3. There is no evidence of discitis/osteomyelitis elsewhere in the lumbar spine. No epidural fluid collection. Conus medullaris: Extends to the L1 level and appears  normal. Paraspinal and other soft tissues: Right greater than left paravertebral soft tissue inflammation at L2-3 with prominent inflammation extending throughout much of the right psoas muscle. There is a 20 x 6 mm fluid collection in the right psoas muscle at L3. There is symmetric diffuse posterior paraspinal muscle edema bilaterally, greatest in the lower lumbar spine without underlying fluid collection. Disc levels: Underlying disc degeneration at L2-3 with mild disc bulging but no stenosis. Mild disc desiccation and minimal bulging at L5-S1 without stenosis. Normal discs at L1-2, L3-4, and L4-5. IMPRESSION: 1. Findings consistent with discitis/osteomyelitis at L2-3. Right greater than left paravertebral soft tissue inflammation with extensive right psoas inflammation/myositis. 20 x 6 mm right psoas abscess. 2. Diffuse posterior paraspinal muscle inflammation in the lumbar spine suggestive of myositis. 3. No evidence of infection in the cervical or thoracic spine. No epidural abscess. Electronically Signed   By: Logan Bores M.D.   On: 05/22/2017 16:26    EKG: Pending   Assessment/Plan  Principal Problem:   Sepsis ([place]) / Osteomyelitis ([place]) / Discitis of lumbar region / Leukocytosis - Sepsis criteria met on admission with tachycardia, tachypnea, leukocytosis and source of infection osteomyelitis as evidenced on MRI - Lactic acid is WNL - Procalcitonin level pending  - CXR showed mild left basilar airspace opacity may reflect atelectasis or possibly mild pneumonia. - MRI lumbar spine showed findings consistent with discitis/osteomyelitis at L2-3. Right greater than left paravertebral soft tissue inflammation with extensive right psoas inflammation/myositis. 20 x 6 mm right psoas abscess. Diffuse posterior paraspinal muscle inflammation in the lumbar spine suggestive of myositis. No evidence of infection in the cervical or thoracic spine. No epidural abscess. - Blood cultures and urine culture  ordered - Started broad spectrum antibiotics: vanco and zosyn   Active Problems:  Pleuritic chest pain - CT angio chest did not show pulmonary embolism - Obtain trop level    IVDU (intravenous drug user) - UDS positive for opiates and amphetamines     Normocytic anemia - Hgb stable     Hypokalemia  - Supplemented - Follow up BMP in am    DVT prophylaxis: Lovenox subQ  Code Status: full code Family Communication: no family at the bedside  Disposition Plan: telemetry  Consults called: neurosurgery   Disposition plan: Further plan will depend as patient's clinical course evolves and further radiologic and laboratory data become available.    At the time of admission, it appears that the appropriate admission status for this patient is INPATIENT .Thisis judged to be reasonable and necessary in order to provide the required intensity of service to ensure the patient's safetygiven the patient presentation of osteomyelitis / discitis  in addition to physical exam findings, radiographic and laboratory data in the context of chronic comorbidities.   Time Spent  Critical care time spent : 65 minutes examining the patient, discussing with EDP, critical care physician, coordinating care and management.The medical decision making on this patient was of high complexity, the critically ill patient is at high risk for clinical deterioration, therefore this is a level 3 visit.    Leisa Lenz MD [facility] Pager [phone]  If 7PM-7AM, please contact night-coverage [email or web address] Password [email or web address]  05/22/2017, 5:30 PM

## 2017-05-22 NOTE — Progress Notes (Signed)
Hunter [name] is a 31 y.o. male patient admitted from ED awake, alert - oriented  X 4 - no acute distress noted.  VSS - Blood pressure 130/88, pulse 86, temperature 98.2 F (36.8 C), temperature source Oral, resp. rate (!) 21, height 6\' 1"  (1.854 m), weight 72.6 kg (160 lb), SpO2 100 %.    IV in place, occlusive dsg intact without redness.  Orientation to room, and floor completed with information packet given to patient/family.  Patient declined safety video at this time.  Admission INP armband ID verified with patient/family, and in place.   SR up x 2, fall assessment complete, with patient and family able to verbalize understanding of risk associated with falls, and verbalized understanding to call nsg before up out of bed.  Call light within reach, patient able to voice, and demonstrate understanding.  Skin, clean-dry- intact without evidence of bruising, or skin tears.   No evidence of skin break down noted on exam.     Will cont to eval and treat per MD orders.  Theodosia BlenderEvan J Jaelan Rasheed, RN 05/22/2017 6:35 PM

## 2017-05-22 NOTE — ED Notes (Signed)
ED Provider at bedside. 

## 2017-05-22 NOTE — Progress Notes (Signed)
CSW received call regarding assistance with this patient. Patient is currently homeless and needs his medications refilled. CSW informed RN that she would need to contact RN CM for assistance with medication. CSW sent homeless resources via tube to be given to patient.  CSW signing off.  Hunter [name], MSW, LCSW-A Weekend Clinical Social Worker [phone]

## 2017-05-22 NOTE — ED Notes (Signed)
To ct

## 2017-05-22 NOTE — Progress Notes (Signed)
Pharmacy Antibiotic Note  Hunter [name] is a 31 y.o. male admitted on 05/22/2017 with sepsis.  Pharmacy has been consulted for vancomycin and zosyn dosing. -WBC= 13.1, tmax= 99.9, LA= 1.1, CrCl > 100 -zosyn 3.375gm IV given ~ 12:30pm and vanc 1gm given at ~ 11am  Plan: -Zosyn 3.375 gm IV q8h -Vancomycin 500mg  IV now  (for total load of 1500mg ) followed by 1000mg  IV q8h -Will follow renal function, cultures and clinical progress   Height: 6\' 1"  (185.4 cm) Weight: 160 lb (72.6 kg) IBW/kg (Calculated) : 79.9  Temp (24hrs), Avg:99 F (37.2 C), Min:98.2 F (36.8 C), Max:99.9 F (37.7 C)  Recent Labs  Lab 05/22/17 0031 05/22/17 0535  WBC 13.1*  --   CREATININE 0.67  --   LATICACIDVEN  --  1.10    Estimated Creatinine Clearance: 137.4 mL/min (by C-G formula based on SCr of 0.67 mg/dL).    No Known Allergies  Antimicrobials this admission: 12/15 vanc 12/15 zosyn  Dose adjustments this admission:   Microbiology results: 12/15 blood x2  Thank you for allowing pharmacy to be a part of this patient's care.  Hunter [name], Pharm D 05/22/2017 5:28 PM

## 2017-05-22 NOTE — ED Provider Notes (Signed)
[facility] Provider Note   CSN: [id number] Arrival date & time: 05/21/17  2355     History   Chief Complaint Chief Complaint  Patient presents with  . Shortness of Breath    HPI Hunter [name] is a 31 y.o. male.  The history is provided by the patient and medical records. No language interpreter was used.  Shortness of Breath  Associated symptoms include chest pain. Pertinent negatives include no cough and no leg swelling.   Hunter [name] is a 31 y.o. male  with a PMH of IV drug use, hepatitis C who presents to the Emergency Department complaining of shortness of breath x 3-4 days. Pain is worse with taking deep breaths and with touching the area. Patient states that his trouble breathing is due to "gas bubbles popping". He will point to his chest and ask if I see the gas bubbles, however noting visible on the skin. He does endorse doing meth either yesterday or today, he is unsure. Associated with generalized body aches and chest pain. Denies fever, chills, cough, congestion.  Patient was admitted to the hospital about a month ago (11/19) for right forearm deep space abscess and strep pyogenes bacteremia. He did undergo surgery for abscess on 11/19 and discharged on 11/24. He notes mostly staying at home and in bed since discharge. He feels as if right arm is improving.   Past Medical History:  Diagnosis Date  . Chronic hepatitis C without hepatic coma (HCC) 04/28/2017  . Hepatitis-C   . IVDU (intravenous drug user)     Patient Active Problem List   Diagnosis Date Noted  . Streptococcal bacteremia 04/28/2017  . Normocytic anemia 04/28/2017  . Chronic hepatitis C without hepatic coma (HCC) 04/28/2017  . Cellulitis 04/27/2017  . Abscess of forearm 04/27/2017  . Hyponatremia 04/27/2017  . IVDU (intravenous drug user) 04/27/2017    Past Surgical History:  Procedure Laterality Date  . I&D EXTREMITY Right 04/26/2017   Procedure:  IRRIGATION AND DEBRIDEMENT RIGHT HAND AND FOREARM;  Surgeon: Bradly Bienenstockrtmann, Fred, MD;  Location: [facility] OR;  Service: Orthopedics;  Laterality: Right;  . TEE WITHOUT CARDIOVERSION N/A 04/30/2017   Procedure: TRANSESOPHAGEAL ECHOCARDIOGRAM (TEE);  Surgeon: Quintella Reicherturner, Traci R, MD;  [facility];  Service: Cardiovascular;  Laterality: N/A;  . TONSILLECTOMY         Home Medications    Prior to Admission medications   Medication Sig Start Date End Date Taking? Authorizing Provider  acetaminophen (TYLENOL) 325 MG tablet Take 2 tablets (650 mg total) by mouth every 6 (six) hours as needed for mild pain (or Fever >/= 101). Patient not taking: Reported on 05/22/2017 05/01/17   Standley BrookingGoodrich, Daniel P, MD  amoxicillin-clavulanate (AUGMENTIN) 875-125 MG tablet Take 1 tablet by mouth 2 (two) times daily for 28 days. Patient not taking: Reported on 05/22/2017 05/01/17 05/29/17  Standley BrookingGoodrich, Daniel P, MD    Family History History reviewed. No pertinent family history.  Social History Social History   Tobacco Use  . Smoking status: Current Every Day Smoker    Types: Cigarettes  . Smokeless tobacco: Never Used  Substance Use Topics  . Alcohol use: No    Frequency: Never  . Drug use: Yes    Types: IV    Comment: heroin     Allergies   Patient has no known allergies.   Review of Systems Review of Systems  Respiratory: Positive for shortness of breath. Negative for cough.   Cardiovascular: Positive for chest pain.  Negative for palpitations and leg swelling.  All other systems reviewed and are negative.    Physical Exam Updated Vital Signs BP 120/75   Pulse 95   Temp 99.9 F (37.7 C) (Oral)   Resp (!) 28   Ht 6\' 1"  (1.854 m)   Wt 72.6 kg (160 lb)   SpO2 100%   BMI 21.11 kg/m   Physical Exam  Constitutional: He is oriented to person, place, and time. He appears well-developed and well-nourished. No distress.  HENT:  Head: Normocephalic and atraumatic.  Cardiovascular: Normal rate,  regular rhythm and normal heart sounds.  No murmur heard. Pulmonary/Chest: Effort normal. No respiratory distress. He exhibits tenderness.  Faint bibasilar crackles.  Abdominal: Soft. He exhibits no distension. There is no tenderness.  Musculoskeletal: He exhibits no edema.  Neurological: He is alert and oriented to person, place, and time.  Skin: Skin is warm and dry.  Nursing note and vitals reviewed.    ED Treatments / Results  Labs (all labs ordered are listed, but only abnormal results are displayed) Labs Reviewed  CBC - Abnormal; Notable for the following components:      Result Value   WBC 13.1 (*)    RBC 3.31 (*)    Hemoglobin 9.2 (*)    HCT 28.4 (*)    All other components within normal limits  BASIC METABOLIC PANEL - Abnormal; Notable for the following components:   Sodium 132 (*)    Potassium 3.4 (*)    Chloride 99 (*)    Glucose, Bld 126 (*)    Calcium 8.2 (*)    All other components within normal limits  CULTURE, BLOOD (ROUTINE X 2)  CULTURE, BLOOD (ROUTINE X 2)  HEPATIC FUNCTION PANEL  LIPASE, BLOOD  I-STAT CG4 LACTIC ACID, ED  I-STAT CG4 LACTIC ACID, ED    EKG  EKG Interpretation  Date/Time:  Saturday May 22 2017 00:10:50 EST Ventricular Rate:  114 PR Interval:  120 QRS Duration: 92 QT Interval:  308 QTC Calculation: 424 R Axis:   50 Text Interpretation:  Sinus tachycardia Otherwise normal ECG No previous ECGs available Confirmed by Glynn Octave [phone]) on 05/22/2017 5:30:55 AM       Radiology Dg Chest 2 View  Result Date: 05/22/2017 CLINICAL DATA:  Acute onset of generalized body aches and shortness of breath. EXAM: CHEST  2 VIEW COMPARISON:  Chest radiograph performed 04/26/2017 FINDINGS: The lungs are well-aerated. Mild left basilar airspace opacity may reflect atelectasis or possibly mild pneumonia. There is no evidence of pleural effusion or pneumothorax. The heart is normal in size; the mediastinal contour is within normal limits.  No acute osseous abnormalities are seen. IMPRESSION: Mild left basilar airspace opacity may reflect atelectasis or possibly mild pneumonia. Electronically Signed   By: Roanna Raider M.D.   On: 05/22/2017 02:00    Procedures Procedures (including critical care time)  Medications Ordered in ED Medications - No data to display   Initial Impression / Assessment and Plan / ED Course  I have reviewed the triage vital signs and the nursing notes.  Pertinent labs & imaging results that were available during my care of the patient were reviewed by me and considered in my medical decision making (see chart for details).    Hunter Bellow is a 31 y.o. male with hx of IVDU and recent hospitalization for bacteremia 2/2 right forearm abscess who presents to ED for shortness of breath x 3-4 days associated with pleuritic chest pain. Temp 99.9 in  ED. Initially tachycardic in triage. RRR on exam. Lungs with faint bibasilar crackles. EKG with mild tachycardia, but NSR. Labs reviewed: leukocytosis of 13.1. Lactic wdl. Mild elyte abnormalities. Given recent admission for bacteremia with low grade temp today, blood cx's obtained. CXR shows mild left basilar opacity atelectasis vs. PNA.  Given initial tachycardia, recent hospitalization/immobilization and history of IV drug use with pleuritic chest pain, CT angios was obtained to assess for PE/septic emboli.  Imaging with no PE.  Small left pleural effusion and atelectasis.  Patient reevaluated.  Heart rate normalized in the 70s-80s.  BP WDL.  Afebrile with temp of 98.9. When discussing reassuring imaging and plan for discharge to home with oral ABX's, patient states that he would not be able to afford ABX and needed to stay in the hospital. I asked patient why he felt that he needed to stay in the hospital and patient responded "I cannot walk". When asked why he could not walk, patient reported that his back hurt too badly. Patient with pain out of proportion to exam  with palpating any part of his body and will jump out of the bed and moan, however he does have tenderness along T&L spine. Given hx of IVDU and recent bacteremia, cannot rule out epidural abscess. Case discussed with day-time attending who agrees with plan for MRI of the spine.   MRI pending at shift change. Care assumed by oncoming provider PA McDonald. Case discussed, plan agreed upon. Will follow up on pending MRI and re-evaluate patient. If MRI negative and patient clinically appears well, likely discharge with rx for azithromycin and continue augmentin. Social work has spoken with patient and aided in prescription assistance.    Final Clinical Impressions(s) / ED Diagnoses   Final diagnoses:  None    ED Discharge Orders    None       Ward, Chase PicketJaime Pilcher, PA-C 05/22/17 1614    Glynn Octaveancour, Stephen, MD 05/22/17 2054

## 2017-05-22 NOTE — ED Notes (Signed)
Social worker and case worker called to get pts rx filled

## 2017-05-22 NOTE — ED Notes (Signed)
Report attempted 

## 2017-05-22 NOTE — Progress Notes (Signed)
[facility].

## 2017-05-22 NOTE — Progress Notes (Signed)
Received report on pt.

## 2017-05-22 NOTE — ED Notes (Signed)
Pt is moaning and appears diaphoretic.  NF notified.

## 2017-05-22 NOTE — ED Notes (Signed)
Information from Child psychotherapistsocial worker (bus tickets, food banks, homeless shelters) given to patient upon d/c to inpatient room. Pt aware and verbalizes understanding.

## 2017-05-23 DIAGNOSIS — M4646 Discitis, unspecified, lumbar region: Secondary | ICD-10-CM

## 2017-05-23 DIAGNOSIS — D72829 Elevated white blood cell count, unspecified: Secondary | ICD-10-CM

## 2017-05-23 DIAGNOSIS — F1110 Opioid abuse, uncomplicated: Secondary | ICD-10-CM

## 2017-05-23 DIAGNOSIS — K6812 Psoas muscle abscess: Secondary | ICD-10-CM

## 2017-05-23 DIAGNOSIS — R7881 Bacteremia: Secondary | ICD-10-CM

## 2017-05-23 DIAGNOSIS — F17210 Nicotine dependence, cigarettes, uncomplicated: Secondary | ICD-10-CM

## 2017-05-23 DIAGNOSIS — B9561 Methicillin susceptible Staphylococcus aureus infection as the cause of diseases classified elsewhere: Secondary | ICD-10-CM

## 2017-05-23 DIAGNOSIS — M8658 Other chronic hematogenous osteomyelitis, other site: Secondary | ICD-10-CM

## 2017-05-23 LAB — BLOOD CULTURE ID PANEL (REFLEXED)
Acinetobacter baumannii: NOT DETECTED
CANDIDA ALBICANS: NOT DETECTED
CANDIDA GLABRATA: NOT DETECTED
CANDIDA KRUSEI: NOT DETECTED
CANDIDA PARAPSILOSIS: NOT DETECTED
Candida tropicalis: NOT DETECTED
ENTEROCOCCUS SPECIES: NOT DETECTED
Enterobacter cloacae complex: NOT DETECTED
Enterobacteriaceae species: NOT DETECTED
Escherichia coli: NOT DETECTED
HAEMOPHILUS INFLUENZAE: NOT DETECTED
KLEBSIELLA PNEUMONIAE: NOT DETECTED
Klebsiella oxytoca: NOT DETECTED
Listeria monocytogenes: NOT DETECTED
Methicillin resistance: NOT DETECTED
NEISSERIA MENINGITIDIS: NOT DETECTED
PROTEUS SPECIES: NOT DETECTED
Pseudomonas aeruginosa: NOT DETECTED
SERRATIA MARCESCENS: NOT DETECTED
STREPTOCOCCUS AGALACTIAE: NOT DETECTED
STREPTOCOCCUS PNEUMONIAE: NOT DETECTED
STREPTOCOCCUS PYOGENES: NOT DETECTED
Staphylococcus aureus (BCID): DETECTED — AB
Staphylococcus species: DETECTED — AB
Streptococcus species: NOT DETECTED

## 2017-05-23 MED ORDER — ENOXAPARIN SODIUM 40 MG/0.4ML ~~LOC~~ SOLN
40.0000 mg | SUBCUTANEOUS | Status: DC
  Administered 2017-05-23 – 2017-05-30 (×8): 40 mg via SUBCUTANEOUS
  Filled 2017-05-23 (×18): qty 0.4

## 2017-05-23 MED ORDER — POTASSIUM CHLORIDE CRYS ER 20 MEQ PO TBCR
40.0000 meq | EXTENDED_RELEASE_TABLET | Freq: Once | ORAL | Status: AC
  Administered 2017-05-23: 40 meq via ORAL
  Filled 2017-05-23: qty 2

## 2017-05-23 MED ORDER — CEFAZOLIN SODIUM-DEXTROSE 2-4 GM/100ML-% IV SOLN
2.0000 g | Freq: Three times a day (TID) | INTRAVENOUS | Status: DC
  Administered 2017-05-23 – 2017-07-03 (×124): 2 g via INTRAVENOUS
  Filled 2017-05-23 (×129): qty 100

## 2017-05-23 NOTE — Progress Notes (Signed)
PHARMACY - PHYSICIAN COMMUNICATION CRITICAL VALUE ALERT - BLOOD CULTURE IDENTIFICATION (BCID)  Hunter [name] is an 31 y.o. male [facility] with a chief complaint of sepsis  Assessment:  osteomyelitis (include suspected source if known)  Name of physician (or Provider) Contacted:   Current antibiotics: Vancomycin 1 gm IV Q 8 hours   Changes to prescribed antibiotics recommended:  Patient is on recommended antibiotics - No changes needed  Results for orders placed or performed during the hospital encounter of 05/22/17  Blood Culture ID Panel (Reflexed) (Collected: 05/22/2017  5:15 AM)  Result Value Ref Range   Enterococcus species NOT DETECTED NOT DETECTED   Listeria monocytogenes NOT DETECTED NOT DETECTED   Staphylococcus species DETECTED (A) NOT DETECTED   Staphylococcus aureus DETECTED (A) NOT DETECTED   Methicillin resistance NOT DETECTED NOT DETECTED   Streptococcus species NOT DETECTED NOT DETECTED   Streptococcus agalactiae NOT DETECTED NOT DETECTED   Streptococcus pneumoniae NOT DETECTED NOT DETECTED   Streptococcus pyogenes NOT DETECTED NOT DETECTED   Acinetobacter baumannii NOT DETECTED NOT DETECTED   Enterobacteriaceae species NOT DETECTED NOT DETECTED   Enterobacter cloacae complex NOT DETECTED NOT DETECTED   Escherichia coli NOT DETECTED NOT DETECTED   Klebsiella oxytoca NOT DETECTED NOT DETECTED   Klebsiella pneumoniae NOT DETECTED NOT DETECTED   Proteus species NOT DETECTED NOT DETECTED   Serratia marcescens NOT DETECTED NOT DETECTED   Haemophilus influenzae NOT DETECTED NOT DETECTED   Neisseria meningitidis NOT DETECTED NOT DETECTED   Pseudomonas aeruginosa NOT DETECTED NOT DETECTED   Candida albicans NOT DETECTED NOT DETECTED   Candida glabrata NOT DETECTED NOT DETECTED   Candida krusei NOT DETECTED NOT DETECTED   Candida parapsilosis NOT DETECTED NOT DETECTED   Candida tropicalis NOT DETECTED NOT DETECTED   Vinnie LevelBenjamin  Shyne Lehrke, PharmD., BCPS Clinical Pharmacist Pager [phone]

## 2017-05-23 NOTE — Evaluation (Signed)
Physical Therapy Evaluation Patient Details Name: Hunter [name] MRN: [id number] DOB: Feb 11, 1986 Today's Date: 05/23/2017   History of Present Illness  Hunter [name] is a 31 y.o. male with medical history significant for IVDA, recent hospitalization for right forearm deep abscess and strep pyogenes bacteremia, hepatitis C who presented to ED with reports of shortness of breath for past 3-4 days prior to the admission associated with chest pain especially when taking a deep breath. MRI showed discitis/osteomyelitis of lumbar spine; Sepsis (HCC) / Osteomyelitis (HCC) / Discitis of lumbar region / Leukocytosis  Clinical Impression   Pt admitted with above diagnosis. Pt currently with functional limitations due to the deficits listed below (see PT Problem List). Independent PTA; currently pain limiting all mobility, but Hunter [name] was able to walk hallways; As his medical status improves, and pain is more under control, I anticipate good progress;  Pt will benefit from skilled PT to increase their independence and safety with mobility to allow discharge to the venue listed below.       Follow Up Recommendations Other (comment)(Depends on progress, social situation; Will update as appropriate )    Equipment Recommendations  Rolling walker with 5" wheels;3in1 (PT)    Recommendations for Other Services OT consult(ordered per protocol)     Precautions / Restrictions Precautions Precautions: Fall;Back Precaution Comments: Back precautions for comfort      Mobility  Bed Mobility Overal bed mobility: Needs Assistance Bed Mobility: Rolling;Sidelying to Sit;Sit to Supine Rolling: Min guard Sidelying to sit: Min guard   Sit to supine: Min assist   General bed mobility comments: Cues for log roll technique; Assisted LEs back into bed; went supine to sit despite cue to go through sidelying  Transfers Overall transfer level: Needs assistance Equipment used: 1 person hand held  assist Transfers: Sit to/from Stand Sit to Stand: Min assist         General transfer comment: MIn assist to steady at initial stand  Ambulation/Gait Ambulation/Gait assistance: Min assist Ambulation Distance (Feet): 30 Feet Assistive device: 1 person hand held assist(and pusing IV pole) Gait Pattern/deviations: Decreased step length - right;Decreased step length - left;Decreased stride length     General Gait Details: Cues to self-monitor for activity tolerance; more reliant on handheld support towards teh end of walking  Stairs            Wheelchair Mobility    Modified Rankin (Stroke Patients Only)       Balance                                             Pertinent Vitals/Pain Pain Assessment: 0-10 Pain Score: 9  Pain Location: Back pain Pain Descriptors / Indicators: Aching;Constant;Grimacing;Guarding;Discomfort Pain Intervention(s): Monitored during session;RN gave pain meds during session;Limited activity within patient's tolerance    Home Living Family/patient expects to be discharged to:: Unsure(Currently experiencing homelessness) Living Arrangements: Non-relatives/Friends               Additional Comments: Will dc to an addiction rehab center be an option for Hunter [name]?    Prior Function Level of Independence: Independent               Hand Dominance        Extremity/Trunk Assessment   Upper Extremity Assessment Upper Extremity Assessment: Overall WFL for tasks assessed(though painful t move UEs)    Lower Extremity Assessment Lower  Extremity Assessment: Generalized weakness       Communication   Communication: No difficulties  Cognition Arousal/Alertness: Awake/alert Behavior During Therapy: WFL for tasks assessed/performed Overall Cognitive Status: Within Functional Limits for tasks assessed                                        General Comments      Exercises     Assessment/Plan     PT Assessment Patient needs continued PT services  PT Problem List Decreased strength;Decreased range of motion;Decreased activity tolerance;Decreased mobility;Decreased balance;Decreased coordination;Decreased knowledge of use of DME;Decreased knowledge of precautions;Pain       PT Treatment Interventions DME instruction;Gait training;Stair training;Functional mobility training;Therapeutic activities;Therapeutic exercise;Balance training;Neuromuscular re-education;Patient/family education    PT Goals (Current goals can be found in the Care Plan section)  Acute Rehab PT Goals Patient Stated Goal: less pain PT Goal Formulation: With patient Time For Goal Achievement: 06/06/17 Potential to Achieve Goals: Good    Frequency Min 3X/week   Barriers to discharge Other (comment) Experiencing homelessness    Co-evaluation               AM-PAC PT "6 Clicks" Daily Activity  Outcome Measure Difficulty turning over in bed (including adjusting bedclothes, sheets and blankets)?: A Lot Difficulty moving from lying on back to sitting on the side of the bed? : A Lot Difficulty sitting down on and standing up from a chair with arms (e.g., wheelchair, bedside commode, etc,.)?: A Little Help needed moving to and from a bed to chair (including a wheelchair)?: A Little Help needed walking in hospital room?: A Little Help needed climbing 3-5 steps with a railing? : A Lot 6 Click Score: 15    End of Session Equipment Utilized During Treatment: Gait belt Activity Tolerance: Patient limited by pain Patient left: in bed;with call bell/phone within reach Nurse Communication: Mobility status;Other (comment)(forget to set bed alarm) PT Visit Diagnosis: Unsteadiness on feet (R26.81);Other abnormalities of gait and mobility (R26.89);Pain Pain - part of body: (Back)    Time: [id number] PT Time Calculation (min) (ACUTE ONLY): 21 min   Charges:   PT Evaluation $PT Eval Moderate Complexity: 1 Mod      PT G Codes:        Van ClinesHolly Zylpha Poynor, PT  Acute Rehabilitation Services Pager [phone] Office [phone]   Levi AlandHolly H Lucianne Smestad 05/23/2017, 4:27 PM

## 2017-05-23 NOTE — Consult Note (Signed)
[facility]    Date of Admission:  05/22/2017           Day 2 vancomycin        Day 2 piperacillin tazobactam       Reason for Consult: MSSA bacteremia and lumbar infection    Referring Provider: Dr. Raphael Gibneyarol Hall  Assessment: He now has lumbar infection and staph aureus bacteremia. I will narrow his antibiotic therapy to cefazolin for now  Which will cover MSSA and group A strep. I doubt that it is worth trying to aspirate the L2-3 disc space or the small abscess. I will repeat blood cultures and order a transthoracic echocardiogram.   Plan: 1. Narrow antibiotic therapy to cefazolin 2. Repeat blood cultures 3. Transthoracic echocardiogram   Principal Problem:   Bacteremia due to methicillin susceptible Staphylococcus aureus (MSSA) Active Problems:   Discitis of lumbar region   Psoas abscess, right (HCC)   IVDU (intravenous drug user)   Chronic hepatitis C without hepatic coma (HCC)   Normocytic anemia   Hypokalemia   Scheduled Meds: . enoxaparin (LOVENOX) injection  40 mg Subcutaneous Q24H  . potassium chloride  40 mEq Oral Once  . sodium chloride flush  3 mL Intravenous Q12H   Continuous Infusions: . sodium chloride 75 mL/hr at 05/22/17 2041  . piperacillin-tazobactam (ZOSYN)  IV Stopped (05/23/17 1647)   PRN Meds:.acetaminophen, HYDROmorphone (DILAUDID) injection, ondansetron **OR** ondansetron (ZOFRAN) IV  HPI: Hunter [name] is a 31 y.o. male with recent injecting drug use who was admitted on 04/26/2017 with abscesses in his right hand and forearm. Blood cultures grew group A streptococcus. Abscess cultures grew group A streptococcus and MSSA. He was treated with IV antibiotics and then discharged on 05/01/2017 with a prescription for amoxicillin clavulanate. He did fill the prescription and took it for about 1 week. He then developed "gas bubbles" that caused him to burp infarct. He was concerned that this problem might have been  due to the antibiotic and stopped it 2 weeks ago. He says he has not been injecting drugs as much since he left the hospital because he has had severe lower back pain. He has had subjective fever, chills and sweats. He has been losing weight unintentionally. He says that he has been using drugs for about 5 years. He occasionally by Suboxone on the street but does not take this consistently. He was on methadone for about 3 months earlier this year and was able to stop using drugs on the street but then relapsed. He says that recently he has been injecting any drug he could find or buy. He was readmitted on 05/21/2017. MRI revealed evidence of L2-3 discitis with a small, 2 cm right psoas abscess. One of 2 admission blood cultures is growing MSSA.   Review of Systems: Review of Systems  Constitutional: Positive for chills, diaphoresis, fever, malaise/fatigue and weight loss.  HENT: Negative for sore throat.   Respiratory: Positive for shortness of breath. Negative for cough and sputum production.   Cardiovascular: Negative for chest pain.  Gastrointestinal: Negative for abdominal pain, diarrhea, heartburn, nausea and vomiting.  Genitourinary: Negative for dysuria.  Musculoskeletal: Positive for back pain.  Skin: Negative for rash.  Neurological: Negative for dizziness and headaches.  Psychiatric/Behavioral: Positive for substance abuse.    Past Medical History:  Diagnosis Date  . Chronic hepatitis C without hepatic coma (HCC) 04/28/2017  . Hepatitis-C   . IVDU (intravenous drug user)  Social History   Tobacco Use  . Smoking status: Current Every Day Smoker    Types: Cigarettes  . Smokeless tobacco: Never Used  Substance Use Topics  . Alcohol use: No    Frequency: Never  . Drug use: Yes    Types: IV    Comment: heroin    History reviewed. No pertinent family history. No Known Allergies  OBJECTIVE: Blood pressure 125/75, pulse 86, temperature 99 F (37.2 C), temperature  source Oral, resp. rate 18, height 6\' 1"  (1.854 m), weight 160 lb 6.4 oz (72.8 kg), SpO2 97 %.  Physical Exam  Constitutional: He is oriented to person, place, and time.  He is thin and appears frail and uncomfortable. He is diaphoretic.  HENT:  Mouth/Throat: No oropharyngeal exudate.  Cardiovascular: Normal rate and regular rhythm.  No murmur heard. Pulmonary/Chest: Effort normal. He has no wheezes. He has no rales.  Abdominal: Soft. He exhibits no distension. There is no tenderness.  Neurological: He is alert and oriented to person, place, and time.  Skin: No rash noted.  Psychiatric: Mood and affect normal.    Lab Results Lab Results  Component Value Date   WBC 9.9 05/22/2017   HGB 8.9 (L) 05/22/2017   HCT 27.5 (L) 05/22/2017   MCV 85.4 05/22/2017   PLT 391 05/22/2017    Lab Results  Component Value Date   CREATININE 0.70 05/22/2017   BUN 5 (L) 05/22/2017   NA 135 05/22/2017   K 3.2 (L) 05/22/2017   CL 101 05/22/2017   CO2 27 05/22/2017    Lab Results  Component Value Date   ALT 20 05/22/2017   AST 29 05/22/2017   ALKPHOS 84 05/22/2017   BILITOT 0.4 05/22/2017     Microbiology: Recent Results (from the past 240 hour(s))  Blood culture (routine x 2)     Status: None (Preliminary result)   Collection Time: 05/22/17  5:15 AM  Result Value Ref Range Status   Specimen Description BLOOD RIGHT FOREARM  Final   Special Requests   Final    BOTTLES DRAWN AEROBIC ONLY Blood Culture results may not be optimal due to an inadequate volume of blood received in culture bottles   Culture  Setup Time   Final    GRAM POSITIVE COCCI IN CLUSTERS AEROBIC BOTTLE ONLY CRITICAL RESULT CALLED TO, READ BACK BY AND VERIFIED WITH: BMANCHERIL,PHARMD @1259  05/23/17 BY LHOWARD    Culture GRAM POSITIVE COCCI  Final   Report Status PENDING  Incomplete  Blood Culture ID Panel (Reflexed)     Status: Abnormal   Collection Time: 05/22/17  5:15 AM  Result Value Ref Range Status   Enterococcus  species NOT DETECTED NOT DETECTED Final   Listeria monocytogenes NOT DETECTED NOT DETECTED Final   Staphylococcus species DETECTED (A) NOT DETECTED Final    Comment: CRITICAL RESULT CALLED TO, READ BACK BY AND VERIFIED WITH: BMANCHERIL,PHARMD @1259  05/23/17 BY LHOWARD    Staphylococcus aureus DETECTED (A) NOT DETECTED Final    Comment: Methicillin (oxacillin) susceptible Staphylococcus aureus (MSSA). Preferred therapy is anti staphylococcal beta lactam antibiotic (Cefazolin or Nafcillin), unless clinically contraindicated. CRITICAL RESULT CALLED TO, READ BACK BY AND VERIFIED WITH: BMANCHERIL,PHARMD @1259  05/23/17 BY LHOWARD    Methicillin resistance NOT DETECTED NOT DETECTED Final   Streptococcus species NOT DETECTED NOT DETECTED Final   Streptococcus agalactiae NOT DETECTED NOT DETECTED Final   Streptococcus pneumoniae NOT DETECTED NOT DETECTED Final   Streptococcus pyogenes NOT DETECTED NOT DETECTED Final   Acinetobacter baumannii  NOT DETECTED NOT DETECTED Final   Enterobacteriaceae species NOT DETECTED NOT DETECTED Final   Enterobacter cloacae complex NOT DETECTED NOT DETECTED Final   Escherichia coli NOT DETECTED NOT DETECTED Final   Klebsiella oxytoca NOT DETECTED NOT DETECTED Final   Klebsiella pneumoniae NOT DETECTED NOT DETECTED Final   Proteus species NOT DETECTED NOT DETECTED Final   Serratia marcescens NOT DETECTED NOT DETECTED Final   Haemophilus influenzae NOT DETECTED NOT DETECTED Final   Neisseria meningitidis NOT DETECTED NOT DETECTED Final   Pseudomonas aeruginosa NOT DETECTED NOT DETECTED Final   Candida albicans NOT DETECTED NOT DETECTED Final   Candida glabrata NOT DETECTED NOT DETECTED Final   Candida krusei NOT DETECTED NOT DETECTED Final   Candida parapsilosis NOT DETECTED NOT DETECTED Final   Candida tropicalis NOT DETECTED NOT DETECTED Final  Blood culture (routine x 2)     Status: None (Preliminary result)   Collection Time: 05/22/17  5:22 AM  Result Value  Ref Range Status   Specimen Description BLOOD RIGHT ANTECUBITAL  Final   Special Requests   Final    IN BOTH AEROBIC AND ANAEROBIC BOTTLES Blood Culture adequate volume   Culture NO GROWTH 1 DAY  Final   Report Status PENDING  Incomplete    Cliffton AstersJohn Jakaleb Payer, MD [facility] [phone] pager   [phone] cell 05/23/2017, 4:09 PM

## 2017-05-23 NOTE — Progress Notes (Signed)
PROGRESS NOTE  Koleen Distance ZOX:[id number] DOB: [birth year] DOA: 05/22/2017 PCP: Patient, No Pcp Per  HPI/Recap of past 24 hours: Robert Bellow is a 31 y.o. male with medical history significant for IVDA, recent hospitalization for right forearm deep abscess and strep pyogenes bacteremia, hepatitis C who presented to ED with reports of shortness of breath for past 3-4 days prior to the admission associated with chest pain especially when taking a deep breath. No cough. Was going to be discharged home from ED then started to complain of lower back pain and inability to walk. No reports of falls. MRI showed discitis/osteomyelitis of lumbar spine. Pt started on vanco and zosyn while awaiting culture results. Neurosurgery consulted by ED.  No acute events reported overnight. ID consulted for suspected hematogeneous osteomyelitis L2-3. Blood culture positive for MSSA x 1 bottle. IV vancomycin stopped. Continue IV zosyn day#1. May require 6-8 weeks of IV antibiotics. Patient is IV drug user. Last time he used IV heroine was about a week ago.  Pt seen and examined at his bedside. Reports back pain tender on palpation at lumbar region from L1 down to L4, 10/10 in severity sharp. No radiculopathy. Also reports tenderness on palpation of left side of chest worse when he coughs.    Assessment/Plan: Principal Problem:   Sepsis (HCC) Active Problems:   IVDU (intravenous drug user)   Normocytic anemia   Osteomyelitis (HCC)   Leukocytosis   Discitis of lumbar region   Hypokalemia  Sepsis (HCC) 2/2 suspected hematogeneous osteomyelitis/discitis (HCC)L2-3 - SIRS criteria wbc 13k, RR 26 - source of infection osteomyelitis as evidenced on MRI - Procalcitonin level 1.01 - CXR showed mild left basilar airspace opacity may reflect atelectasis or possibly mild pneumonia 05/22/17. - MRI lumbar spine showed findings consistent with discitis/osteomyelitis at L2-3. Right greater than left paravertebral  soft tissue inflammation with extensive right psoas inflammation/myositis. 20 x 6 mm right psoas abscess. Diffuse posterior paraspinal muscle inflammation in the lumbar spine suggestive of myositis. No evidence of infection in the cervical or thoracic spine. No epidural abscess. - Blood culture MSSA positive x1 - Started on admission broad spectrum antibiotics: IV vanco and IV zosyn  - IV vancomycin stopped today 05/23/17 - continue IV zosyn day#1 - May require IV antibiotics for 6-8 weeks - IV drug user with use 1 week ago-high risk for misuse picc line - neurosurgery, ID consulted we appreciate recommendations - Defer to ID for ordering bone biopsy if needed. Patient on IV antibiotics since admission 05/22/17    Pleuritic chest pain - CT angio chest did not show pulmonary embolism - troponin 0.00 (05/22/17)    IVDU (intravenous drug user) - UDS positive for opiates and amphetamines  - High risk for mis-use PICC line    Normocytic anemia - Hgb 8.9 from 9.2 -baseline hg 11.6    Hypokalemia  -K+ 3.2 - Supplemented - BMP in am     Code Status: full  Family Communication: states no family in the area.  Disposition Plan: will stay at least 3 midnights for IV antibiotics for treatment of osteomyelitis.   Consultants:  ID  pharmacy  Procedures:  None  Antimicrobials:  IV zosyn day#1  DVT prophylaxis:  lovenox 40 mg sq daily   Objective: Vitals:   05/22/17 1800 05/22/17 1837 05/22/17 2224 05/23/17 0551  BP: 130/88 121/70 116/73 122/75  Pulse: 86 93 87 95  Resp: (!) 21 20 16 16   Temp:  98.5 F (36.9 C) 99.3 F (37.4 C) 99.6  F (37.6 C)  TempSrc:  Oral Oral Oral  SpO2: 100% 100% 97% 98%  Weight:    72.8 kg (160 lb 6.4 oz)  Height:        Intake/Output Summary (Last 24 hours) at 05/23/2017 0826 Last data filed at 05/23/2017 0400 Gross per 24 hour  Intake 2762 ml  Output 1300 ml  Net 1462 ml   Filed Weights   05/22/17 0004 05/23/17 0551    Weight: 72.6 kg (160 lb) 72.8 kg (160 lb 6.4 oz)    Exam:   General:  31 yo M WD WN uncomfortable due to lower back and left chest pain  Cardiovascular: RRR no rubs or gallops  Respiratory: CTA no wheezes or rales   Abdomen: Soft tender on palpation on RUQ NBS x 4   Musculoskeletal: non focal No edema  Skin: multiple tatooes, scar on dorsum portion of right hand  Psychiatry: mood is appropriate for conditio and setting   Data Reviewed: CBC: Recent Labs  Lab 05/22/17 0031 05/22/17 1839  WBC 13.1* 9.9  NEUTROABS  --  7.1  HGB 9.2* 8.9*  HCT 28.4* 27.5*  MCV 85.8 85.4  PLT 380 391   Basic Metabolic Panel: Recent Labs  Lab 05/22/17 0031 05/22/17 1839  NA 132* 135  K 3.4* 3.2*  CL 99* 101  CO2 25 27  GLUCOSE 126* 141*  BUN 12 5*  CREATININE 0.67 0.70  CALCIUM 8.2* 8.4*  MG  --  1.7  PHOS  --  2.6   GFR: Estimated Creatinine Clearance: 137.8 mL/min (by C-G formula based on SCr of 0.7 mg/dL). Liver Function Tests: Recent Labs  Lab 05/22/17 0522 05/22/17 1839  AST 31 29  ALT 20 20  ALKPHOS 83 84  BILITOT 0.4 0.4  PROT 6.9 7.1  ALBUMIN 2.3* 2.4*   Recent Labs  Lab 05/22/17 0522  LIPASE 25   No results for input(s): AMMONIA in the last 168 hours. Coagulation Profile: No results for input(s): INR, PROTIME in the last 168 hours. Cardiac Enzymes: No results for input(s): CKTOTAL, CKMB, CKMBINDEX, TROPONINI in the last 168 hours. BNP (last 3 results) No results for input(s): PROBNP in the last 8760 hours. HbA1C: No results for input(s): HGBA1C in the last 72 hours. CBG: No results for input(s): GLUCAP in the last 168 hours. Lipid Profile: No results for input(s): CHOL, HDL, LDLCALC, TRIG, CHOLHDL, LDLDIRECT in the last 72 hours. Thyroid Function Tests: No results for input(s): TSH, T4TOTAL, FREET4, T3FREE, THYROIDAB in the last 72 hours. Anemia Panel: No results for input(s): VITAMINB12, FOLATE, FERRITIN, TIBC, IRON, RETICCTPCT in the last 72  hours. Urine analysis:    Component Value Date/Time   COLORURINE YELLOW 04/26/2017 1840   APPEARANCEUR CLEAR 04/26/2017 1840   LABSPEC 1.010 04/26/2017 1840   PHURINE 6.0 04/26/2017 1840   GLUCOSEU NEGATIVE 04/26/2017 1840   HGBUR NEGATIVE 04/26/2017 1840   BILIRUBINUR NEGATIVE 04/26/2017 1840   KETONESUR NEGATIVE 04/26/2017 1840   PROTEINUR NEGATIVE 04/26/2017 1840   NITRITE NEGATIVE 04/26/2017 1840   LEUKOCYTESUR NEGATIVE 04/26/2017 1840   Sepsis Labs: @LABRCNTIP (procalcitonin:4,lacticidven:4)  )No results found for this or any previous visit (from the past 240 hour(s)).    Studies: Ct Angio Chest Pe W And/or Wo Contrast  Addendum Date: 05/22/2017   ADDENDUM REPORT: 05/22/2017 10:47 ADDENDUM: Transcription error resulted in the deletion of the impression. IMPRESSION: Suboptimal pulmonary arterial opacification although no large central pulmonary embolus is noted. Mild bibasilar atelectatic changes with small left pleural effusion. Electronically  Signed   By: Alcide Clever M.D.   On: 05/22/2017 10:47   Result Date: 05/22/2017 CLINICAL DATA:  Shortness of breath and chest pain for 1 week, history of IV drug use and hepatitis-C. EXAM: CT ANGIOGRAPHY CHEST WITH CONTRAST TECHNIQUE: Multidetector CT imaging of the chest was performed using the standard protocol during bolus administration of intravenous contrast. Multiplanar CT image reconstructions and MIPs were obtained to evaluate the vascular anatomy. CONTRAST:  ISOVUE-370 COMPARISON:  Chest x-ray from earlier in the same day. FINDINGS: Cardiovascular: Thoracic aorta is within normal limits without atherosclerotic change. No significant cardiac enlargement is noted. No coronary calcifications are seen. The pulmonary artery opacification is somewhat less than optimal mall although no large central pulmonary emboli are seen. Mediastinum/Nodes: Thoracic inlet is within normal limits. Small likely reactive hilar lymph nodes are noted  bilaterally. No sizable mediastinal or hilar adenopathy is noted. The esophagus is within normal limits. Lungs/Pleura: Small left pleural effusion is identified. Some minimal bibasilar atelectatic changes are seen. Upper Abdomen: Upper abdomen demonstrates the liver to be mildly prominent. This is consistent with the given clinical history. Musculoskeletal: Mild degenerative changes of the thoracic spine are noted. No acute bony abnormality is seen. Review of the MIP images confirms the above findings. Electronically Signed: By: Alcide Clever M.D. On: 05/22/2017 08:43   Mr Cervical Spine W Wo Contrast  Result Date: 05/22/2017 CLINICAL DATA:  Back pain.  IV drug use. EXAM: MRI CERVICAL, THORACIC, AND LUMBAR SPINE WITHOUT AND WITH CONTRAST TECHNIQUE: Multiplanar and multiecho pulse sequences of the cervical spine, to include the craniocervical junction and cervicothoracic junction, and thoracic and lumbar spine were obtained without and with intravenous contrast. CONTRAST:  15mL MULTIHANCE GADOBENATE DIMEGLUMINE 529 MG/ML IV SOLN COMPARISON:  Chest CTA 05/22/2017 FINDINGS: There is mild-to-moderate motion artifact throughout the examinations. MRI CERVICAL SPINE FINDINGS Alignment: No listhesis. Vertebrae: Diffusely decreased bone marrow signal intensity likely related to patient's anemia. No fracture, suspicious focal osseous lesion, or evidence of discitis/osteomyelitis. No epidural collection. Cord: Normal signal and morphology. No abnormal intradural enhancement. Posterior Fossa, vertebral arteries, paraspinal tissues: Unremarkable. Disc levels: Minimal disc bulging from C3-4 to C5-6. No evidence of significant spinal or neural foraminal stenosis. MRI THORACIC SPINE FINDINGS Alignment:  Normal. Vertebrae: Diffusely decreased bone marrow signal intensity likely related to patient's anemia. Scattered Schmorl's nodes, most notably in the T11 and T12 superior endplates. Mild endplate edema and enhancement  asymmetric to the right at T6-7 with an associated right lateral T6 inferior endplate Schmorl's node and bulky right lateral spurring on CT, all felt to be degenerative and without adjacent paraspinal soft tissue inflammation. No evidence of discitis/osteomyelitis elsewhere in the thoracic spine. No fracture or suspicious osseous lesion. No epidural collection. Cord: Normal signal and morphology. No abnormal intradural enhancement. Paraspinal and other soft tissues: Small left pleural effusion and dependent atelectasis in both lungs as shown on the recent CTA. Disc levels: Mild disc degeneration with scattered mild disc bulging and endplate spurring throughout the upper and midthoracic spine. No significant spinal stenosis, neural foraminal stenosis, or spinal cord compression. MRI LUMBAR SPINE FINDINGS Segmentation:  Standard. Alignment:  Normal. Vertebrae: Diffusely decreased bone marrow signal intensity likely related the patient's anemia. Nonspecific 9 mm T2 hyperintense focus in the posterior L1 vertebral body. There is moderate edema and enhancement involving the L3 superior greater than L2 inferior endplates, asymmetric to the right. There are small Schmorl's node type deformities in the L3 superior endplate with a limbus vertebra configuration,  and there is a small amount of STIR hyperintense signal in the L2-3 disc space without disc enhancement. There is underlying disc space narrowing and disc desiccation at L2-3. There is no evidence of discitis/osteomyelitis elsewhere in the lumbar spine. No epidural fluid collection. Conus medullaris: Extends to the L1 level and appears normal. Paraspinal and other soft tissues: Right greater than left paravertebral soft tissue inflammation at L2-3 with prominent inflammation extending throughout much of the right psoas muscle. There is a 20 x 6 mm fluid collection in the right psoas muscle at L3. There is symmetric diffuse posterior paraspinal muscle edema  bilaterally, greatest in the lower lumbar spine without underlying fluid collection. Disc levels: Underlying disc degeneration at L2-3 with mild disc bulging but no stenosis. Mild disc desiccation and minimal bulging at L5-S1 without stenosis. Normal discs at L1-2, L3-4, and L4-5. IMPRESSION: 1. Findings consistent with discitis/osteomyelitis at L2-3. Right greater than left paravertebral soft tissue inflammation with extensive right psoas inflammation/myositis. 20 x 6 mm right psoas abscess. 2. Diffuse posterior paraspinal muscle inflammation in the lumbar spine suggestive of myositis. 3. No evidence of infection in the cervical or thoracic spine. No epidural abscess. Electronically Signed   By: Sebastian Ache M.D.   On: 05/22/2017 16:26   Mr Thoracic Spine W Wo Contrast  Result Date: 05/22/2017 CLINICAL DATA:  Back pain.  IV drug use. EXAM: MRI CERVICAL, THORACIC, AND LUMBAR SPINE WITHOUT AND WITH CONTRAST TECHNIQUE: Multiplanar and multiecho pulse sequences of the cervical spine, to include the craniocervical junction and cervicothoracic junction, and thoracic and lumbar spine were obtained without and with intravenous contrast. CONTRAST:  15mL MULTIHANCE GADOBENATE DIMEGLUMINE 529 MG/ML IV SOLN COMPARISON:  Chest CTA 05/22/2017 FINDINGS: There is mild-to-moderate motion artifact throughout the examinations. MRI CERVICAL SPINE FINDINGS Alignment: No listhesis. Vertebrae: Diffusely decreased bone marrow signal intensity likely related to patient's anemia. No fracture, suspicious focal osseous lesion, or evidence of discitis/osteomyelitis. No epidural collection. Cord: Normal signal and morphology. No abnormal intradural enhancement. Posterior Fossa, vertebral arteries, paraspinal tissues: Unremarkable. Disc levels: Minimal disc bulging from C3-4 to C5-6. No evidence of significant spinal or neural foraminal stenosis. MRI THORACIC SPINE FINDINGS Alignment:  Normal. Vertebrae: Diffusely decreased bone marrow  signal intensity likely related to patient's anemia. Scattered Schmorl's nodes, most notably in the T11 and T12 superior endplates. Mild endplate edema and enhancement asymmetric to the right at T6-7 with an associated right lateral T6 inferior endplate Schmorl's node and bulky right lateral spurring on CT, all felt to be degenerative and without adjacent paraspinal soft tissue inflammation. No evidence of discitis/osteomyelitis elsewhere in the thoracic spine. No fracture or suspicious osseous lesion. No epidural collection. Cord: Normal signal and morphology. No abnormal intradural enhancement. Paraspinal and other soft tissues: Small left pleural effusion and dependent atelectasis in both lungs as shown on the recent CTA. Disc levels: Mild disc degeneration with scattered mild disc bulging and endplate spurring throughout the upper and midthoracic spine. No significant spinal stenosis, neural foraminal stenosis, or spinal cord compression. MRI LUMBAR SPINE FINDINGS Segmentation:  Standard. Alignment:  Normal. Vertebrae: Diffusely decreased bone marrow signal intensity likely related the patient's anemia. Nonspecific 9 mm T2 hyperintense focus in the posterior L1 vertebral body. There is moderate edema and enhancement involving the L3 superior greater than L2 inferior endplates, asymmetric to the right. There are small Schmorl's node type deformities in the L3 superior endplate with a limbus vertebra configuration, and there is a small amount of STIR hyperintense signal in the  L2-3 disc space without disc enhancement. There is underlying disc space narrowing and disc desiccation at L2-3. There is no evidence of discitis/osteomyelitis elsewhere in the lumbar spine. No epidural fluid collection. Conus medullaris: Extends to the L1 level and appears normal. Paraspinal and other soft tissues: Right greater than left paravertebral soft tissue inflammation at L2-3 with prominent inflammation extending throughout much of  the right psoas muscle. There is a 20 x 6 mm fluid collection in the right psoas muscle at L3. There is symmetric diffuse posterior paraspinal muscle edema bilaterally, greatest in the lower lumbar spine without underlying fluid collection. Disc levels: Underlying disc degeneration at L2-3 with mild disc bulging but no stenosis. Mild disc desiccation and minimal bulging at L5-S1 without stenosis. Normal discs at L1-2, L3-4, and L4-5. IMPRESSION: 1. Findings consistent with discitis/osteomyelitis at L2-3. Right greater than left paravertebral soft tissue inflammation with extensive right psoas inflammation/myositis. 20 x 6 mm right psoas abscess. 2. Diffuse posterior paraspinal muscle inflammation in the lumbar spine suggestive of myositis. 3. No evidence of infection in the cervical or thoracic spine. No epidural abscess. Electronically Signed   By: Sebastian AcheAllen  Grady M.D.   On: 05/22/2017 16:26   Mr Lumbar Spine W Wo Contrast  Result Date: 05/22/2017 CLINICAL DATA:  Back pain.  IV drug use. EXAM: MRI CERVICAL, THORACIC, AND LUMBAR SPINE WITHOUT AND WITH CONTRAST TECHNIQUE: Multiplanar and multiecho pulse sequences of the cervical spine, to include the craniocervical junction and cervicothoracic junction, and thoracic and lumbar spine were obtained without and with intravenous contrast. CONTRAST:  15mL MULTIHANCE GADOBENATE DIMEGLUMINE 529 MG/ML IV SOLN COMPARISON:  Chest CTA 05/22/2017 FINDINGS: There is mild-to-moderate motion artifact throughout the examinations. MRI CERVICAL SPINE FINDINGS Alignment: No listhesis. Vertebrae: Diffusely decreased bone marrow signal intensity likely related to patient's anemia. No fracture, suspicious focal osseous lesion, or evidence of discitis/osteomyelitis. No epidural collection. Cord: Normal signal and morphology. No abnormal intradural enhancement. Posterior Fossa, vertebral arteries, paraspinal tissues: Unremarkable. Disc levels: Minimal disc bulging from C3-4 to C5-6. No  evidence of significant spinal or neural foraminal stenosis. MRI THORACIC SPINE FINDINGS Alignment:  Normal. Vertebrae: Diffusely decreased bone marrow signal intensity likely related to patient's anemia. Scattered Schmorl's nodes, most notably in the T11 and T12 superior endplates. Mild endplate edema and enhancement asymmetric to the right at T6-7 with an associated right lateral T6 inferior endplate Schmorl's node and bulky right lateral spurring on CT, all felt to be degenerative and without adjacent paraspinal soft tissue inflammation. No evidence of discitis/osteomyelitis elsewhere in the thoracic spine. No fracture or suspicious osseous lesion. No epidural collection. Cord: Normal signal and morphology. No abnormal intradural enhancement. Paraspinal and other soft tissues: Small left pleural effusion and dependent atelectasis in both lungs as shown on the recent CTA. Disc levels: Mild disc degeneration with scattered mild disc bulging and endplate spurring throughout the upper and midthoracic spine. No significant spinal stenosis, neural foraminal stenosis, or spinal cord compression. MRI LUMBAR SPINE FINDINGS Segmentation:  Standard. Alignment:  Normal. Vertebrae: Diffusely decreased bone marrow signal intensity likely related the patient's anemia. Nonspecific 9 mm T2 hyperintense focus in the posterior L1 vertebral body. There is moderate edema and enhancement involving the L3 superior greater than L2 inferior endplates, asymmetric to the right. There are small Schmorl's node type deformities in the L3 superior endplate with a limbus vertebra configuration, and there is a small amount of STIR hyperintense signal in the L2-3 disc space without disc enhancement. There is underlying disc space narrowing  and disc desiccation at L2-3. There is no evidence of discitis/osteomyelitis elsewhere in the lumbar spine. No epidural fluid collection. Conus medullaris: Extends to the L1 level and appears normal. Paraspinal  and other soft tissues: Right greater than left paravertebral soft tissue inflammation at L2-3 with prominent inflammation extending throughout much of the right psoas muscle. There is a 20 x 6 mm fluid collection in the right psoas muscle at L3. There is symmetric diffuse posterior paraspinal muscle edema bilaterally, greatest in the lower lumbar spine without underlying fluid collection. Disc levels: Underlying disc degeneration at L2-3 with mild disc bulging but no stenosis. Mild disc desiccation and minimal bulging at L5-S1 without stenosis. Normal discs at L1-2, L3-4, and L4-5. IMPRESSION: 1. Findings consistent with discitis/osteomyelitis at L2-3. Right greater than left paravertebral soft tissue inflammation with extensive right psoas inflammation/myositis. 20 x 6 mm right psoas abscess. 2. Diffuse posterior paraspinal muscle inflammation in the lumbar spine suggestive of myositis. 3. No evidence of infection in the cervical or thoracic spine. No epidural abscess. Electronically Signed   By: Sebastian AcheAllen  Grady M.D.   On: 05/22/2017 16:26    Scheduled Meds: . enoxaparin (LOVENOX) injection  30 mg Subcutaneous Q24H  . LORazepam  1 mg Intravenous Once  . sodium chloride flush  3 mL Intravenous Q12H    Continuous Infusions: . sodium chloride 75 mL/hr at 05/22/17 2041  . piperacillin-tazobactam (ZOSYN)  IV 3.375 g (05/23/17 0318)  . vancomycin Stopped (05/23/17 0420)     LOS: 1 day     Darlin Droparole N Hall, MD [facility] Pager [phone]  If 7PM-7AM, please contact night-coverage [email or web address] Password [email or web address] 05/23/2017, 8:26 AM

## 2017-05-24 ENCOUNTER — Inpatient Hospital Stay (HOSPITAL_COMMUNITY): Payer: Self-pay

## 2017-05-24 DIAGNOSIS — N644 Mastodynia: Secondary | ICD-10-CM

## 2017-05-24 DIAGNOSIS — R601 Generalized edema: Secondary | ICD-10-CM

## 2017-05-24 DIAGNOSIS — R079 Chest pain, unspecified: Secondary | ICD-10-CM

## 2017-05-24 DIAGNOSIS — I361 Nonrheumatic tricuspid (valve) insufficiency: Secondary | ICD-10-CM

## 2017-05-24 DIAGNOSIS — A419 Sepsis, unspecified organism: Secondary | ICD-10-CM

## 2017-05-24 DIAGNOSIS — J9811 Atelectasis: Secondary | ICD-10-CM

## 2017-05-24 DIAGNOSIS — L02213 Cutaneous abscess of chest wall: Secondary | ICD-10-CM

## 2017-05-24 DIAGNOSIS — F1990 Other psychoactive substance use, unspecified, uncomplicated: Secondary | ICD-10-CM

## 2017-05-24 LAB — BASIC METABOLIC PANEL
Anion gap: 7 (ref 5–15)
BUN: 6 mg/dL (ref 6–20)
CHLORIDE: 101 mmol/L (ref 101–111)
CO2: 26 mmol/L (ref 22–32)
CREATININE: 0.52 mg/dL — AB (ref 0.61–1.24)
Calcium: 8.6 mg/dL — ABNORMAL LOW (ref 8.9–10.3)
Glucose, Bld: 105 mg/dL — ABNORMAL HIGH (ref 65–99)
Potassium: 4 mmol/L (ref 3.5–5.1)
SODIUM: 134 mmol/L — AB (ref 135–145)

## 2017-05-24 LAB — CBC
HCT: 29.2 % — ABNORMAL LOW (ref 39.0–52.0)
HEMOGLOBIN: 9.2 g/dL — AB (ref 13.0–17.0)
MCH: 26.9 pg (ref 26.0–34.0)
MCHC: 31.5 g/dL (ref 30.0–36.0)
MCV: 85.4 fL (ref 78.0–100.0)
PLATELETS: 422 10*3/uL — AB (ref 150–400)
RBC: 3.42 MIL/uL — AB (ref 4.22–5.81)
RDW: 13.6 % (ref 11.5–15.5)
WBC: 11.3 10*3/uL — ABNORMAL HIGH (ref 4.0–10.5)

## 2017-05-24 LAB — ECHOCARDIOGRAM COMPLETE
Height: 73 in
WEIGHTICAEL: 2557.34 [oz_av]

## 2017-05-24 LAB — GLUCOSE, CAPILLARY: GLUCOSE-CAPILLARY: 122 mg/dL — AB (ref 65–99)

## 2017-05-24 NOTE — Progress Notes (Signed)
  Echocardiogram 2D Echocardiogram has been performed.  Hunter [name] 05/24/2017, 9:21 AM

## 2017-05-24 NOTE — Progress Notes (Addendum)
Physical Therapy Treatment Patient Details Name: Hunter MountsDavid Anthony [name] MRN: [id number] DOB: February 07, 1986 Today's Date: 05/24/2017    History of Present Illness Hunter RammingDavid [name] is a 31 y.o. male with medical history significant for IVDA, recent hospitalization for right forearm deep abscess and strep pyogenes bacteremia, hepatitis C who presented to ED with reports of shortness of breath for past 3-4 days prior to the admission associated with chest pain especially when taking a deep breath. MRI showed discitis/osteomyelitis of lumbar spine; Sepsis (HCC) / Osteomyelitis (HCC) / Discitis of lumbar region / Leukocytosis   PT Comments    Pt mobility greatly limited by pain however did agree to work with PT/OT. Pt did ambulate 2760' today with RW but is very dependent on RW. Pt was indep and living on the streets PTA.  Follow Up Recommendations   is drug rehab an option for him?  SNF- would be the best alternative to allow for healing and pain management to achieve safe indep function.     Equipment Recommendations  Rolling walker with 5" wheels;3in1 (PT)    Recommendations for Other Services       Precautions / Restrictions Precautions Precautions: Fall;Back Precaution Comments: Back precautions for comfort Restrictions Weight Bearing Restrictions: No    Mobility  Bed Mobility Overal bed mobility: Needs Assistance Bed Mobility: Rolling;Sidelying to Sit;Sit to Sidelying Rolling: Min guard Sidelying to sit: Min guard;HOB elevated     Sit to sidelying: Min guard General bed mobility comments: v/c's for log rolling to minimize pain, no physical assist needed  Transfers Overall transfer level: Needs assistance Equipment used: Rolling walker (2 wheeled) Transfers: Sit to/from Stand Sit to Stand: Min assist;From elevated surface         General transfer comment: v/c's for hand placement, bed elevated, increased time, min A to steady pt during transition of hands from bed to  walker  Ambulation/Gait Ambulation/Gait assistance: Min guard Ambulation Distance (Feet): 60 Feet Assistive device: Rolling walker (2 wheeled) Gait Pattern/deviations: Step-through pattern;Decreased stride length Gait velocity: slow Gait velocity interpretation: Below normal speed for age/gender General Gait Details: very dependent on UEs   Stairs            Wheelchair Mobility    Modified Rankin (Stroke Patients Only)       Balance Overall balance assessment: Needs assistance Sitting-balance support: Bilateral upper extremity supported Sitting balance-Leahy Scale: Poor Sitting balance - Comments: needs to brace self due to pain Postural control: Right lateral lean Standing balance support: Bilateral upper extremity supported;During functional activity Standing balance-Leahy Scale: Poor Standing balance comment: reliant on RW due to pain                            Cognition Arousal/Alertness: Awake/alert Behavior During Therapy: WFL for tasks assessed/performed Overall Cognitive Status: Within Functional Limits for tasks assessed                                        Exercises      General Comments        Pertinent Vitals/Pain Pain Assessment: 0-10 Pain Score: 9  Pain Location: Back pain Pain Descriptors / Indicators: Constant;Grimacing;Guarding;Discomfort Pain Intervention(s): Monitored during session    Home Living Family/patient expects to be discharged to:: Unsure(pt currently homeless)               Additional Comments: Would  D/C to an addiction rehab center be an option for Hunter [name]?    Prior Function Level of Independence: Independent          PT Goals (current goals can now be found in the care plan section) Acute Rehab PT Goals Patient Stated Goal: less pain Progress towards PT goals: Progressing toward goals    Frequency    Min 3X/week      PT Plan Current plan remains appropriate     Co-evaluation PT/OT/SLP Co-Evaluation/Treatment: Yes Reason for Co-Treatment: For patient/therapist safety(pt in extreme pain and would only get OOB x1) PT goals addressed during session: Mobility/safety with mobility OT goals addressed during session: ADL's and self-care;Strengthening/ROM      AM-PAC PT "6 Clicks" Daily Activity  Outcome Measure  Difficulty turning over in bed (including adjusting bedclothes, sheets and blankets)?: Unable Difficulty moving from lying on back to sitting on the side of the bed? : Unable Difficulty sitting down on and standing up from a chair with arms (e.g., wheelchair, bedside commode, etc,.)?: Unable Help needed moving to and from a bed to chair (including a wheelchair)?: A Little Help needed walking in hospital room?: A Little Help needed climbing 3-5 steps with a railing? : A Lot 6 Click Score: 11    End of Session Equipment Utilized During Treatment: Gait belt Activity Tolerance: Patient limited by pain Patient left: in bed;with call bell/phone within reach Nurse Communication: Mobility status;Other (comment) PT Visit Diagnosis: Unsteadiness on feet (R26.81);Other abnormalities of gait and mobility (R26.89);Pain Pain - part of body: (back)     Time: 1610-96041413-1431 PT Time Calculation (min) (ACUTE ONLY): 18 min  Charges:  $Gait Training: 8-22 mins                    G Codes:       Hunter [name], PT, DPT Pager #: [phone] Office #: [phone]    Hunter [name] 05/24/2017, 2:55 PM

## 2017-05-24 NOTE — Progress Notes (Signed)
PROGRESS NOTE  Hunter [name] ZOX:[id number] DOB: 07-20-1985 DOA: 05/22/2017 PCP: Patient, No Pcp Per  HPI/Recap of past 24 hours: Hunter [name] is a 31 y.o. male with medical history significant for IVDA, recent hospitalization for right forearm deep abscess and strep pyogenes bacteremia, hepatitis C who presented to ED with reports of shortness of breath for past 3-4 days prior to the admission associated with chest pain especially when taking a deep breath. No cough. Was going to be discharged home from ED then started to complain of lower back pain and inability to walk. No reports of falls. MRI showed discitis/osteomyelitis of lumbar spine. Pt started on vanco and zosyn while awaiting culture results. Neurosurgery consulted by ED.  No acute events reported overnight. ID following. On IV cefazolin day # 1 as recommended by ID. 2D echo done today 05/24/17 no evidence of a vegetation. Patient seen and examined at his bedside. Admits to back pain 9/10 sharp, non radiating with no radiculopathy while getting ready to have his pain med. No other complaints. Denies chest pain, dyspnea or palpitations.   Assessment/Plan: Principal Problem:   Bacteremia due to methicillin susceptible Staphylococcus aureus (MSSA) Active Problems:   IVDU (intravenous drug user)   Normocytic anemia   Chronic hepatitis C without hepatic coma (HCC)   Discitis of lumbar region   Hypokalemia   Psoas abscess, right (HCC)  Sepsis 2/2 suspected hematogeneous osteomyelitis/discitis L2-3 vs MSSA bacteremia, poa - SIRS criteria wbc 13k, RR 26. Procalcitonin level 1.01 - CXR showed mild left basilar airspace opacity may reflect atelectasis or possibly mild pneumonia 05/22/17. - MRI lumbar spine showed findings consistent with discitis/osteomyelitis at L2-3. Right greater than left paravertebral soft tissue inflammation with extensive right psoas inflammation/myositis. 20 x 6 mm right psoas abscess. Diffuse posterior  paraspinal muscle inflammation in the lumbar spine suggestive of myositis. No evidence of infection in the cervical or thoracic spine. No epidural abscess. - Blood culture MSSA positive x1 - repeated blood cx x 2 (05/23/17) no growth in less than 24 hours  - Started on admission broad spectrum antibiotics IV vanco and IV zosyn  - IV vancomycin and IV zosyn stopped 05/23/17 - May require IV antibiotics for 6-8 weeks for osteomyelitis - IV drug user with last use 1 week ago-high risk for misuse of picc line - neurosurgery, ID consulted we appreciate recommendations - Defer to ID for ordering bone biopsy if needed. Patient on IV antibiotics since admission 05/22/17. -on cefazolin day #1 for MSSA bacteremia/osteomyelitis   MSSA bacteremia, poa -IV cefazolin day # 1 -ID following -echo 05/24/17 showed no sign of vegetation -may require SNF placement for long term antibiotics -consulted social worker for placement    Pleuritic chest pain - CT angio chest did not show pulmonary embolism - troponin 0.00 (05/22/17)    IVDU (intravenous drug user) - UDS positive for opiates and amphetamines  - High risk for mis-use PICC line -May require SNF placement    Normocytic anemia - Hgb 9.2 from 8.9 from 9.2 -baseline hg 11.6 -No sign of overt bleeding    Hypokalemia, resolved  -K+4.0 from 3.2 - Supplemented - BMP in am   Code Status: full  Family Communication: states no family in the area.  Disposition Plan: will stay at least 2 midnights for IV antibiotics to treat MSSA bacteremia and osteomyelitis.   Consultants:  ID  pharmacy  Procedures:  None  Antimicrobials:  IV cefazolin day #1  DVT prophylaxis:  lovenox 40 mg sq daily  Objective: Vitals:   05/23/17 0551 05/23/17 1417 05/23/17 2228 05/24/17 0615  BP: 122/75 125/75 130/80 130/82  Pulse: 95 86 89 92  Resp: 16 18 18 17   Temp: 99.6 F (37.6 C) 99 F (37.2 C) 99.4 F (37.4 C) 98.6 F (37 C)  TempSrc:  Oral Oral Oral Oral  SpO2: 98% 97% 98% 100%  Weight: 72.8 kg (160 lb 6.4 oz)   72.5 kg (159 lb 13.3 oz)  Height:        Intake/Output Summary (Last 24 hours) at 05/24/2017 0757 Last data filed at 05/24/2017 0300 Gross per 24 hour  Intake 2185 ml  Output 1725 ml  Net 460 ml   Filed Weights   05/22/17 0004 05/23/17 0551 05/24/17 0615  Weight: 72.6 kg (160 lb) 72.8 kg (160 lb 6.4 oz) 72.5 kg (159 lb 13.3 oz)    Exam:   General:  31 yo M WD WN. Appears uncomfortable due to lower back and left chest pain  Cardiovascular: RRR no rubs or gallops  Respiratory: CTA no wheezes or rales   Abdomen: Soft tender on palpation on RUQ NBS x 4   Musculoskeletal: non focal No edema  Skin: multiple tatooes, scar on dorsum portion of right hand  Psychiatry: mood is appropriate for conditio and setting   Data Reviewed: CBC: Recent Labs  Lab 05/22/17 0031 05/22/17 1839 05/24/17 0246  WBC 13.1* 9.9 11.3*  NEUTROABS  --  7.1  --   HGB 9.2* 8.9* 9.2*  HCT 28.4* 27.5* 29.2*  MCV 85.8 85.4 85.4  PLT 380 391 422*   Basic Metabolic Panel: Recent Labs  Lab 05/22/17 0031 05/22/17 1839 05/24/17 0246  NA 132* 135 134*  K 3.4* 3.2* 4.0  CL 99* 101 101  CO2 25 27 26   GLUCOSE 126* 141* 105*  BUN 12 5* 6  CREATININE 0.67 0.70 0.52*  CALCIUM 8.2* 8.4* 8.6*  MG  --  1.7  --   PHOS  --  2.6  --    GFR: Estimated Creatinine Clearance: 137.2 mL/min (A) (by C-G formula based on SCr of 0.52 mg/dL (L)). Liver Function Tests: Recent Labs  Lab 05/22/17 0522 05/22/17 1839  AST 31 29  ALT 20 20  ALKPHOS 83 84  BILITOT 0.4 0.4  PROT 6.9 7.1  ALBUMIN 2.3* 2.4*   Recent Labs  Lab 05/22/17 0522  LIPASE 25   No results for input(s): AMMONIA in the last 168 hours. Coagulation Profile: No results for input(s): INR, PROTIME in the last 168 hours. Cardiac Enzymes: No results for input(s): CKTOTAL, CKMB, CKMBINDEX, TROPONINI in the last 168 hours. BNP (last 3 results) No results for  input(s): PROBNP in the last 8760 hours. HbA1C: No results for input(s): HGBA1C in the last 72 hours. CBG: No results for input(s): GLUCAP in the last 168 hours. Lipid Profile: No results for input(s): CHOL, HDL, LDLCALC, TRIG, CHOLHDL, LDLDIRECT in the last 72 hours. Thyroid Function Tests: No results for input(s): TSH, T4TOTAL, FREET4, T3FREE, THYROIDAB in the last 72 hours. Anemia Panel: No results for input(s): VITAMINB12, FOLATE, FERRITIN, TIBC, IRON, RETICCTPCT in the last 72 hours. Urine analysis:    Component Value Date/Time   COLORURINE YELLOW 04/26/2017 1840   APPEARANCEUR CLEAR 04/26/2017 1840   LABSPEC 1.010 04/26/2017 1840   PHURINE 6.0 04/26/2017 1840   GLUCOSEU NEGATIVE 04/26/2017 1840   HGBUR NEGATIVE 04/26/2017 1840   BILIRUBINUR NEGATIVE 04/26/2017 1840   KETONESUR NEGATIVE 04/26/2017 1840   PROTEINUR NEGATIVE 04/26/2017 1840  NITRITE NEGATIVE 04/26/2017 1840   LEUKOCYTESUR NEGATIVE 04/26/2017 1840   Sepsis Labs: @LABRCNTIP (procalcitonin:4,lacticidven:4)  ) Recent Results (from the past 240 hour(s))  Blood culture (routine x 2)     Status: None (Preliminary result)   Collection Time: 05/22/17  5:15 AM  Result Value Ref Range Status   Specimen Description BLOOD RIGHT FOREARM  Final   Special Requests   Final    BOTTLES DRAWN AEROBIC ONLY Blood Culture results may not be optimal due to an inadequate volume of blood received in culture bottles   Culture  Setup Time   Final    GRAM POSITIVE COCCI IN CLUSTERS AEROBIC BOTTLE ONLY CRITICAL RESULT CALLED TO, READ BACK BY AND VERIFIED WITH: BMANCHERIL,PHARMD @1259  05/23/17 BY LHOWARD    Culture GRAM POSITIVE COCCI  Final   Report Status PENDING  Incomplete  Blood Culture ID Panel (Reflexed)     Status: Abnormal   Collection Time: 05/22/17  5:15 AM  Result Value Ref Range Status   Enterococcus species NOT DETECTED NOT DETECTED Final   Listeria monocytogenes NOT DETECTED NOT DETECTED Final   Staphylococcus  species DETECTED (A) NOT DETECTED Final    Comment: CRITICAL RESULT CALLED TO, READ BACK BY AND VERIFIED WITH: BMANCHERIL,PHARMD @1259  05/23/17 BY LHOWARD    Staphylococcus aureus DETECTED (A) NOT DETECTED Final    Comment: Methicillin (oxacillin) susceptible Staphylococcus aureus (MSSA). Preferred therapy is anti staphylococcal beta lactam antibiotic (Cefazolin or Nafcillin), unless clinically contraindicated. CRITICAL RESULT CALLED TO, READ BACK BY AND VERIFIED WITH: BMANCHERIL,PHARMD @1259  05/23/17 BY LHOWARD    Methicillin resistance NOT DETECTED NOT DETECTED Final   Streptococcus species NOT DETECTED NOT DETECTED Final   Streptococcus agalactiae NOT DETECTED NOT DETECTED Final   Streptococcus pneumoniae NOT DETECTED NOT DETECTED Final   Streptococcus pyogenes NOT DETECTED NOT DETECTED Final   Acinetobacter baumannii NOT DETECTED NOT DETECTED Final   Enterobacteriaceae species NOT DETECTED NOT DETECTED Final   Enterobacter cloacae complex NOT DETECTED NOT DETECTED Final   Escherichia coli NOT DETECTED NOT DETECTED Final   Klebsiella oxytoca NOT DETECTED NOT DETECTED Final   Klebsiella pneumoniae NOT DETECTED NOT DETECTED Final   Proteus species NOT DETECTED NOT DETECTED Final   Serratia marcescens NOT DETECTED NOT DETECTED Final   Haemophilus influenzae NOT DETECTED NOT DETECTED Final   Neisseria meningitidis NOT DETECTED NOT DETECTED Final   Pseudomonas aeruginosa NOT DETECTED NOT DETECTED Final   Candida albicans NOT DETECTED NOT DETECTED Final   Candida glabrata NOT DETECTED NOT DETECTED Final   Candida krusei NOT DETECTED NOT DETECTED Final   Candida parapsilosis NOT DETECTED NOT DETECTED Final   Candida tropicalis NOT DETECTED NOT DETECTED Final  Blood culture (routine x 2)     Status: None (Preliminary result)   Collection Time: 05/22/17  5:22 AM  Result Value Ref Range Status   Specimen Description BLOOD RIGHT ANTECUBITAL  Final   Special Requests   Final    IN BOTH  AEROBIC AND ANAEROBIC BOTTLES Blood Culture adequate volume   Culture NO GROWTH 1 DAY  Final   Report Status PENDING  Incomplete      Studies: No results found.  Scheduled Meds: . enoxaparin (LOVENOX) injection  40 mg Subcutaneous Q24H  . sodium chloride flush  3 mL Intravenous Q12H    Continuous Infusions: . sodium chloride 75 mL/hr at 05/22/17 2041  .  ceFAZolin (ANCEF) IV 2 g (05/24/17 0644)     LOS: 2 days     Darlin Drop, MD  [facility] Pager [phone]  If 7PM-7AM, please contact night-coverage [email or web address] Password [email or web address] 05/24/2017, 7:57 AM

## 2017-05-24 NOTE — Progress Notes (Signed)
Pt had 4 beat run of V-tach. Pt was asymptomatic and vital signs stable following run of V-tach. Kirby,NP notified. No new orders placed. Will continue to monitor and treat per MD orders.

## 2017-05-24 NOTE — Evaluation (Signed)
Occupational Therapy Evaluation Patient Details Name: Hunter [name] MRN: [id number] DOB: [birth year] Today's Date: 05/24/2017    History of Present Illness Hunter [name] is a 31 y.o. male with medical history significant for IVDA, recent hospitalization for right forearm deep abscess and strep pyogenes bacteremia, hepatitis C who presented to ED with reports of shortness of breath for past 3-4 days prior to the admission associated with chest pain especially when taking a deep breath. MRI showed discitis/osteomyelitis of lumbar spine; Sepsis (HCC) / Osteomyelitis (HCC) / Discitis of lumbar region / Leukocytosis   Clinical Impression   [name] admitted with above presents to acute OT with increased pain at rest and movement, decreased balance due to pain thus decreased independence with basic ADLs. He will benefit from acute OT with follow up TBD depending on social situation (pt currently homeless)    Follow Up Recommendations  Other (comment)(dependent on better pain control)    Equipment Recommendations  Other (comment)(TBD depending on D/C plan)       Precautions / Restrictions Precautions Precautions: Fall;Back Precaution Comments: Back precautions for comfort Restrictions Weight Bearing Restrictions: No      Mobility Bed Mobility Overal bed mobility: Needs Assistance Bed Mobility: Rolling;Sidelying to Sit;Sit to Sidelying Rolling: Min guard Sidelying to sit: Min guard;HOB elevated(use of rail)     Sit to sidelying: Min guard(use of rail) General bed mobility comments: Cues for log roll technique and up to sit  Transfers Overall transfer level: Needs assistance Equipment used: Rolling walker (2 wheeled) Transfers: Sit to/from Stand Sit to Stand: Min assist;From elevated surface         General transfer comment: cues for hand placement    Balance Overall balance assessment: Needs assistance Sitting-balance support: No upper extremity  supported;Bilateral upper extremity supported Sitting balance-Leahy Scale: Zero Sitting balance - Comments: Due to pain pt using Bil UE support at EOB Postural control: Right lateral lean Standing balance support: Bilateral upper extremity supported;During functional activity Standing balance-Leahy Scale: Poor Standing balance comment: reliant on RW due to pain                           ADL either performed or assessed with clinical judgement   ADL Overall ADL's : Needs assistance/impaired Eating/Feeding: Independent;Bed level   Grooming: Set up;Bed level   Upper Body Bathing: Set up;Bed level   Lower Body Bathing: Moderate assistance;Bed level   Upper Body Dressing : Set up;Bed level   Lower Body Dressing: Maximal assistance;Bed level   Toilet Transfer: Min guard;Ambulation;RW   Toileting- Clothing Manipulation and Hygiene: Minimal assistance;Sit to/from stand         General ADL Comments: Currently pt has a very hard time not using his Bil UEs for support due to pain which limits the position he can be in for basic ADLs     Vision Patient Visual Report: No change from baseline              Pertinent Vitals/Pain Pain Assessment: 0-10 Pain Score: 9  Pain Location: Back pain Pain Descriptors / Indicators: Constant;Grimacing;Guarding;Discomfort Pain Intervention(s): Monitored during session;Repositioned;Premedicated before session;Limited activity within patient's tolerance     Hand Dominance Right   Extremity/Trunk Assessment Upper Extremity Assessment Upper Extremity Assessment: Overall WFL for tasks assessed           Communication Communication Communication: No difficulties   Cognition Arousal/Alertness: Awake/alert Behavior During Therapy: WFL for tasks assessed/performed Overall Cognitive Status: Within Functional  Limits for tasks assessed                                                Home Living Family/patient  expects to be discharged to:: Unsure(pt currently homeless)                                 Additional Comments: Would D/C to an addiction rehab center be an option for Hunter [name]?      Prior Functioning/Environment Level of Independence: Independent                 OT Problem List: Decreased range of motion;Pain;Impaired balance (sitting and/or standing)      OT Treatment/Interventions: Self-care/ADL training;Balance training;DME and/or AE instruction;Patient/family education    OT Goals(Current goals can be found in the care plan section) Acute Rehab OT Goals Patient Stated Goal: less pain OT Goal Formulation: With patient Time For Goal Achievement: 06/07/17 Potential to Achieve Goals: Good  OT Frequency: Min 3X/week   Barriers to D/C: Decreased caregiver support;Inaccessible home environment          Co-evaluation PT/OT/SLP Co-Evaluation/Treatment: Yes Reason for Co-Treatment: For patient/therapist safety;To address functional/ADL transfers;Other (comment)(due to pt's pain)   OT goals addressed during session: ADL's and self-care;Strengthening/ROM      AM-PAC PT "6 Clicks" Daily Activity     Outcome Measure Help from another person eating meals?: None Help from another person taking care of personal grooming?: A Little Help from another person toileting, which includes using toliet, bedpan, or urinal?: A Little Help from another person bathing (including washing, rinsing, drying)?: A Lot Help from another person to put on and taking off regular upper body clothing?: A Little Help from another person to put on and taking off regular lower body clothing?: A Lot 6 Click Score: 17   End of Session Equipment Utilized During Treatment: Rolling walker  Activity Tolerance: Patient limited by pain Patient left: in bed;with bed alarm set;with call bell/phone within reach  OT Visit Diagnosis: Unsteadiness on feet (R26.81);Other abnormalities of gait and mobility  (R26.89);Pain Pain - part of body: (back)                Time: 1610-96041412-1431 OT Time Calculation (min): 19 min Charges:  OT General Charges $OT Visit: 1 Visit OT Evaluation $OT Eval Moderate Complexity: [address] Mod Cathy Christeen [place], [place] [phone] 05/24/2017

## 2017-05-24 NOTE — Progress Notes (Addendum)
Subjective: He is complaining of pain in his left chest as well as his lower back   Antibiotics:  Anti-infectives (From admission, onward)   Start     Dose/Rate Route Frequency Ordered Stop   05/23/17 1700  ceFAZolin (ANCEF) IVPB 2g/100 mL premix     2 g 200 mL/hr over 30 Minutes Intravenous Every 8 hours 05/23/17 1628     05/23/17 0400  vancomycin (VANCOCIN) IVPB 1000 mg/200 mL premix  Status:  Discontinued     1,000 mg 200 mL/hr over 60 Minutes Intravenous Every 8 hours 05/22/17 1733 05/23/17 1455   05/22/17 2000  piperacillin-tazobactam (ZOSYN) IVPB 3.375 g  Status:  Discontinued     3.375 g 12.5 mL/hr over 240 Minutes Intravenous Every 8 hours 05/22/17 1733 05/23/17 1628   05/22/17 1745  vancomycin (VANCOCIN) 500 mg in sodium chloride 0.9 % 100 mL IVPB     500 mg 100 mL/hr over 60 Minutes Intravenous  Once 05/22/17 1733 05/22/17 2042   05/22/17 1115  vancomycin (VANCOCIN) IVPB 1000 mg/200 mL premix     1,000 mg 200 mL/hr over 60 Minutes Intravenous  Once 05/22/17 1100 05/22/17 1220   05/22/17 1115  piperacillin-tazobactam (ZOSYN) IVPB 3.375 g     3.375 g 100 mL/hr over 30 Minutes Intravenous  Once 05/22/17 1100 05/22/17 1744   05/22/17 0000  azithromycin (ZITHROMAX) 250 MG tablet     250 mg Oral Daily 05/22/17 1051        Medications: Scheduled Meds: . enoxaparin (LOVENOX) injection  40 mg Subcutaneous Q24H  . sodium chloride flush  3 mL Intravenous Q12H   Continuous Infusions: . sodium chloride 75 mL/hr at 05/22/17 2041  .  ceFAZolin (ANCEF) IV Stopped (05/24/17 1459)   PRN Meds:.acetaminophen, HYDROmorphone (DILAUDID) injection, ondansetron **OR** ondansetron (ZOFRAN) IV    Objective: Weight change: -9.1 oz (-0.257 kg)  Intake/Output Summary (Last 24 hours) at 05/24/2017 1730 Last data filed at 05/24/2017 1613 Gross per 24 hour  Intake 1533.75 ml  Output 1625 ml  Net -91.25 ml   Blood pressure 131/80, pulse 79, temperature 98.3 F (36.8 C),  temperature source Oral, resp. rate 18, height 6\' 1"  (1.854 m), weight 159 lb 13.3 oz (72.5 kg), SpO2 100 %. Temp:  [98.3 F (36.8 C)-99.4 F (37.4 C)] 98.3 F (36.8 C) (12/17 1443) Pulse Rate:  [79-92] 79 (12/17 1443) Resp:  [17-18] 18 (12/17 1443) BP: (130-131)/(80-82) 131/80 (12/17 1443) SpO2:  [98 %-100 %] 100 % (12/17 1443) Weight:  [159 lb 13.3 oz (72.5 kg)] 159 lb 13.3 oz (72.5 kg) (12/17 0615)  Physical Exam: General: Alert and awake, oriented x3, HEENT: anicteric sclera, pupils reactive to light and accommodation, EOMI CVS regular rate, normal r,  no murmur rubs or gallops Chest: clear to auscultation bilaterally, no wheezing, rales or rhonchi Abdomen: soft nontender, nondistended, normal bowel sounds, Extremities: no  clubbing or edema noted bilaterally Skin: no rashes, scar right hand, tenderness near left nipple and chest with possible abscess here  Neuro: nonfocal  CBC:  CBC Latest Ref Rng & Units 05/24/2017 05/22/2017 05/22/2017  WBC 4.0 - 10.5 K/uL 11.3(H) 9.9 13.1(H)  Hemoglobin 13.0 - 17.0 g/dL 1.6(X) 8.9(L) 9.2(L)  Hematocrit 39.0 - 52.0 % 29.2(L) 27.5(L) 28.4(L)  Platelets 150 - 400 K/uL 422(H) 391 380      BMET Recent Labs    05/22/17 1839 05/24/17 0246  NA 135 134*  K 3.2* 4.0  CL 101 101  CO2 27  26  GLUCOSE 141* 105*  BUN 5* 6  CREATININE 0.70 0.52*  CALCIUM 8.4* 8.6*     Liver Panel  Recent Labs    05/22/17 0522 05/22/17 1839  PROT 6.9 7.1  ALBUMIN 2.3* 2.4*  AST 31 29  ALT 20 20  ALKPHOS 83 84  BILITOT 0.4 0.4  BILIDIR <0.1*  --   IBILI NOT CALCULATED  --        Sedimentation Rate No results for input(s): ESRSEDRATE in the last 72 hours. C-Reactive Protein No results for input(s): CRP in the last 72 hours.  Micro Results: Recent Results (from the past 720 hour(s))  Blood culture (routine x 2)     Status: Abnormal   Collection Time: 04/26/17  4:15 PM  Result Value Ref Range Status   Specimen Description BLOOD BLOOD  LEFT ARM  Final   Special Requests   Final    BOTTLES DRAWN AEROBIC AND ANAEROBIC Blood Culture results may not be optimal due to an excessive volume of blood received in culture bottles   Culture  Setup Time   Final    GRAM POSITIVE COCCI IN CHAINS IN BOTH AEROBIC AND ANAEROBIC BOTTLES CRITICAL RESULT CALLED TO, READ BACK BY AND VERIFIED WITH: MTURNER,PHARMD @0851  04/27/17 BY LHOWARD    Culture (A)  Final    GROUP A STREP (S.PYOGENES) ISOLATED HEALTH DEPARTMENT NOTIFIED Performed at [facility], [address] [place], [place] [address]    Report Status 04/29/2017 FINAL  Final   Organism ID, Bacteria GROUP A STREP (S.PYOGENES) ISOLATED  Final      Susceptibility   Group a strep (s.pyogenes) isolated - MIC*    PENICILLIN <=0.06 SENSITIVE Sensitive     CEFTRIAXONE <=0.12 SENSITIVE Sensitive     ERYTHROMYCIN <=0.12 SENSITIVE Sensitive     LEVOFLOXACIN 0.5 SENSITIVE Sensitive     VANCOMYCIN 0.5 SENSITIVE Sensitive     * GROUP A STREP (S.PYOGENES) ISOLATED  Blood Culture ID Panel (Reflexed)     Status: Abnormal   Collection Time: 04/26/17  4:15 PM  Result Value Ref Range Status   Enterococcus species NOT DETECTED NOT DETECTED Final   Listeria monocytogenes NOT DETECTED NOT DETECTED Final   Staphylococcus species NOT DETECTED NOT DETECTED Final   Staphylococcus aureus NOT DETECTED NOT DETECTED Final   Streptococcus species DETECTED (A) NOT DETECTED Final    Comment: CRITICAL RESULT CALLED TO, READ BACK BY AND VERIFIED WITH: MTURNER,PHARMD @0951  04/27/17 BY LHOWARD    Streptococcus agalactiae NOT DETECTED NOT DETECTED Final   Streptococcus pneumoniae NOT DETECTED NOT DETECTED Final   Streptococcus pyogenes DETECTED (A) NOT DETECTED Final    Comment: CRITICAL RESULT CALLED TO, READ BACK BY AND VERIFIED WITH: MTURNER,PHARMD BY LHOWARD @0951  04/27/17    Acinetobacter baumannii NOT DETECTED NOT DETECTED Final   Enterobacteriaceae species NOT DETECTED NOT DETECTED Final    Enterobacter cloacae complex NOT DETECTED NOT DETECTED Final   Escherichia coli NOT DETECTED NOT DETECTED Final   Klebsiella oxytoca NOT DETECTED NOT DETECTED Final   Klebsiella pneumoniae NOT DETECTED NOT DETECTED Final   Proteus species NOT DETECTED NOT DETECTED Final   Serratia marcescens NOT DETECTED NOT DETECTED Final   Haemophilus influenzae NOT DETECTED NOT DETECTED Final   Neisseria meningitidis NOT DETECTED NOT DETECTED Final   Pseudomonas aeruginosa NOT DETECTED NOT DETECTED Final   Candida albicans NOT DETECTED NOT DETECTED Final   Candida glabrata NOT DETECTED NOT DETECTED Final   Candida krusei NOT DETECTED NOT DETECTED Final  Candida parapsilosis NOT DETECTED NOT DETECTED Final   Candida tropicalis NOT DETECTED NOT DETECTED Final    Comment: Performed at [facility], [address] [place], [place] [address]  Blood culture (routine x 2)     Status: Abnormal   Collection Time: 04/26/17  4:20 PM  Result Value Ref Range Status   Specimen Description BLOOD BLOOD LEFT HAND  Final   Special Requests   Final    BOTTLES DRAWN AEROBIC AND ANAEROBIC Blood Culture adequate volume   Culture  Setup Time   Final    GRAM POSITIVE COCCI IN CHAINS IN BOTH AEROBIC AND ANAEROBIC BOTTLES CRITICAL VALUE NOTED.  VALUE IS CONSISTENT WITH PREVIOUSLY REPORTED AND CALLED VALUE.    Culture (A)  Final    GROUP A STREP (S.PYOGENES) ISOLATED SUSCEPTIBILITIES PERFORMED ON PREVIOUS CULTURE WITHIN THE LAST 5 DAYS. HEALTH DEPARTMENT NOTIFIED Performed at [facility], [address] [place], [place] [address]    Report Status 04/29/2017 FINAL  Final  Wound or Superficial Culture     Status: None   Collection Time: 04/26/17  4:30 PM  Result Value Ref Range Status   Specimen Description WOUND RIGHT HAND  Final   Special Requests Normal  Final   Gram Stain   Final    NO WBC SEEN FEW GRAM POSITIVE COCCI Performed at [facility], [address] [place], [place] [address]     Culture   Final    MODERATE GROUP A STREP (S.PYOGENES) ISOLATED MODERATE STAPHYLOCOCCUS AUREUS    Report Status 04/29/2017 FINAL  Final   Organism ID, Bacteria STAPHYLOCOCCUS AUREUS  Final      Susceptibility   Staphylococcus aureus - MIC*    CIPROFLOXACIN <=0.5 SENSITIVE Sensitive     ERYTHROMYCIN <=0.25 SENSITIVE Sensitive     GENTAMICIN <=0.5 SENSITIVE Sensitive     OXACILLIN 0.5 SENSITIVE Sensitive     TETRACYCLINE <=1 SENSITIVE Sensitive     VANCOMYCIN 1 SENSITIVE Sensitive     TRIMETH/SULFA <=10 SENSITIVE Sensitive     CLINDAMYCIN <=0.25 SENSITIVE Sensitive     RIFAMPIN <=0.5 SENSITIVE Sensitive     Inducible Clindamycin NEGATIVE Sensitive     * MODERATE STAPHYLOCOCCUS AUREUS  Surgical pcr screen     Status: Abnormal   Collection Time: 04/26/17  9:52 PM  Result Value Ref Range Status   MRSA, PCR NEGATIVE NEGATIVE Final   Staphylococcus aureus POSITIVE (A) NEGATIVE Final    Comment: (NOTE) The Xpert SA Assay (FDA approved for NASAL specimens in patients 84 years of age and older), is one component of a comprehensive surveillance program. It is not intended to diagnose infection nor to guide or monitor treatment.   Aerobic/Anaerobic Culture (surgical/deep wound)     Status: None   Collection Time: 04/26/17 11:17 PM  Result Value Ref Range Status   Specimen Description ABSCESS RIGHT HAND  Final   Special Requests PATIENT ON FOLLOWING ZOSYN AND VANC  Final   Gram Stain   Final    ABUNDANT WBC PRESENT, PREDOMINANTLY PMN RARE GRAM POSITIVE COCCI    Culture   Final    RARE GROUP A STREP (S.PYOGENES) ISOLATED NO ANAEROBES ISOLATED    Report Status 05/02/2017 FINAL  Final  Culture, blood (Routine X 2) w Reflex to ID Panel     Status: None   Collection Time: 04/29/17  2:29 PM  Result Value Ref Range Status   Specimen Description BLOOD RIGHT ANTECUBITAL  Final   Special  Requests IN PEDIATRIC BOTTLE Blood Culture adequate volume  Final   Culture NO GROWTH 5 DAYS  Final    Report Status 05/04/2017 FINAL  Final  Culture, blood (Routine X 2) w Reflex to ID Panel     Status: None   Collection Time: 04/29/17  2:40 PM  Result Value Ref Range Status   Specimen Description BLOOD LEFT FOREARM  Final   Special Requests IN PEDIATRIC BOTTLE Blood Culture adequate volume  Final   Culture NO GROWTH 5 DAYS  Final   Report Status 05/04/2017 FINAL  Final  Blood culture (routine x 2)     Status: Abnormal (Preliminary result)   Collection Time: 05/22/17  5:15 AM  Result Value Ref Range Status   Specimen Description BLOOD RIGHT FOREARM  Final   Special Requests   Final    BOTTLES DRAWN AEROBIC ONLY Blood Culture results may not be optimal due to an inadequate volume of blood received in culture bottles   Culture  Setup Time   Final    GRAM POSITIVE COCCI IN CLUSTERS AEROBIC BOTTLE ONLY CRITICAL RESULT CALLED TO, READ BACK BY AND VERIFIED WITH: BMANCHERIL,PHARMD @1259  05/23/17 BY LHOWARD    Culture (A)  Final    STAPHYLOCOCCUS AUREUS SUSCEPTIBILITIES TO FOLLOW    Report Status PENDING  Incomplete  Blood Culture ID Panel (Reflexed)     Status: Abnormal   Collection Time: 05/22/17  5:15 AM  Result Value Ref Range Status   Enterococcus species NOT DETECTED NOT DETECTED Final   Listeria monocytogenes NOT DETECTED NOT DETECTED Final   Staphylococcus species DETECTED (A) NOT DETECTED Final    Comment: CRITICAL RESULT CALLED TO, READ BACK BY AND VERIFIED WITH: BMANCHERIL,PHARMD @1259  05/23/17 BY LHOWARD    Staphylococcus aureus DETECTED (A) NOT DETECTED Final    Comment: Methicillin (oxacillin) susceptible Staphylococcus aureus (MSSA). Preferred therapy is anti staphylococcal beta lactam antibiotic (Cefazolin or Nafcillin), unless clinically contraindicated. CRITICAL RESULT CALLED TO, READ BACK BY AND VERIFIED WITH: BMANCHERIL,PHARMD @1259  05/23/17 BY LHOWARD    Methicillin resistance NOT DETECTED NOT DETECTED Final   Streptococcus species NOT DETECTED NOT DETECTED Final    Streptococcus agalactiae NOT DETECTED NOT DETECTED Final   Streptococcus pneumoniae NOT DETECTED NOT DETECTED Final   Streptococcus pyogenes NOT DETECTED NOT DETECTED Final   Acinetobacter baumannii NOT DETECTED NOT DETECTED Final   Enterobacteriaceae species NOT DETECTED NOT DETECTED Final   Enterobacter cloacae complex NOT DETECTED NOT DETECTED Final   Escherichia coli NOT DETECTED NOT DETECTED Final   Klebsiella oxytoca NOT DETECTED NOT DETECTED Final   Klebsiella pneumoniae NOT DETECTED NOT DETECTED Final   Proteus species NOT DETECTED NOT DETECTED Final   Serratia marcescens NOT DETECTED NOT DETECTED Final   Haemophilus influenzae NOT DETECTED NOT DETECTED Final   Neisseria meningitidis NOT DETECTED NOT DETECTED Final   Pseudomonas aeruginosa NOT DETECTED NOT DETECTED Final   Candida albicans NOT DETECTED NOT DETECTED Final   Candida glabrata NOT DETECTED NOT DETECTED Final   Candida krusei NOT DETECTED NOT DETECTED Final   Candida parapsilosis NOT DETECTED NOT DETECTED Final   Candida tropicalis NOT DETECTED NOT DETECTED Final  Blood culture (routine x 2)     Status: None (Preliminary result)   Collection Time: 05/22/17  5:22 AM  Result Value Ref Range Status   Specimen Description BLOOD RIGHT ANTECUBITAL  Final   Special Requests   Final    IN BOTH AEROBIC AND ANAEROBIC BOTTLES Blood Culture adequate volume   Culture NO GROWTH  2 DAYS  Final   Report Status PENDING  Incomplete  Culture, blood (routine x 2)     Status: None (Preliminary result)   Collection Time: 05/23/17  5:55 PM  Result Value Ref Range Status   Specimen Description BLOOD LEFT ANTECUBITAL  Final   Special Requests   Final    BOTTLES DRAWN AEROBIC AND ANAEROBIC Blood Culture adequate volume   Culture NO GROWTH < 24 HOURS  Final   Report Status PENDING  Incomplete  Culture, blood (routine x 2)     Status: None (Preliminary result)   Collection Time: 05/23/17  8:12 PM  Result Value Ref Range Status    Specimen Description BLOOD LEFT ANTECUBITAL  Final   Special Requests   Final    BOTTLES DRAWN AEROBIC ONLY Blood Culture adequate volume   Culture NO GROWTH < 24 HOURS  Final   Report Status PENDING  Incomplete    Studies/Results: No results found.    Assessment/Plan:  INTERVAL HISTORY: He continues to complain of both lower back pain as well as left chest pain with obvious edema and tenderness in his left breast tissue   Principal Problem:   Bacteremia due to methicillin susceptible Staphylococcus aureus (MSSA) Active Problems:   IVDU (intravenous drug user)   Normocytic anemia   Chronic hepatitis C without hepatic coma (HCC)   Discitis of lumbar region   Hypokalemia   Psoas abscess, right (HCC)    Hunter [name] is a 31 y.o. male with history of IV drug use, prior hand infection and group A streptococcal bacteremia, status post surgery in November discharged on Augmentin which he stopped taking now admitted with MSSA bacteremia and sepsis, lumbar disc infection with psoas abscess       [facility] Staphylococcus aureus bacteremia   Staphylococcus aureus bacteremia (SAB) is associated with a high rate of complications and mortality.  Specific aspects of clinical management are critical to optimizing the outcome of patients with SAB.  Therefore, [facility]) has initiated an intervention aimed at improving the management of SAB [facility].  To do so, Infectious Diseases physicians are providing an evidence-based consult for the management of all patients with SAB.     Yes No Comments  Perform follow-up blood cultures (even if the patient is afebrile) to ensure clearance of bacteremia [x]  []  Blood cultures no growth x 24 hours  Remove vascular catheter and obtain follow-up blood cultures after the removal of the catheter []  []  DO NOT PLACE CENTRAL LINE  Perform echocardiography to evaluate for  endocarditis (transthoracic ECHO is 40-50% sensitive, TEE is > 90% sensitive) []  []  Please keep in mind, that neither test can definitively EXCLUDE endocarditis, and that should clinical suspicion remain high for endocarditis the patient should then still be treated with an "endocarditis" duration of therapy = 6 weeks  Consider repeat TEE   Consult electrophysiologist to evaluate implanted cardiac device (pacemaker, ICD) []  []    Ensure source control [x]  []  Have all abscesses been drained effectively? Have deep seeded infections (septic joints or osteomyelitis) had appropriate surgical debridement?  I am worried about the chest wall area that could have abscess, infection, not seen on CT scan but may require I and D  Investigate for "metastatic" sites of infection []  []  Does the patient have ANY symptom or physical exam finding that would suggest a deeper infection (back or neck pain that may be suggestive of vertebral osteomyelitis or epidural  abscess, muscle pain that could be a symptom of pyomyositis)?  Keep in mind that for deep seeded infections MRI imaging with contrast is preferred rather than other often insensitive tests such as plain x-rays, especially early in a patient's presentation.  Change antibiotic therapy to _Cefazolin []  []  Beta-lactam antibiotics are preferred for MSSA due to higher cure rates.   If on Vancomycin, goal trough should be 15 - 20 mcg/mL  Estimated duration of IV antibiotic therapy:  6 weeks, possibly in the hospital vs SNF vs less orthodox approach but would favor giving him textbook treatment if possible []  []  Consult case management for probably prolonged outpatient IV antibiotic therapy   #2  The above discussion he will need protracted therapy and I would favor systemic antibiotics but certainly not at home with a PICC line.  If he is going to continue parenteral antibiotics will need to be either in the hospital or in a monitored skilled nursing facility.  #3  possible chest wall abscess: Monitor area closely abscess was not seen on CT scan but it may not of "declared itself yet.  #4 IV drug use: He is open to treatment in Suboxone clinic   I spent greater than 35 minutes with the patient including greater than 50% of time in face to face counsel of the patient re his MSSA bacteremia, Lumbar infection, possible endocarditis, IVDU and in coordination of his care.    LOS: 2 days   Acey Lav 05/24/2017, 5:30 PM

## 2017-05-25 DIAGNOSIS — L818 Other specified disorders of pigmentation: Secondary | ICD-10-CM

## 2017-05-25 LAB — CBC
HCT: 30.6 % — ABNORMAL LOW (ref 39.0–52.0)
HEMOGLOBIN: 9.7 g/dL — AB (ref 13.0–17.0)
MCH: 27.3 pg (ref 26.0–34.0)
MCHC: 31.7 g/dL (ref 30.0–36.0)
MCV: 86.2 fL (ref 78.0–100.0)
Platelets: 491 10*3/uL — ABNORMAL HIGH (ref 150–400)
RBC: 3.55 MIL/uL — ABNORMAL LOW (ref 4.22–5.81)
RDW: 13.7 % (ref 11.5–15.5)
WBC: 11.7 10*3/uL — ABNORMAL HIGH (ref 4.0–10.5)

## 2017-05-25 LAB — BASIC METABOLIC PANEL
Anion gap: 7 (ref 5–15)
BUN: 6 mg/dL (ref 6–20)
CALCIUM: 8.5 mg/dL — AB (ref 8.9–10.3)
CHLORIDE: 103 mmol/L (ref 101–111)
CO2: 27 mmol/L (ref 22–32)
CREATININE: 0.57 mg/dL — AB (ref 0.61–1.24)
GFR calc Af Amer: >60 mL/min (ref >60)
GFR calc non Af Amer: >60 mL/min (ref >60)
Glucose, Bld: 104 mg/dL — ABNORMAL HIGH (ref 65–99)
Potassium: 4.3 mmol/L (ref 3.5–5.1)
SODIUM: 137 mmol/L (ref 135–145)

## 2017-05-25 LAB — CULTURE, BLOOD (ROUTINE X 2)

## 2017-05-25 LAB — GLUCOSE, CAPILLARY: GLUCOSE-CAPILLARY: 98 mg/dL (ref 65–99)

## 2017-05-25 MED ORDER — SENNOSIDES-DOCUSATE SODIUM 8.6-50 MG PO TABS
1.0000 | ORAL_TABLET | Freq: Every evening | ORAL | Status: DC | PRN
  Administered 2017-05-26 – 2017-05-29 (×2): 1 via ORAL
  Filled 2017-05-25 (×2): qty 1

## 2017-05-25 MED ORDER — MAGNESIUM OXIDE 400 (241.3 MG) MG PO TABS
400.0000 mg | ORAL_TABLET | Freq: Two times a day (BID) | ORAL | Status: AC
  Administered 2017-05-25 (×2): 400 mg via ORAL
  Filled 2017-05-25 (×2): qty 1

## 2017-05-25 MED ORDER — POLYETHYLENE GLYCOL 3350 17 G PO PACK
17.0000 g | PACK | Freq: Every day | ORAL | Status: DC | PRN
  Administered 2017-05-25 – 2017-05-27 (×3): 17 g via ORAL
  Filled 2017-05-25 (×3): qty 1

## 2017-05-25 NOTE — Progress Notes (Addendum)
PROGRESS NOTE    Rondall Radigan [facility]  WRU:[id number] DOB: [birth year] DOA: 05/22/2017 PCP: Patient, No Pcp Per      Brief Narrative:  31 yo M with hep C, IVDU presents with MSSA bacteremia and discitis.   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 Right greater than left paravertebral soft tissue inflammation with extensive right psoas inflammation/myositis. 20 x 6 mm right psoas abscess. Diffuse posterior paraspinal muscle inflammation in the lumbar spine suggestive of myositis. No evidence of infection in the cervical or thoracic spine. No epidural abscess. -Continue cefazolin IV -Consult to ID, appreciate cares -TTE on 12/17 without vegetation   IVDU (intravenous drug user) Suboxone as outpatient.    Normocytic anemia Stable, baseline Hgb 11s  Hypokalemia Resolved  VT Brief asymptomatic, NSVT. -Replace mag and K   DVT prophylaxis: Lovenox Code Status: FULL Family Communication: None present Disposition Plan: Per CM; will need 6-weeks antibiotics in monitored setting.  Either inpatient treatment or SNF.    Consultants:   ID  Neurosurgery  Procedures:   CTA chest  MRI cervical, lumbar and thoracic spine  TTE  Antimicrobials:   Vancomycin/Zosyn 12/15 >> 12/16  Cefazolin 12/16 >>  Culture data BCx 12/16 NGTD x2 BCx 12/15 MSSA in 1/2 bottles    Subjective: Feeling pain all over, pain in abdomen, pain in back.  No new fever, chills.  Constipated.  No  confusion.  Objective: Vitals:   05/24/17 2116 05/24/17 2225 05/25/17 0445 05/25/17 1403  BP: 130/80 127/77 117/68 123/81  Pulse: 82 83 97 88  Resp: 18 18 18 14   Temp: 98.6 F (37 C) 98.8 F (37.1 C) 98.5 F (36.9 C) 98.1 F (36.7 C)  TempSrc:   Oral Oral  SpO2: 100% 99% 96% 98%  Weight:   72.5 kg (159 lb 13.3 oz)   Height:        Intake/Output Summary (Last 24 hours) at 05/25/2017 1451 Last data filed at 05/25/2017 1250 Gross per 24 hour  Intake 2163 ml  Output 700 ml  Net 1463 ml   Filed Weights   05/23/17 0551 05/24/17 0615 05/25/17 0445  Weight: 72.8 kg (160 lb 6.4 oz) 72.5 kg (159 lb 13.3 oz) 72.5 kg (159 lb 13.3 oz)    Examination: General appearance: Thin tattooed adult male, alert and in mild distress from pain.   HEENT: Anicteric, conjunctiva pink, lids and lashes normal. No nasal deformity, discharge, epistaxis.  Lips dry.   Skin: Warm and dry.  No jaundice.  No suspicious rashes or lesions. Cardiac: Tachycardic, regular, nl S1-S2, no murmurs appreciated.  Capillary refill is brisk.  JVP normal.  No LE edema.  Radial pulses 2+ and symmetric. Respiratory: Normal respiratory rate and rhythm.  CTAB without rales or wheezes. Abdomen: Abdomen soft.  Mild nonfocal TTP without guarding. No ascites, distension, hepatosplenomegaly.   MSK: No deformities or effusions. Neuro: Awake and alert.  EOMI, moves all extremities. Speech fluent.    Psych: Sensorium intact and responding to questions, attention normal. Affect blunted.  Judgment and insight appear normal.  Data Reviewed: I have personally reviewed following labs and imaging studies:  CBC: Recent Labs  Lab 05/22/17 0031 05/22/17 1839 05/24/17 0246 05/25/17 0420  WBC 13.1* 9.9 11.3* 11.7*  NEUTROABS  --  7.1  --   --   HGB 9.2* 8.9* 9.2* 9.7*  HCT 28.4* 27.5* 29.2* 30.6*  MCV 85.8 85.4 85.4 86.2  PLT 380 391 422* 491*   Basic Metabolic Panel: Recent Labs  Lab 05/22/17 0031 05/22/17 1839  05/24/17 0246 05/25/17 0420  NA 132* 135 134* 137  K 3.4* 3.2* 4.0 4.3  CL 99* 101 101 103  CO2 25 27 26 27   GLUCOSE 126* 141* 105* 104*  BUN 12 5* 6 6  CREATININE 0.67 0.70 0.52* 0.57*  CALCIUM 8.2* 8.4* 8.6* 8.5*  MG  --  1.7  --   --   PHOS  --  2.6  --   --    GFR: Estimated Creatinine Clearance: 137.2 mL/min (A) (by C-G formula based on SCr of 0.57 mg/dL (L)). Liver Function Tests: Recent Labs  Lab 05/22/17 0522 05/22/17 1839  AST 31 29  ALT 20 20  ALKPHOS 83 84  BILITOT 0.4 0.4  PROT 6.9 7.1  ALBUMIN 2.3* 2.4*   Recent Labs  Lab 05/22/17 0522  LIPASE 25   No results for input(s): AMMONIA in the last 168 hours. Coagulation Profile: No results for input(s): INR, PROTIME in the last 168 hours. Cardiac Enzymes: No results for input(s): CKTOTAL, CKMB, CKMBINDEX, TROPONINI in the last 168 hours. BNP (last 3 results) No results for input(s): PROBNP in the last 8760 hours. HbA1C: No results for input(s): HGBA1C in the last 72 hours. CBG: Recent Labs  Lab 05/24/17 0803 05/25/17 0802  GLUCAP 122* 98   Lipid Profile: No results for input(s): CHOL, HDL, LDLCALC, TRIG, CHOLHDL, LDLDIRECT in the last 72 hours. Thyroid Function Tests: No results for input(s): TSH, T4TOTAL, FREET4, T3FREE, THYROIDAB in the last 72 hours. Anemia Panel: No results for input(s): VITAMINB12, FOLATE, FERRITIN, TIBC, IRON, RETICCTPCT in the last 72 hours. Urine analysis:    Component Value Date/Time   COLORURINE YELLOW 04/26/2017 1840   APPEARANCEUR CLEAR 04/26/2017 1840   LABSPEC 1.010 04/26/2017 1840   PHURINE 6.0 04/26/2017 1840   GLUCOSEU NEGATIVE 04/26/2017 1840   HGBUR NEGATIVE 04/26/2017 1840   BILIRUBINUR NEGATIVE 04/26/2017 1840   KETONESUR NEGATIVE 04/26/2017 1840   PROTEINUR NEGATIVE 04/26/2017 1840   NITRITE NEGATIVE 04/26/2017 1840   LEUKOCYTESUR NEGATIVE 04/26/2017 1840   Sepsis Labs: @LABRCNTIP (procalcitonin:4,lacticidven:4)  ) Recent Results (from the past 240  hour(s))  Blood culture (routine x 2)     Status: Abnormal   Collection Time: 05/22/17  5:15 AM  Result Value Ref Range Status   Specimen Description BLOOD RIGHT FOREARM  Final   Special Requests   Final    BOTTLES DRAWN AEROBIC ONLY Blood Culture results may not be optimal due to an inadequate volume of blood received in culture bottles   Culture  Setup Time   Final    GRAM POSITIVE COCCI IN CLUSTERS AEROBIC BOTTLE ONLY CRITICAL RESULT CALLED TO, READ BACK BY AND VERIFIED WITH: BMANCHERIL,PHARMD @1259  05/23/17 BY LHOWARD    Culture STAPHYLOCOCCUS AUREUS (A)  Final   Report Status 05/25/2017 FINAL  Final   Organism ID, Bacteria STAPHYLOCOCCUS AUREUS  Final      Susceptibility   Staphylococcus aureus - MIC*    CIPROFLOXACIN <=0.5 SENSITIVE Sensitive     ERYTHROMYCIN <=0.25 SENSITIVE Sensitive  GENTAMICIN <=0.5 SENSITIVE Sensitive     OXACILLIN 0.5 SENSITIVE Sensitive     TETRACYCLINE <=1 SENSITIVE Sensitive     VANCOMYCIN <=0.5 SENSITIVE Sensitive     TRIMETH/SULFA <=10 SENSITIVE Sensitive     CLINDAMYCIN <=0.25 SENSITIVE Sensitive     RIFAMPIN <=0.5 SENSITIVE Sensitive     Inducible Clindamycin NEGATIVE Sensitive     * STAPHYLOCOCCUS AUREUS  Blood Culture ID Panel (Reflexed)     Status: Abnormal   Collection Time: 05/22/17  5:15 AM  Result Value Ref Range Status   Enterococcus species NOT DETECTED NOT DETECTED Final   Listeria monocytogenes NOT DETECTED NOT DETECTED Final   Staphylococcus species DETECTED (A) NOT DETECTED Final    Comment: CRITICAL RESULT CALLED TO, READ BACK BY AND VERIFIED WITH: BMANCHERIL,PHARMD @1259  05/23/17 BY LHOWARD    Staphylococcus aureus DETECTED (A) NOT DETECTED Final    Comment: Methicillin (oxacillin) susceptible Staphylococcus aureus (MSSA). Preferred therapy is anti staphylococcal beta lactam antibiotic (Cefazolin or Nafcillin), unless clinically contraindicated. CRITICAL RESULT CALLED TO, READ BACK BY AND VERIFIED WITH: BMANCHERIL,PHARMD  @1259  05/23/17 BY LHOWARD    Methicillin resistance NOT DETECTED NOT DETECTED Final   Streptococcus species NOT DETECTED NOT DETECTED Final   Streptococcus agalactiae NOT DETECTED NOT DETECTED Final   Streptococcus pneumoniae NOT DETECTED NOT DETECTED Final   Streptococcus pyogenes NOT DETECTED NOT DETECTED Final   Acinetobacter baumannii NOT DETECTED NOT DETECTED Final   Enterobacteriaceae species NOT DETECTED NOT DETECTED Final   Enterobacter cloacae complex NOT DETECTED NOT DETECTED Final   Escherichia coli NOT DETECTED NOT DETECTED Final   Klebsiella oxytoca NOT DETECTED NOT DETECTED Final   Klebsiella pneumoniae NOT DETECTED NOT DETECTED Final   Proteus species NOT DETECTED NOT DETECTED Final   Serratia marcescens NOT DETECTED NOT DETECTED Final   Haemophilus influenzae NOT DETECTED NOT DETECTED Final   Neisseria meningitidis NOT DETECTED NOT DETECTED Final   Pseudomonas aeruginosa NOT DETECTED NOT DETECTED Final   Candida albicans NOT DETECTED NOT DETECTED Final   Candida glabrata NOT DETECTED NOT DETECTED Final   Candida krusei NOT DETECTED NOT DETECTED Final   Candida parapsilosis NOT DETECTED NOT DETECTED Final   Candida tropicalis NOT DETECTED NOT DETECTED Final  Blood culture (routine x 2)     Status: None (Preliminary result)   Collection Time: 05/22/17  5:22 AM  Result Value Ref Range Status   Specimen Description BLOOD RIGHT ANTECUBITAL  Final   Special Requests   Final    IN BOTH AEROBIC AND ANAEROBIC BOTTLES Blood Culture adequate volume   Culture NO GROWTH 2 DAYS  Final   Report Status PENDING  Incomplete  Culture, blood (routine x 2)     Status: None (Preliminary result)   Collection Time: 05/23/17  5:55 PM  Result Value Ref Range Status   Specimen Description BLOOD LEFT ANTECUBITAL  Final   Special Requests   Final    BOTTLES DRAWN AEROBIC AND ANAEROBIC Blood Culture adequate volume   Culture NO GROWTH < 24 HOURS  Final   Report Status PENDING  Incomplete   Culture, blood (routine x 2)     Status: None (Preliminary result)   Collection Time: 05/23/17  8:12 PM  Result Value Ref Range Status   Specimen Description BLOOD LEFT ANTECUBITAL  Final   Special Requests   Final    BOTTLES DRAWN AEROBIC ONLY Blood Culture adequate volume   Culture NO GROWTH < 24 HOURS  Final   Report Status PENDING  Incomplete  Radiology Studies: No results found.      Scheduled Meds: . enoxaparin (LOVENOX) injection  40 mg Subcutaneous Q24H  . magnesium oxide  400 mg Oral BID  . sodium chloride flush  3 mL Intravenous Q12H   Continuous Infusions: . sodium chloride 75 mL/hr at 05/25/17 1250  .  ceFAZolin (ANCEF) IV 2 g (05/25/17 1421)     LOS: 3 days    Time spent: 45 minutes    Alberteen Samhristopher P Danford, MD [facility] Pager [phone]  If 7PM-7AM, please contact night-coverage [email or web address] Password [email or web address] 05/25/2017, 2:51 PM

## 2017-05-25 NOTE — [place] FL2 (Signed)
[place] MEDICAID FL2 LEVEL OF CARE SCREENING TOOL     IDENTIFICATION  Patient Name: Hunter [name] Birthdate: 29-Dec-1985 Sex: male Admission Date (Current Location): 05/22/2017  [facility] [place] [place]   Facility and Address:  [facility], [address], [place], [place] [address]      Provider Number: [id number]  Attending Physician Name and Address:  Alberteen Samanford, Christopher P, *  Relative Name and Phone Number:       Current Level of Care: Hospital Recommended Level of Care: Skilled Nursing Facility Prior Approval Number:    Date Approved/Denied:   PASRR Number:    Discharge Plan: SNF    Current Diagnoses: Patient Active Problem List   Diagnosis Date Noted  . Atelectasis of both lungs   . Chest wall abscess   . Psoas abscess (HCC) 05/23/2017  . Bacteremia due to methicillin susceptible Staphylococcus aureus (MSSA) 05/23/2017  . Discitis of lumbar region 05/22/2017  . Hypokalemia 05/22/2017  . Normocytic anemia 04/28/2017  . Chronic hepatitis C without hepatic coma (HCC) 04/28/2017  . IVDU (intravenous drug user) 04/27/2017    Orientation RESPIRATION BLADDER Height & Weight     Self, Time, Situation, Place  Normal Continent Weight: 72.5 kg (159 lb 13.3 oz) Height:  6\' 1"  (185.4 cm)  BEHAVIORAL SYMPTOMS/MOOD NEUROLOGICAL BOWEL NUTRITION STATUS      Continent Diet(Regular)  AMBULATORY STATUS COMMUNICATION OF NEEDS Skin   Independent Verbally Surgical wounds(Closed incision on arm)                       Personal Care Assistance Level of Assistance  Feeding, Dressing, Bathing Bathing Assistance: Independent Feeding assistance: Independent Dressing Assistance: Independent     Functional Limitations Info             SPECIAL CARE FACTORS FREQUENCY                       Contractures Contractures Info: Not present    Additional Factors Info  Code Status, Allergies Code Status Info:  Full Allergies Info: NKA           Current Medications (05/25/2017):  This is the current hospital active medication list Current Facility-Administered Medications  Medication Dose Route Frequency Provider Last Rate Last Dose  . 0.9 %  sodium chloride infusion   Intravenous Continuous Alison Murrayevine, Alma M, MD 75 mL/hr at 05/25/17 1250    . acetaminophen (TYLENOL) tablet 650 mg  650 mg Oral Q6H PRN Alison Murrayevine, Alma M, MD      . ceFAZolin (ANCEF) IVPB 2g/100 mL premix  2 g Intravenous Q8H Cliffton Astersampbell, John, MD   Stopped at 05/25/17 1501  . enoxaparin (LOVENOX) injection 40 mg  40 mg Subcutaneous Q24H Scarlett Prestogan, Theresa D, RPH   40 mg at 05/24/17 2130  . HYDROmorphone (DILAUDID) injection 1 mg  1 mg Intravenous Q3H PRN Alison Murrayevine, Alma M, MD   1 mg at 05/25/17 1250  . magnesium oxide (MAG-OX) tablet 400 mg  400 mg Oral BID Alberteen Samanford, Christopher P, MD      . ondansetron [facility](ZOFRAN) tablet 4 mg  4 mg Oral Q6H PRN Alison Murrayevine, Alma M, MD       Or  . ondansetron [facility](ZOFRAN) injection 4 mg  4 mg Intravenous Q6H PRN Alison Murrayevine, Alma M, MD      . polyethylene glycol (MIRALAX / GLYCOLAX) packet 17 g  17 g Oral Daily PRN Danford, Earl Liteshristopher P, MD      .  senna-docusate (Senokot-S) tablet 1 tablet  1 tablet Oral QHS PRN Danford, Earl Liteshristopher P, MD      . sodium chloride flush (NS) 0.9 % injection 3 mL  3 mL Intravenous Q12H Alison Murrayevine, Alma M, MD   3 mL at 05/25/17 1004     Discharge Medications: Please see discharge summary for a list of discharge medications.  Relevant Imaging Results:  Relevant Lab Results:   Additional Information Is an IV drug user and will be an LOG-Needs IV ceFAZolin (ANCEF) IVPB 2g/100 mL q8 for 6 weeks  Mearl LatinNadia S Jyron Turman, [place]

## 2017-05-25 NOTE — Progress Notes (Addendum)
Subjective: Having a lot of back pain but the swelling and tenderness around his left nipple has gone down somewhat.   Antibiotics:  Anti-infectives (From admission, onward)   Start     Dose/Rate Route Frequency Ordered Stop   05/23/17 1700  ceFAZolin (ANCEF) IVPB 2g/100 mL premix     2 g 200 mL/hr over 30 Minutes Intravenous Every 8 hours 05/23/17 1628     05/23/17 0400  vancomycin (VANCOCIN) IVPB 1000 mg/200 mL premix  Status:  Discontinued     1,000 mg 200 mL/hr over 60 Minutes Intravenous Every 8 hours 05/22/17 1733 05/23/17 1455   05/22/17 2000  piperacillin-tazobactam (ZOSYN) IVPB 3.375 g  Status:  Discontinued     3.375 g 12.5 mL/hr over 240 Minutes Intravenous Every 8 hours 05/22/17 1733 05/23/17 1628   05/22/17 1745  vancomycin (VANCOCIN) 500 mg in sodium chloride 0.9 % 100 mL IVPB     500 mg 100 mL/hr over 60 Minutes Intravenous  Once 05/22/17 1733 05/22/17 2042   05/22/17 1115  vancomycin (VANCOCIN) IVPB 1000 mg/200 mL premix     1,000 mg 200 mL/hr over 60 Minutes Intravenous  Once 05/22/17 1100 05/22/17 1220   05/22/17 1115  piperacillin-tazobactam (ZOSYN) IVPB 3.375 g     3.375 g 100 mL/hr over 30 Minutes Intravenous  Once 05/22/17 1100 05/22/17 1744   05/22/17 0000  azithromycin (ZITHROMAX) 250 MG tablet     250 mg Oral Daily 05/22/17 1051        Medications: Scheduled Meds: . enoxaparin (LOVENOX) injection  40 mg Subcutaneous Q24H  . magnesium oxide  400 mg Oral BID  . sodium chloride flush  3 mL Intravenous Q12H   Continuous Infusions: . sodium chloride 75 mL/hr at 05/25/17 1250  .  ceFAZolin (ANCEF) IV Stopped (05/25/17 1501)   PRN Meds:.acetaminophen, HYDROmorphone (DILAUDID) injection, ondansetron **OR** ondansetron (ZOFRAN) IV, polyethylene glycol, senna-docusate    Objective: Weight change: 0 lb (0 kg)  Intake/Output Summary (Last 24 hours) at 05/25/2017 1644 Last data filed at 05/25/2017 1501 Gross per 24 hour  Intake 3093 ml   Output 700 ml  Net 2393 ml   Blood pressure 123/81, pulse 88, temperature 98.1 F (36.7 C), temperature source Oral, resp. rate 14, height 6\' 1"  (1.854 m), weight 159 lb 13.3 oz (72.5 kg), SpO2 98 %. Temp:  [98.1 F (36.7 C)-98.8 F (37.1 C)] 98.1 F (36.7 C) (12/18 1403) Pulse Rate:  [82-97] 88 (12/18 1403) Resp:  [14-18] 14 (12/18 1403) BP: (117-130)/(68-81) 123/81 (12/18 1403) SpO2:  [96 %-100 %] 98 % (12/18 1403) Weight:  [159 lb 13.3 oz (72.5 kg)] 159 lb 13.3 oz (72.5 kg) (12/18 0445)  Physical Exam: General: Alert and awake, oriented x3, HEENT: anicteric sclera, pupils reactive to light and accommodation, EOMI CVS regular rate, normal r,  no murmur rubs or gallops Chest: clear to auscultation bilaterally, no wheezing, rales or rhonchi Abdomen: soft nontender, nondistended, normal bowel sounds, Extremities: no  clubbing or edema noted bilaterally Skin: no rashes, scar right hand, tenderness near left nipple and chest with tenderness though area of fluctuance has gone down, tattoos  Neuro: nonfocal  CBC:  CBC Latest Ref Rng & Units 05/25/2017 05/24/2017 05/22/2017  WBC 4.0 - 10.5 K/uL 11.7(H) 11.3(H) 9.9  Hemoglobin 13.0 - 17.0 g/dL 5.7(Q) 4.6(N) 8.9(L)  Hematocrit 39.0 - 52.0 % 30.6(L) 29.2(L) 27.5(L)  Platelets 150 - 400 K/uL 491(H) 422(H) 391      BMET Recent Labs  05/24/17 0246 05/25/17 0420  NA 134* 137  K 4.0 4.3  CL 101 103  CO2 26 27  GLUCOSE 105* 104*  BUN 6 6  CREATININE 0.52* 0.57*  CALCIUM 8.6* 8.5*     Liver Panel  Recent Labs    05/22/17 1839  PROT 7.1  ALBUMIN 2.4*  AST 29  ALT 20  ALKPHOS 84  BILITOT 0.4       Sedimentation Rate No results for input(s): ESRSEDRATE in the last 72 hours. C-Reactive Protein No results for input(s): CRP in the last 72 hours.  Micro Results: Recent Results (from the past 720 hour(s))  Blood culture (routine x 2)     Status: Abnormal   Collection Time: 04/26/17  4:15 PM  Result Value Ref  Range Status   Specimen Description BLOOD BLOOD LEFT ARM  Final   Special Requests   Final    BOTTLES DRAWN AEROBIC AND ANAEROBIC Blood Culture results may not be optimal due to an excessive volume of blood received in culture bottles   Culture  Setup Time   Final    GRAM POSITIVE COCCI IN CHAINS IN BOTH AEROBIC AND ANAEROBIC BOTTLES CRITICAL RESULT CALLED TO, READ BACK BY AND VERIFIED WITH: MTURNER,PHARMD @0851  04/27/17 BY LHOWARD    Culture (A)  Final    GROUP A STREP (S.PYOGENES) ISOLATED HEALTH DEPARTMENT NOTIFIED Performed at [facility], [address] [place], [place] [address]    Report Status 04/29/2017 FINAL  Final   Organism ID, Bacteria GROUP A STREP (S.PYOGENES) ISOLATED  Final      Susceptibility   Group a strep (s.pyogenes) isolated - MIC*    PENICILLIN <=0.06 SENSITIVE Sensitive     CEFTRIAXONE <=0.12 SENSITIVE Sensitive     ERYTHROMYCIN <=0.12 SENSITIVE Sensitive     LEVOFLOXACIN 0.5 SENSITIVE Sensitive     VANCOMYCIN 0.5 SENSITIVE Sensitive     * GROUP A STREP (S.PYOGENES) ISOLATED  Blood Culture ID Panel (Reflexed)     Status: Abnormal   Collection Time: 04/26/17  4:15 PM  Result Value Ref Range Status   Enterococcus species NOT DETECTED NOT DETECTED Final   Listeria monocytogenes NOT DETECTED NOT DETECTED Final   Staphylococcus species NOT DETECTED NOT DETECTED Final   Staphylococcus aureus NOT DETECTED NOT DETECTED Final   Streptococcus species DETECTED (A) NOT DETECTED Final    Comment: CRITICAL RESULT CALLED TO, READ BACK BY AND VERIFIED WITH: MTURNER,PHARMD @0951  04/27/17 BY LHOWARD    Streptococcus agalactiae NOT DETECTED NOT DETECTED Final   Streptococcus pneumoniae NOT DETECTED NOT DETECTED Final   Streptococcus pyogenes DETECTED (A) NOT DETECTED Final    Comment: CRITICAL RESULT CALLED TO, READ BACK BY AND VERIFIED WITH: MTURNER,PHARMD BY LHOWARD @0951  04/27/17    Acinetobacter baumannii NOT DETECTED NOT DETECTED Final   Enterobacteriaceae  species NOT DETECTED NOT DETECTED Final   Enterobacter cloacae complex NOT DETECTED NOT DETECTED Final   Escherichia coli NOT DETECTED NOT DETECTED Final   Klebsiella oxytoca NOT DETECTED NOT DETECTED Final   Klebsiella pneumoniae NOT DETECTED NOT DETECTED Final   Proteus species NOT DETECTED NOT DETECTED Final   Serratia marcescens NOT DETECTED NOT DETECTED Final   Haemophilus influenzae NOT DETECTED NOT DETECTED Final   Neisseria meningitidis NOT DETECTED NOT DETECTED Final   Pseudomonas aeruginosa NOT DETECTED NOT DETECTED Final   Candida albicans NOT DETECTED NOT DETECTED Final   Candida glabrata NOT DETECTED NOT DETECTED Final   Candida krusei NOT DETECTED NOT DETECTED Final  Candida parapsilosis NOT DETECTED NOT DETECTED Final   Candida tropicalis NOT DETECTED NOT DETECTED Final    Comment: Performed at [facility], [address] [place], [place] [address]  Blood culture (routine x 2)     Status: Abnormal   Collection Time: 04/26/17  4:20 PM  Result Value Ref Range Status   Specimen Description BLOOD BLOOD LEFT HAND  Final   Special Requests   Final    BOTTLES DRAWN AEROBIC AND ANAEROBIC Blood Culture adequate volume   Culture  Setup Time   Final    GRAM POSITIVE COCCI IN CHAINS IN BOTH AEROBIC AND ANAEROBIC BOTTLES CRITICAL VALUE NOTED.  VALUE IS CONSISTENT WITH PREVIOUSLY REPORTED AND CALLED VALUE.    Culture (A)  Final    GROUP A STREP (S.PYOGENES) ISOLATED SUSCEPTIBILITIES PERFORMED ON PREVIOUS CULTURE WITHIN THE LAST 5 DAYS. HEALTH DEPARTMENT NOTIFIED Performed at [facility], [address] [place], [place] [address]    Report Status 04/29/2017 FINAL  Final  Wound or Superficial Culture     Status: None   Collection Time: 04/26/17  4:30 PM  Result Value Ref Range Status   Specimen Description WOUND RIGHT HAND  Final   Special Requests Normal  Final   Gram Stain   Final    NO WBC SEEN FEW GRAM POSITIVE COCCI Performed at [facility],  [address] [place], [place] [address]    Culture   Final    MODERATE GROUP A STREP (S.PYOGENES) ISOLATED MODERATE STAPHYLOCOCCUS AUREUS    Report Status 04/29/2017 FINAL  Final   Organism ID, Bacteria STAPHYLOCOCCUS AUREUS  Final      Susceptibility   Staphylococcus aureus - MIC*    CIPROFLOXACIN <=0.5 SENSITIVE Sensitive     ERYTHROMYCIN <=0.25 SENSITIVE Sensitive     GENTAMICIN <=0.5 SENSITIVE Sensitive     OXACILLIN 0.5 SENSITIVE Sensitive     TETRACYCLINE <=1 SENSITIVE Sensitive     VANCOMYCIN 1 SENSITIVE Sensitive     TRIMETH/SULFA <=10 SENSITIVE Sensitive     CLINDAMYCIN <=0.25 SENSITIVE Sensitive     RIFAMPIN <=0.5 SENSITIVE Sensitive     Inducible Clindamycin NEGATIVE Sensitive     * MODERATE STAPHYLOCOCCUS AUREUS  Surgical pcr screen     Status: Abnormal   Collection Time: 04/26/17  9:52 PM  Result Value Ref Range Status   MRSA, PCR NEGATIVE NEGATIVE Final   Staphylococcus aureus POSITIVE (A) NEGATIVE Final    Comment: (NOTE) The Xpert SA Assay (FDA approved for NASAL specimens in patients 922 years of age and older), is one component of a comprehensive surveillance program. It is not intended to diagnose infection nor to guide or monitor treatment.   Aerobic/Anaerobic Culture (surgical/deep wound)     Status: None   Collection Time: 04/26/17 11:17 PM  Result Value Ref Range Status   Specimen Description ABSCESS RIGHT HAND  Final   Special Requests PATIENT ON FOLLOWING ZOSYN AND VANC  Final   Gram Stain   Final    ABUNDANT WBC PRESENT, PREDOMINANTLY PMN RARE GRAM POSITIVE COCCI    Culture   Final    RARE GROUP A STREP (S.PYOGENES) ISOLATED NO ANAEROBES ISOLATED    Report Status 05/02/2017 FINAL  Final  Culture, blood (Routine X 2) w Reflex to ID Panel     Status: None   Collection Time: 04/29/17  2:29 PM  Result Value Ref Range Status   Specimen Description BLOOD RIGHT ANTECUBITAL  Final   Special  Requests IN PEDIATRIC BOTTLE Blood Culture adequate volume   Final   Culture NO GROWTH 5 DAYS  Final   Report Status 05/04/2017 FINAL  Final  Culture, blood (Routine X 2) w Reflex to ID Panel     Status: None   Collection Time: 04/29/17  2:40 PM  Result Value Ref Range Status   Specimen Description BLOOD LEFT FOREARM  Final   Special Requests IN PEDIATRIC BOTTLE Blood Culture adequate volume  Final   Culture NO GROWTH 5 DAYS  Final   Report Status 05/04/2017 FINAL  Final  Blood culture (routine x 2)     Status: Abnormal   Collection Time: 05/22/17  5:15 AM  Result Value Ref Range Status   Specimen Description BLOOD RIGHT FOREARM  Final   Special Requests   Final    BOTTLES DRAWN AEROBIC ONLY Blood Culture results may not be optimal due to an inadequate volume of blood received in culture bottles   Culture  Setup Time   Final    GRAM POSITIVE COCCI IN CLUSTERS AEROBIC BOTTLE ONLY CRITICAL RESULT CALLED TO, READ BACK BY AND VERIFIED WITH: BMANCHERIL,PHARMD @1259  05/23/17 BY LHOWARD    Culture STAPHYLOCOCCUS AUREUS (A)  Final   Report Status 05/25/2017 FINAL  Final   Organism ID, Bacteria STAPHYLOCOCCUS AUREUS  Final      Susceptibility   Staphylococcus aureus - MIC*    CIPROFLOXACIN <=0.5 SENSITIVE Sensitive     ERYTHROMYCIN <=0.25 SENSITIVE Sensitive     GENTAMICIN <=0.5 SENSITIVE Sensitive     OXACILLIN 0.5 SENSITIVE Sensitive     TETRACYCLINE <=1 SENSITIVE Sensitive     VANCOMYCIN <=0.5 SENSITIVE Sensitive     TRIMETH/SULFA <=10 SENSITIVE Sensitive     CLINDAMYCIN <=0.25 SENSITIVE Sensitive     RIFAMPIN <=0.5 SENSITIVE Sensitive     Inducible Clindamycin NEGATIVE Sensitive     * STAPHYLOCOCCUS AUREUS  Blood Culture ID Panel (Reflexed)     Status: Abnormal   Collection Time: 05/22/17  5:15 AM  Result Value Ref Range Status   Enterococcus species NOT DETECTED NOT DETECTED Final   Listeria monocytogenes NOT DETECTED NOT DETECTED Final   Staphylococcus species DETECTED (A) NOT DETECTED Final    Comment: CRITICAL RESULT CALLED TO, READ  BACK BY AND VERIFIED WITH: BMANCHERIL,PHARMD @1259  05/23/17 BY LHOWARD    Staphylococcus aureus DETECTED (A) NOT DETECTED Final    Comment: Methicillin (oxacillin) susceptible Staphylococcus aureus (MSSA). Preferred therapy is anti staphylococcal beta lactam antibiotic (Cefazolin or Nafcillin), unless clinically contraindicated. CRITICAL RESULT CALLED TO, READ BACK BY AND VERIFIED WITH: BMANCHERIL,PHARMD @1259  05/23/17 BY LHOWARD    Methicillin resistance NOT DETECTED NOT DETECTED Final   Streptococcus species NOT DETECTED NOT DETECTED Final   Streptococcus agalactiae NOT DETECTED NOT DETECTED Final   Streptococcus pneumoniae NOT DETECTED NOT DETECTED Final   Streptococcus pyogenes NOT DETECTED NOT DETECTED Final   Acinetobacter baumannii NOT DETECTED NOT DETECTED Final   Enterobacteriaceae species NOT DETECTED NOT DETECTED Final   Enterobacter cloacae complex NOT DETECTED NOT DETECTED Final   Escherichia coli NOT DETECTED NOT DETECTED Final   Klebsiella oxytoca NOT DETECTED NOT DETECTED Final   Klebsiella pneumoniae NOT DETECTED NOT DETECTED Final   Proteus species NOT DETECTED NOT DETECTED Final   Serratia marcescens NOT DETECTED NOT DETECTED Final   Haemophilus influenzae NOT DETECTED NOT DETECTED Final   Neisseria meningitidis NOT DETECTED NOT DETECTED Final   Pseudomonas aeruginosa NOT DETECTED NOT DETECTED Final   Candida albicans NOT DETECTED NOT DETECTED  Final   Candida glabrata NOT DETECTED NOT DETECTED Final   Candida krusei NOT DETECTED NOT DETECTED Final   Candida parapsilosis NOT DETECTED NOT DETECTED Final   Candida tropicalis NOT DETECTED NOT DETECTED Final  Blood culture (routine x 2)     Status: None (Preliminary result)   Collection Time: 05/22/17  5:22 AM  Result Value Ref Range Status   Specimen Description BLOOD RIGHT ANTECUBITAL  Final   Special Requests   Final    IN BOTH AEROBIC AND ANAEROBIC BOTTLES Blood Culture adequate volume   Culture NO GROWTH 3 DAYS   Final   Report Status PENDING  Incomplete  Culture, blood (routine x 2)     Status: None (Preliminary result)   Collection Time: 05/23/17  5:55 PM  Result Value Ref Range Status   Specimen Description BLOOD LEFT ANTECUBITAL  Final   Special Requests   Final    BOTTLES DRAWN AEROBIC AND ANAEROBIC Blood Culture adequate volume   Culture NO GROWTH 2 DAYS  Final   Report Status PENDING  Incomplete  Culture, blood (routine x 2)     Status: None (Preliminary result)   Collection Time: 05/23/17  8:12 PM  Result Value Ref Range Status   Specimen Description BLOOD LEFT ANTECUBITAL  Final   Special Requests   Final    BOTTLES DRAWN AEROBIC ONLY Blood Culture adequate volume   Culture NO GROWTH 2 DAYS  Final   Report Status PENDING  Incomplete    Studies/Results: No results found.    Assessment/Plan:  INTERVAL HISTORY:   Tenderness in breast tissue and area of fluctuance has gone down  Principal Problem:   Bacteremia due to methicillin susceptible Staphylococcus aureus (MSSA) Active Problems:   IVDU (intravenous drug user)   Normocytic anemia   Chronic hepatitis C without hepatic coma (HCC)   Discitis of lumbar region   Hypokalemia   Psoas abscess (HCC)   Atelectasis of both lungs   Chest wall abscess    Hunter [name] is a 31 y.o. male with history of IV drug use, prior hand infection and group A streptococcal bacteremia, status post surgery in November discharged on Augmentin which he stopped taking now admitted with MSSA bacteremia and sepsis, lumbar disc infection with psoas abscess       [place] [facility] Staphylococcus aureus bacteremia   Staphylococcus aureus bacteremia (SAB) is associated with a high rate of complications and mortality.  Specific aspects of clinical management are critical to optimizing the outcome of patients with SAB.  Therefore, the [facility] Management Team [facility]) has initiated an intervention aimed at  improving the management of SAB at [facility].  To do so, Infectious Diseases physicians are providing an evidence-based consult for the management of all patients with SAB.     Yes No Comments  Perform follow-up blood cultures (even if the patient is afebrile) to ensure clearance of bacteremia [x]  []  Blood cultures no growth x 48 hours  Remove vascular catheter and obtain follow-up blood cultures after the removal of the catheter []  []  DO NOT PLACE CENTRAL LINE  Perform echocardiography to evaluate for endocarditis (transthoracic ECHO is 40-50% sensitive, TEE is > 90% sensitive) []  []  Please keep in mind, that neither test can definitively EXCLUDE endocarditis, and that should clinical suspicion remain high for endocarditis the patient should then still be treated with an "endocarditis" duration of therapy = 6 weeks  Consider repeat TEE   Consult electrophysiologist to evaluate implanted  cardiac device (pacemaker, ICD) []  []    Ensure source control [x]  []  Have all abscesses been drained effectively? Have deep seeded infections (septic joints or osteomyelitis) had appropriate surgical debridement?  I am worried about the chest wall area that could have abscess, infection, not seen on CT scan but may require I and D for now continue abx and compresses  Investigate for "metastatic" sites of infection []  []  Does the patient have ANY symptom or physical exam finding that would suggest a deeper infection (back or neck pain that may be suggestive of vertebral osteomyelitis or epidural abscess, muscle pain that could be a symptom of pyomyositis)?  Keep in mind that for deep seeded infections MRI imaging with contrast is preferred rather than other often insensitive tests such as plain x-rays, especially early in a patient's presentation.  Change antibiotic therapy to _Cefazolin []  []  Beta-lactam antibiotics are preferred for MSSA due to higher cure rates.   If on Vancomycin, goal trough should be 15 - 20  mcg/mL  Estimated duration of IV antibiotic therapy:  6 weeks, possibly in the hospital vs SNF vs less orthodox approach but would favor giving him textbook treatment if possible []  []  Consult case management for probably prolonged outpatient IV antibiotic therapy   #2  The above discussion he will need protracted therapy and I would favor systemic antibiotics but certainly not at home with a PICC line.  If he is going to continue parenteral antibiotics will need to be either in the hospital or in a monitored skilled nursing facility.  #3 possible chest wall abscess: Monitor area closely abscess was not seen on CT scan but it may not of "declared itself yet. Start warm compresses  #4 IV drug use: He is open to treatment in Suboxone clinic> I will ask Dr. Oswaldo Done or Criselda Peaches to see him  #5 HCV + ab: check HCV RNA    LOS: 3 days   Acey Lav 05/25/2017, 4:44 PM

## 2017-05-26 LAB — CBC
HCT: 31.8 % — ABNORMAL LOW (ref 39.0–52.0)
Hemoglobin: 10 g/dL — ABNORMAL LOW (ref 13.0–17.0)
MCH: 27 pg (ref 26.0–34.0)
MCHC: 31.4 g/dL (ref 30.0–36.0)
MCV: 85.9 fL (ref 78.0–100.0)
PLATELETS: 508 10*3/uL — AB (ref 150–400)
RBC: 3.7 MIL/uL — AB (ref 4.22–5.81)
RDW: 13.7 % (ref 11.5–15.5)
WBC: 10.8 10*3/uL — AB (ref 4.0–10.5)

## 2017-05-26 LAB — BASIC METABOLIC PANEL
Anion gap: 10 (ref 5–15)
BUN: 9 mg/dL (ref 6–20)
CALCIUM: 9 mg/dL (ref 8.9–10.3)
CO2: 27 mmol/L (ref 22–32)
CREATININE: 0.52 mg/dL — AB (ref 0.61–1.24)
Chloride: 100 mmol/L — ABNORMAL LOW (ref 101–111)
GFR calc non Af Amer: >60 mL/min (ref >60)
Glucose, Bld: 97 mg/dL (ref 65–99)
Potassium: 3.9 mmol/L (ref 3.5–5.1)
SODIUM: 137 mmol/L (ref 135–145)

## 2017-05-26 LAB — MAGNESIUM: Magnesium: 1.9 mg/dL (ref 1.7–2.4)

## 2017-05-26 LAB — GLUCOSE, CAPILLARY: GLUCOSE-CAPILLARY: 102 mg/dL — AB (ref 65–99)

## 2017-05-26 MED ORDER — BISACODYL 10 MG RE SUPP: 10.0000 mg | Freq: Every day | RECTAL | Status: DC | PRN

## 2017-05-26 NOTE — Progress Notes (Signed)
Physical Therapy Treatment Patient Details Name: Hunter [name] XXXMarquez MRN: [id number] DOB: 10/08/1985 Today's Date: 05/26/2017    History of Present Illness Hunter [name] is a 31 y.o. male with medical history significant for IVDA, recent hospitalization for right forearm deep abscess and strep pyogenes bacteremia, hepatitis C who presented to ED with reports of shortness of breath for past 3-4 days prior to the admission associated with chest pain especially when taking a deep breath. MRI showed discitis/osteomyelitis of lumbar spine; Sepsis (HCC) / Osteomyelitis (HCC) / Discitis of lumbar region / Leukocytosis    PT Comments    Still very painful, but understands that he has to keep mobile.  Emphasis on education of log roll/rolling to get OOB, standing activity in RW and progressing ambulation.   Follow Up Recommendations  No PT follow up;Other (comment)([facility] and will be Indep[facility] by then)     Equipment Recommendations  Rolling walker with 5" wheels;3in1 (PT)    Recommendations for Other Services       Precautions / Restrictions Precautions Precautions: Fall;Back Precaution Comments: Back precautions for comfort Restrictions Weight Bearing Restrictions: No    Mobility  Bed Mobility Overal bed mobility: Needs Assistance Bed Mobility: Rolling;Sidelying to Sit Rolling: Supervision Sidelying to sit: Supervision       General bed mobility comments: v/c's for log rolling to minimize pain, no physical assist needed  Transfers Overall transfer level: Needs assistance Equipment used: Rolling walker (2 wheeled) Transfers: Sit to/from Stand Sit to Stand: Min guard         General transfer comment: vc for safe hand placement  Ambulation/Gait Ambulation/Gait assistance: Min guard Ambulation Distance (Feet): 160 Feet Assistive device: Rolling walker (2 wheeled) Gait Pattern/deviations: Step-through pattern Gait velocity: slow Gait velocity  interpretation: Below normal speed for age/gender General Gait Details: moderately heavy use of the RW, but otherwise generally steady with fluid gait pattern   Stairs            Wheelchair Mobility    Modified Rankin (Stroke Patients Only)       Balance Overall balance assessment: Needs assistance Sitting-balance support: Bilateral upper extremity supported;Feet supported Sitting balance-Leahy Scale: Fair Sitting balance - Comments: needs to brace himself on UE due to pain   Standing balance support: Bilateral upper extremity supported;During functional activity Standing balance-Leahy Scale: Poor Standing balance comment: reliant on RW due to pain                            Cognition Arousal/Alertness: Awake/alert Behavior During Therapy: WFL for tasks assessed/performed Overall Cognitive Status: Within Functional Limits for tasks assessed                                        Exercises      General Comments        Pertinent Vitals/Pain Pain Assessment: 0-10 Pain Score: 8  Pain Location: Back pain Pain Descriptors / Indicators: Constant;Grimacing;Guarding;Discomfort Pain Intervention(s): Monitored during session    Home Living                      Prior Function            PT Goals (current goals can now be found in the care plan section) Acute Rehab PT Goals Patient Stated Goal: less pain PT Goal Formulation:  With patient Time For Goal Achievement: 06/06/17 Potential to Achieve Goals: Good Progress towards PT goals: Progressing toward goals    Frequency    Min 3X/week      PT Plan Current plan remains appropriate    Co-evaluation PT/OT/SLP Co-Evaluation/Treatment: Yes Reason for Co-Treatment: Other (comment);To address functional/ADL transfers(pain management and activity toleranc) PT goals addressed during session: Mobility/safety with mobility OT goals addressed during session: ADL's and  self-care      AM-PAC PT "6 Clicks" Daily Activity  Outcome Measure  Difficulty turning over in bed (including adjusting bedclothes, sheets and blankets)?: Unable Difficulty moving from lying on back to sitting on the side of the bed? : Unable Difficulty sitting down on and standing up from a chair with arms (e.g., wheelchair, bedside commode, etc,.)?: Unable Help needed moving to and from a bed to chair (including a wheelchair)?: A Little Help needed walking in hospital room?: A Little Help needed climbing 3-5 steps with a railing? : A Little 6 Click Score: 12    End of Session   Activity Tolerance: Patient tolerated treatment well Patient left: in chair;with call bell/phone within reach;with chair alarm set Nurse Communication: Mobility status;Other (comment) PT Visit Diagnosis: Unsteadiness on feet (R26.81);Other abnormalities of gait and mobility (R26.89) Pain - part of body: (back)     Time: [id number] PT Time Calculation (min) (ACUTE ONLY): 30 min  Charges:  $Gait Training: 8-22 mins                    G Codes:       05/26/2017   BingKen Nadeen Shipman, PT [phone]  (pager)   Eliseo GumKenneth V Juanmanuel Marohl 05/26/2017, 6:22 PM

## 2017-05-26 NOTE — Progress Notes (Addendum)
PROGRESS NOTE    [name]  ZOX:[id number] DOB: [birth year] DOA: 05/22/2017 PCP: Patient, No Pcp Per      Brief Narrative:  31 yo M with hep C, IVDU presents with MSSA bacteremia and discitis.   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 Right greater than left paravertebral soft tissue inflammation with extensive right psoas inflammation/myositis. 20 x 6 mm right psoas abscess. Diffuse posterior paraspinal muscle inflammation in the lumbar spine suggestive of myositis. No evidence of infection in the cervical or thoracic spine. No epidural abscess. 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  No heroin or opiates in a long time.    Patient at present planning to move to [facility] to live with Dad, who he thinks would be willing to take him in (he got clean before while living with his dad).  He was in recovery for almost a year not that long ago here in [place], went to IOP at [facility]. Was on methadone at that time. Never did NA/AA before.  Also did an 18-day inpatient detox in [place], [place] once.  -ID will discuss with suboxone prescribers in the IMTS re:  suboxone, so that he can transition off IV dilaudid (which he is getting q4hrs)  Normocytic anemia Stable, baseline Hgb 11s  Hypokalemia Resolved  VT Brief asymptomatic, NSVT. -Replace mag and K   DVT prophylaxis: Lovenox Code Status: FULL Family Communication: None present Disposition Plan: Per CM; will need 6-weeks antibiotics in monitored setting.  CM tell me he will be inaptietn for 6 weeks.    Consultants:   ID  Neurosurgery  Cardiology for TEE  IMTS for Suboxone  Procedures:   CTA chest  MRI cervical, lumbar and thoracic spine  TTE  Antimicrobials:   Vancomycin/Zosyn 12/15 >> 12/16  Cefazolin 12/16 >>  Culture data BCx 12/16 NGTD x2 BCx 12/15 MSSA in 1/2 bottles    Subjective: Back pain no change.  No new fever, chlls.  Still constipated.  Able ot walk. No loss of bowel or bladder function.  Legs okay, just hurts to move.     Objective: Vitals:   05/25/17 2100 05/26/17 0500 05/26/17 0536 05/26/17 1416  BP: 132/78  121/71 128/70  Pulse: 61  87 78  Resp: 16  20 18   Temp: 98.6 F (37 C)  98.6 F (37 C) 98.8 F (37.1 C)  TempSrc: Oral  Oral Oral  SpO2: 100%  98% 98%  Weight:  74.5 kg (164 lb 3.9 oz)    Height:        Intake/Output Summary (Last 24 hours) at 05/26/2017 1502 Last data filed at 05/26/2017 1355 Gross per 24 hour  Intake 1468.75 ml  Output 3475 ml  Net -2006.25  ml   Filed Weights   05/24/17 0615 05/25/17 0445 05/26/17 0500  Weight: 72.5 kg (159 lb 13.3 oz) 72.5 kg (159 lb 13.3 oz) 74.5 kg (164 lb 3.9 oz)    Examination: General appearance: Thin tattooed adult male, alert and in mild distress from pain.   HEENT: Anicteric, conjunctiva pink, lids and lashes normal. No nasal deformity, discharge, epistaxis.  Lips dry.   Skin: Warm and dry.  No jaundice.  No suspicious rashes or lesions. Cardiac: Tachycardic, regular, nl S1-S2, no murmurs appreciated.  Capillary refill is brisk.  JVP normal.  No LE edema.  Radial pulses  2+ and symmetric.  I do not note fluctuance in the left upper chest. Respiratory: Normal respiratory rate and rhythm.  CTAB without rales or wheezes. Abdomen: Abdomen soft.  Mild nonfocal TTP without guarding. No ascites, distension, hepatosplenomegaly.   MSK: No deformities or effusions. Neuro: Awake and alert.  EOMI, moves all extremities. Speech fluent.    Psych: Sensorium intact and responding to questions, attention normal. Affect blunted.  Judgment and insight appear normal.    Data Reviewed: I have personally reviewed following labs and imaging studies:  CBC: Recent Labs  Lab 05/22/17 0031 05/22/17 1839 05/24/17 0246 05/25/17 0420 05/26/17 0305  WBC 13.1* 9.9 11.3* 11.7* 10.8*  NEUTROABS  --  7.1  --   --   --   HGB 9.2* 8.9* 9.2* 9.7* 10.0*  HCT 28.4* 27.5* 29.2* 30.6* 31.8*  MCV 85.8 85.4 85.4 86.2 85.9  PLT 380 391 422* 491* 508*   Basic Metabolic Panel: Recent Labs  Lab 05/22/17 0031 05/22/17 1839 05/24/17 0246 05/25/17 0420 05/26/17 0305  NA 132* 135 134* 137 137  K 3.4* 3.2* 4.0 4.3 3.9  CL 99* 101 101 103 100*  CO2 25 27 26 27 27   GLUCOSE 126* 141* 105* 104* 97  BUN 12 5* 6 6 9   CREATININE 0.67 0.70 0.52* 0.57* 0.52*  CALCIUM 8.2* 8.4* 8.6* 8.5* 9.0  MG  --  1.7  --   --  1.9  PHOS  --  2.6  --   --   --    GFR: Estimated Creatinine Clearance: 141 mL/min (A) (by C-G formula based on SCr of 0.52 mg/dL (L)). Liver Function Tests: Recent Labs  Lab 05/22/17 0522 05/22/17 1839  AST 31 29  ALT 20 20  ALKPHOS 83 84  BILITOT 0.4 0.4  PROT 6.9 7.1  ALBUMIN 2.3* 2.4*   Recent Labs  Lab 05/22/17 0522  LIPASE 25   No results for input(s): AMMONIA in the last 168 hours. Coagulation Profile: No results for input(s): INR, PROTIME in the last 168 hours. Cardiac Enzymes: No results for input(s): CKTOTAL, CKMB, CKMBINDEX, TROPONINI in the last 168 hours. BNP (last 3 results) No results for input(s): PROBNP in the last 8760 hours. HbA1C: No results  for input(s): HGBA1C in the last 72 hours. CBG: Recent Labs  Lab 05/24/17 0803 05/25/17 0802 05/26/17 0753  GLUCAP 122* 98 102*   Lipid Profile: No results for input(s): CHOL, HDL, LDLCALC, TRIG, CHOLHDL, LDLDIRECT in the last 72 hours. Thyroid Function Tests: No results for input(s): TSH, T4TOTAL, FREET4, T3FREE, THYROIDAB in the last 72 hours. Anemia Panel: No results for input(s): VITAMINB12, FOLATE, FERRITIN, TIBC, IRON, RETICCTPCT in the last 72 hours. Urine analysis:    Component Value Date/Time   COLORURINE YELLOW 04/26/2017 1840   APPEARANCEUR CLEAR 04/26/2017 1840   LABSPEC 1.010 04/26/2017 1840   PHURINE 6.0 04/26/2017 1840  GLUCOSEU NEGATIVE 04/26/2017 1840   HGBUR NEGATIVE 04/26/2017 1840   BILIRUBINUR NEGATIVE 04/26/2017 1840   KETONESUR NEGATIVE 04/26/2017 1840   PROTEINUR NEGATIVE 04/26/2017 1840   NITRITE NEGATIVE 04/26/2017 1840   LEUKOCYTESUR NEGATIVE 04/26/2017 1840   Sepsis Labs: @LABRCNTIP (procalcitonin:4,lacticidven:4)  ) Recent Results (from the past 240 hour(s))  Blood culture (routine x 2)     Status: Abnormal   Collection Time: 05/22/17  5:15 AM  Result Value Ref Range Status   Specimen Description BLOOD RIGHT FOREARM  Final   Special Requests   Final    BOTTLES DRAWN AEROBIC ONLY Blood Culture results may not be optimal due to an inadequate volume of blood received in culture bottles   Culture  Setup Time   Final    GRAM POSITIVE COCCI IN CLUSTERS AEROBIC BOTTLE ONLY CRITICAL RESULT CALLED TO, READ BACK BY AND VERIFIED WITH: BMANCHERIL,PHARMD @1259  05/23/17 BY LHOWARD    Culture STAPHYLOCOCCUS AUREUS (A)  Final   Report Status 05/25/2017 FINAL  Final   Organism ID, Bacteria STAPHYLOCOCCUS AUREUS  Final      Susceptibility   Staphylococcus aureus - MIC*    CIPROFLOXACIN <=0.5 SENSITIVE Sensitive     ERYTHROMYCIN <=0.25 SENSITIVE Sensitive     GENTAMICIN <=0.5 SENSITIVE Sensitive     OXACILLIN 0.5 SENSITIVE Sensitive     TETRACYCLINE  <=1 SENSITIVE Sensitive     VANCOMYCIN <=0.5 SENSITIVE Sensitive     TRIMETH/SULFA <=10 SENSITIVE Sensitive     CLINDAMYCIN <=0.25 SENSITIVE Sensitive     RIFAMPIN <=0.5 SENSITIVE Sensitive     Inducible Clindamycin NEGATIVE Sensitive     * STAPHYLOCOCCUS AUREUS  Blood Culture ID Panel (Reflexed)     Status: Abnormal   Collection Time: 05/22/17  5:15 AM  Result Value Ref Range Status   Enterococcus species NOT DETECTED NOT DETECTED Final   Listeria monocytogenes NOT DETECTED NOT DETECTED Final   Staphylococcus species DETECTED (A) NOT DETECTED Final    Comment: CRITICAL RESULT CALLED TO, READ BACK BY AND VERIFIED WITH: BMANCHERIL,PHARMD @1259  05/23/17 BY LHOWARD    Staphylococcus aureus DETECTED (A) NOT DETECTED Final    Comment: Methicillin (oxacillin) susceptible Staphylococcus aureus (MSSA). Preferred therapy is anti staphylococcal beta lactam antibiotic (Cefazolin or Nafcillin), unless clinically contraindicated. CRITICAL RESULT CALLED TO, READ BACK BY AND VERIFIED WITH: BMANCHERIL,PHARMD @1259  05/23/17 BY LHOWARD    Methicillin resistance NOT DETECTED NOT DETECTED Final   Streptococcus species NOT DETECTED NOT DETECTED Final   Streptococcus agalactiae NOT DETECTED NOT DETECTED Final   Streptococcus pneumoniae NOT DETECTED NOT DETECTED Final   Streptococcus pyogenes NOT DETECTED NOT DETECTED Final   Acinetobacter baumannii NOT DETECTED NOT DETECTED Final   Enterobacteriaceae species NOT DETECTED NOT DETECTED Final   Enterobacter cloacae complex NOT DETECTED NOT DETECTED Final   Escherichia coli NOT DETECTED NOT DETECTED Final   Klebsiella oxytoca NOT DETECTED NOT DETECTED Final   Klebsiella pneumoniae NOT DETECTED NOT DETECTED Final   Proteus species NOT DETECTED NOT DETECTED Final   Serratia marcescens NOT DETECTED NOT DETECTED Final   Haemophilus influenzae NOT DETECTED NOT DETECTED Final   Neisseria meningitidis NOT DETECTED NOT DETECTED Final   Pseudomonas aeruginosa NOT  DETECTED NOT DETECTED Final   Candida albicans NOT DETECTED NOT DETECTED Final   Candida glabrata NOT DETECTED NOT DETECTED Final   Candida krusei NOT DETECTED NOT DETECTED Final   Candida parapsilosis NOT DETECTED NOT DETECTED Final   Candida tropicalis NOT DETECTED NOT DETECTED Final  Blood culture (routine x 2)  Status: None (Preliminary result)   Collection Time: 05/22/17  5:22 AM  Result Value Ref Range Status   Specimen Description BLOOD RIGHT ANTECUBITAL  Final   Special Requests   Final    IN BOTH AEROBIC AND ANAEROBIC BOTTLES Blood Culture adequate volume   Culture NO GROWTH 3 DAYS  Final   Report Status PENDING  Incomplete  Culture, blood (routine x 2)     Status: None (Preliminary result)   Collection Time: 05/23/17  5:55 PM  Result Value Ref Range Status   Specimen Description BLOOD LEFT ANTECUBITAL  Final   Special Requests   Final    BOTTLES DRAWN AEROBIC AND ANAEROBIC Blood Culture adequate volume   Culture NO GROWTH 2 DAYS  Final   Report Status PENDING  Incomplete  Culture, blood (routine x 2)     Status: None (Preliminary result)   Collection Time: 05/23/17  8:12 PM  Result Value Ref Range Status   Specimen Description BLOOD LEFT ANTECUBITAL  Final   Special Requests   Final    BOTTLES DRAWN AEROBIC ONLY Blood Culture adequate volume   Culture NO GROWTH 2 DAYS  Final   Report Status PENDING  Incomplete         Radiology Studies: No results found.      Scheduled Meds: . enoxaparin (LOVENOX) injection  40 mg Subcutaneous Q24H  . sodium chloride flush  3 mL Intravenous Q12H   Continuous Infusions: .  ceFAZolin (ANCEF) IV 2 g (05/26/17 1352)     LOS: 4 days    Time spent: 25 minutes    Alberteen Samhristopher P Veldon Wager, MD [facility] Pager [phone]  If 7PM-7AM, please contact night-coverage [email or web address] Password [email or web address] 05/26/2017, 3:02 PM

## 2017-05-26 NOTE — Progress Notes (Addendum)
Subjective: Pain in his chest and swelling going down with warm compresses   Antibiotics:  Anti-infectives (From admission, onward)   Start     Dose/Rate Route Frequency Ordered Stop   05/23/17 1700  ceFAZolin (ANCEF) IVPB 2g/100 mL premix     2 g 200 mL/hr over 30 Minutes Intravenous Every 8 hours 05/23/17 1628     05/23/17 0400  vancomycin (VANCOCIN) IVPB 1000 mg/200 mL premix  Status:  Discontinued     1,000 mg 200 mL/hr over 60 Minutes Intravenous Every 8 hours 05/22/17 1733 05/23/17 1455   05/22/17 2000  piperacillin-tazobactam (ZOSYN) IVPB 3.375 g  Status:  Discontinued     3.375 g 12.5 mL/hr over 240 Minutes Intravenous Every 8 hours 05/22/17 1733 05/23/17 1628   05/22/17 1745  vancomycin (VANCOCIN) 500 mg in sodium chloride 0.9 % 100 mL IVPB     500 mg 100 mL/hr over 60 Minutes Intravenous  Once 05/22/17 1733 05/22/17 2042   05/22/17 1115  vancomycin (VANCOCIN) IVPB 1000 mg/200 mL premix     1,000 mg 200 mL/hr over 60 Minutes Intravenous  Once 05/22/17 1100 05/22/17 1220   05/22/17 1115  piperacillin-tazobactam (ZOSYN) IVPB 3.375 g     3.375 g 100 mL/hr over 30 Minutes Intravenous  Once 05/22/17 1100 05/22/17 1744   05/22/17 0000  azithromycin (ZITHROMAX) 250 MG tablet     250 mg Oral Daily 05/22/17 1051        Medications: Scheduled Meds: . enoxaparin (LOVENOX) injection  40 mg Subcutaneous Q24H  . sodium chloride flush  3 mL Intravenous Q12H   Continuous Infusions: .  ceFAZolin (ANCEF) IV Stopped (05/26/17 0630)   PRN Meds:.acetaminophen, bisacodyl, HYDROmorphone (DILAUDID) injection, ondansetron **OR** ondansetron (ZOFRAN) IV, polyethylene glycol, senna-docusate    Objective: Weight change: 4 lb 6.5 oz (2 kg)  Intake/Output Summary (Last 24 hours) at 05/26/2017 1332 Last data filed at 05/26/2017 [id number] Gross per 24 hour  Intake 2638.75 ml  Output 2900 ml  Net -261.25 ml   Blood pressure 121/71, pulse 87, temperature 98.6 F (37 C),  temperature source Oral, resp. rate 20, height 6\' 1"  (1.854 m), weight 164 lb 3.9 oz (74.5 kg), SpO2 98 %. Temp:  [98.1 F (36.7 C)-98.6 F (37 C)] 98.6 F (37 C) (12/19 0536) Pulse Rate:  [61-88] 87 (12/19 0536) Resp:  [14-20] 20 (12/19 0536) BP: (121-132)/(71-81) 121/71 (12/19 0536) SpO2:  [98 %-100 %] 98 % (12/19 0536) Weight:  [164 lb 3.9 oz (74.5 kg)] 164 lb 3.9 oz (74.5 kg) (12/19 0500)  Physical Exam: General: Alert and awake, oriented x3, there are 2 friends in the room including his girlfriend who is asleep sitting in a chair and difficult to arouse HEENT: anicteric sclera, pupils reactive to light and accommodation, EOMI CVS regular rate, normal r,  no murmur rubs or gallops Chest: clear to auscultation bilaterally, no wheezing, rales or rhonchi Abdomen: soft nontender, nondistended, normal bowel sounds, Extremities: no  clubbing or edema noted bilaterally Skin: no rashes, scar right hand, tenderness near left nipple and chest with tenderness though area of fluctuance has gone down, tattoos  Neuro: nonfocal  CBC:  CBC Latest Ref Rng & Units 05/26/2017 05/25/2017 05/24/2017  WBC 4.0 - 10.5 K/uL 10.8(H) 11.7(H) 11.3(H)  Hemoglobin 13.0 - 17.0 g/dL 10.0(L) 9.7(L) 9.2(L)  Hematocrit 39.0 - 52.0 % 31.8(L) 30.6(L) 29.2(L)  Platelets 150 - 400 K/uL 508(H) 491(H) 422(H)      BMET Recent Labs  05/25/17 0420 05/26/17 0305  NA 137 137  K 4.3 3.9  CL 103 100*  CO2 27 27  GLUCOSE 104* 97  BUN 6 9  CREATININE 0.57* 0.52*  CALCIUM 8.5* 9.0     Liver Panel  No results for input(s): PROT, ALBUMIN, AST, ALT, ALKPHOS, BILITOT, BILIDIR, IBILI in the last 72 hours.     Sedimentation Rate No results for input(s): ESRSEDRATE in the last 72 hours. C-Reactive Protein No results for input(s): CRP in the last 72 hours.  Micro Results: Recent Results (from the past 720 hour(s))  Blood culture (routine x 2)     Status: Abnormal   Collection Time: 04/26/17  4:15 PM   Result Value Ref Range Status   Specimen Description BLOOD BLOOD LEFT ARM  Final   Special Requests   Final    BOTTLES DRAWN AEROBIC AND ANAEROBIC Blood Culture results may not be optimal due to an excessive volume of blood received in culture bottles   Culture  Setup Time   Final    GRAM POSITIVE COCCI IN CHAINS IN BOTH AEROBIC AND ANAEROBIC BOTTLES CRITICAL RESULT CALLED TO, READ BACK BY AND VERIFIED WITH: MTURNER,PHARMD @0851  04/27/17 BY LHOWARD    Culture (A)  Final    GROUP A STREP (S.PYOGENES) ISOLATED HEALTH DEPARTMENT NOTIFIED Performed at [facility], [address], [place], [place] [address]    Report Status 04/29/2017 FINAL  Final   Organism ID, Bacteria GROUP A STREP (S.PYOGENES) ISOLATED  Final      Susceptibility   Group a strep (s.pyogenes) isolated - MIC*    PENICILLIN <=0.06 SENSITIVE Sensitive     CEFTRIAXONE <=0.12 SENSITIVE Sensitive     ERYTHROMYCIN <=0.12 SENSITIVE Sensitive     LEVOFLOXACIN 0.5 SENSITIVE Sensitive     VANCOMYCIN 0.5 SENSITIVE Sensitive     * GROUP A STREP (S.PYOGENES) ISOLATED  Blood Culture ID Panel (Reflexed)     Status: Abnormal   Collection Time: 04/26/17  4:15 PM  Result Value Ref Range Status   Enterococcus species NOT DETECTED NOT DETECTED Final   Listeria monocytogenes NOT DETECTED NOT DETECTED Final   Staphylococcus species NOT DETECTED NOT DETECTED Final   Staphylococcus aureus NOT DETECTED NOT DETECTED Final   Streptococcus species DETECTED (A) NOT DETECTED Final    Comment: CRITICAL RESULT CALLED TO, READ BACK BY AND VERIFIED WITH: MTURNER,PHARMD @0951  04/27/17 BY LHOWARD    Streptococcus agalactiae NOT DETECTED NOT DETECTED Final   Streptococcus pneumoniae NOT DETECTED NOT DETECTED Final   Streptococcus pyogenes DETECTED (A) NOT DETECTED Final    Comment: CRITICAL RESULT CALLED TO, READ BACK BY AND VERIFIED WITH: MTURNER,PHARMD BY LHOWARD @0951  04/27/17    Acinetobacter baumannii NOT DETECTED NOT DETECTED Final    Enterobacteriaceae species NOT DETECTED NOT DETECTED Final   Enterobacter cloacae complex NOT DETECTED NOT DETECTED Final   Escherichia coli NOT DETECTED NOT DETECTED Final   Klebsiella oxytoca NOT DETECTED NOT DETECTED Final   Klebsiella pneumoniae NOT DETECTED NOT DETECTED Final   Proteus species NOT DETECTED NOT DETECTED Final   Serratia marcescens NOT DETECTED NOT DETECTED Final   Haemophilus influenzae NOT DETECTED NOT DETECTED Final   Neisseria meningitidis NOT DETECTED NOT DETECTED Final   Pseudomonas aeruginosa NOT DETECTED NOT DETECTED Final   Candida albicans NOT DETECTED NOT DETECTED Final   Candida glabrata NOT DETECTED NOT DETECTED Final   Candida krusei NOT DETECTED NOT DETECTED Final   Candida parapsilosis NOT DETECTED NOT DETECTED Final   Candida  tropicalis NOT DETECTED NOT DETECTED Final    Comment: Performed at [facility], [address], [place], [place] [address]  Blood culture (routine x 2)     Status: Abnormal   Collection Time: 04/26/17  4:20 PM  Result Value Ref Range Status   Specimen Description BLOOD BLOOD LEFT HAND  Final   Special Requests   Final    BOTTLES DRAWN AEROBIC AND ANAEROBIC Blood Culture adequate volume   Culture  Setup Time   Final    GRAM POSITIVE COCCI IN CHAINS IN BOTH AEROBIC AND ANAEROBIC BOTTLES CRITICAL VALUE NOTED.  VALUE IS CONSISTENT WITH PREVIOUSLY REPORTED AND CALLED VALUE.    Culture (A)  Final    GROUP A STREP (S.PYOGENES) ISOLATED SUSCEPTIBILITIES PERFORMED ON PREVIOUS CULTURE WITHIN THE LAST 5 DAYS. HEALTH DEPARTMENT NOTIFIED Performed at [facility], [address] [place], [place] [address]    Report Status 04/29/2017 FINAL  Final  Wound or Superficial Culture     Status: None   Collection Time: 04/26/17  4:30 PM  Result Value Ref Range Status   Specimen Description WOUND RIGHT HAND  Final   Special Requests Normal  Final   Gram Stain   Final    NO WBC SEEN FEW GRAM POSITIVE COCCI Performed at [facility], [address], [place], [place] [address]    Culture   Final    MODERATE GROUP A STREP (S.PYOGENES) ISOLATED MODERATE STAPHYLOCOCCUS AUREUS    Report Status 04/29/2017 FINAL  Final   Organism ID, Bacteria STAPHYLOCOCCUS AUREUS  Final      Susceptibility   Staphylococcus aureus - MIC*    CIPROFLOXACIN <=0.5 SENSITIVE Sensitive     ERYTHROMYCIN <=0.25 SENSITIVE Sensitive     GENTAMICIN <=0.5 SENSITIVE Sensitive     OXACILLIN 0.5 SENSITIVE Sensitive     TETRACYCLINE <=1 SENSITIVE Sensitive     VANCOMYCIN 1 SENSITIVE Sensitive     TRIMETH/SULFA <=10 SENSITIVE Sensitive     CLINDAMYCIN <=0.25 SENSITIVE Sensitive     RIFAMPIN <=0.5 SENSITIVE Sensitive     Inducible Clindamycin NEGATIVE Sensitive     * MODERATE STAPHYLOCOCCUS AUREUS  Surgical pcr screen     Status: Abnormal   Collection Time: 04/26/17  9:52 PM  Result Value Ref Range Status   MRSA, PCR NEGATIVE NEGATIVE Final   Staphylococcus aureus POSITIVE (A) NEGATIVE Final    Comment: (NOTE) The Xpert SA Assay (FDA approved for NASAL specimens in patients 52 years of age and older), is one component of a comprehensive surveillance program. It is not intended to diagnose infection nor to guide or monitor treatment.   Aerobic/Anaerobic Culture (surgical/deep wound)     Status: None   Collection Time: 04/26/17 11:17 PM  Result Value Ref Range Status   Specimen Description ABSCESS RIGHT HAND  Final   Special Requests PATIENT ON FOLLOWING ZOSYN AND VANC  Final   Gram Stain   Final    ABUNDANT WBC PRESENT, PREDOMINANTLY PMN RARE GRAM POSITIVE COCCI    Culture   Final    RARE GROUP A STREP (S.PYOGENES) ISOLATED NO ANAEROBES ISOLATED    Report Status 05/02/2017 FINAL  Final  Culture, blood (Routine X 2) w Reflex to ID Panel     Status: None   Collection Time: 04/29/17  2:29 PM  Result Value Ref Range Status   Specimen Description BLOOD RIGHT ANTECUBITAL  Final   Special Requests IN PEDIATRIC BOTTLE Blood Culture  adequate volume  Final   Culture NO GROWTH 5 DAYS  Final   Report Status 05/04/2017 FINAL  Final  Culture, blood (Routine X 2) w Reflex to ID Panel     Status: None   Collection Time: 04/29/17  2:40 PM  Result Value Ref Range Status   Specimen Description BLOOD LEFT FOREARM  Final   Special Requests IN PEDIATRIC BOTTLE Blood Culture adequate volume  Final   Culture NO GROWTH 5 DAYS  Final   Report Status 05/04/2017 FINAL  Final  Blood culture (routine x 2)     Status: Abnormal   Collection Time: 05/22/17  5:15 AM  Result Value Ref Range Status   Specimen Description BLOOD RIGHT FOREARM  Final   Special Requests   Final    BOTTLES DRAWN AEROBIC ONLY Blood Culture results may not be optimal due to an inadequate volume of blood received in culture bottles   Culture  Setup Time   Final    GRAM POSITIVE COCCI IN CLUSTERS AEROBIC BOTTLE ONLY CRITICAL RESULT CALLED TO, READ BACK BY AND VERIFIED WITH: BMANCHERIL,PHARMD @1259  05/23/17 BY LHOWARD    Culture STAPHYLOCOCCUS AUREUS (A)  Final   Report Status 05/25/2017 FINAL  Final   Organism ID, Bacteria STAPHYLOCOCCUS AUREUS  Final      Susceptibility   Staphylococcus aureus - MIC*    CIPROFLOXACIN <=0.5 SENSITIVE Sensitive     ERYTHROMYCIN <=0.25 SENSITIVE Sensitive     GENTAMICIN <=0.5 SENSITIVE Sensitive     OXACILLIN 0.5 SENSITIVE Sensitive     TETRACYCLINE <=1 SENSITIVE Sensitive     VANCOMYCIN <=0.5 SENSITIVE Sensitive     TRIMETH/SULFA <=10 SENSITIVE Sensitive     CLINDAMYCIN <=0.25 SENSITIVE Sensitive     RIFAMPIN <=0.5 SENSITIVE Sensitive     Inducible Clindamycin NEGATIVE Sensitive     * STAPHYLOCOCCUS AUREUS  Blood Culture ID Panel (Reflexed)     Status: Abnormal   Collection Time: 05/22/17  5:15 AM  Result Value Ref Range Status   Enterococcus species NOT DETECTED NOT DETECTED Final   Listeria monocytogenes NOT DETECTED NOT DETECTED Final   Staphylococcus species DETECTED (A) NOT DETECTED Final    Comment: CRITICAL RESULT  CALLED TO, READ BACK BY AND VERIFIED WITH: BMANCHERIL,PHARMD @1259  05/23/17 BY LHOWARD    Staphylococcus aureus DETECTED (A) NOT DETECTED Final    Comment: Methicillin (oxacillin) susceptible Staphylococcus aureus (MSSA). Preferred therapy is anti staphylococcal beta lactam antibiotic (Cefazolin or Nafcillin), unless clinically contraindicated. CRITICAL RESULT CALLED TO, READ BACK BY AND VERIFIED WITH: BMANCHERIL,PHARMD @1259  05/23/17 BY LHOWARD    Methicillin resistance NOT DETECTED NOT DETECTED Final   Streptococcus species NOT DETECTED NOT DETECTED Final   Streptococcus agalactiae NOT DETECTED NOT DETECTED Final   Streptococcus pneumoniae NOT DETECTED NOT DETECTED Final   Streptococcus pyogenes NOT DETECTED NOT DETECTED Final   Acinetobacter baumannii NOT DETECTED NOT DETECTED Final   Enterobacteriaceae species NOT DETECTED NOT DETECTED Final   Enterobacter cloacae complex NOT DETECTED NOT DETECTED Final   Escherichia coli NOT DETECTED NOT DETECTED Final   Klebsiella oxytoca NOT DETECTED NOT DETECTED Final   Klebsiella pneumoniae NOT DETECTED NOT DETECTED Final   Proteus species NOT DETECTED NOT DETECTED Final   Serratia marcescens NOT DETECTED NOT DETECTED Final   Haemophilus influenzae NOT DETECTED NOT DETECTED Final   Neisseria meningitidis NOT DETECTED NOT DETECTED Final   Pseudomonas aeruginosa NOT DETECTED NOT DETECTED Final   Candida albicans NOT DETECTED NOT DETECTED Final   Candida glabrata NOT DETECTED NOT DETECTED Final  Candida krusei NOT DETECTED NOT DETECTED Final   Candida parapsilosis NOT DETECTED NOT DETECTED Final   Candida tropicalis NOT DETECTED NOT DETECTED Final  Blood culture (routine x 2)     Status: None (Preliminary result)   Collection Time: 05/22/17  5:22 AM  Result Value Ref Range Status   Specimen Description BLOOD RIGHT ANTECUBITAL  Final   Special Requests   Final    IN BOTH AEROBIC AND ANAEROBIC BOTTLES Blood Culture adequate volume   Culture NO  GROWTH 3 DAYS  Final   Report Status PENDING  Incomplete  Culture, blood (routine x 2)     Status: None (Preliminary result)   Collection Time: 05/23/17  5:55 PM  Result Value Ref Range Status   Specimen Description BLOOD LEFT ANTECUBITAL  Final   Special Requests   Final    BOTTLES DRAWN AEROBIC AND ANAEROBIC Blood Culture adequate volume   Culture NO GROWTH 2 DAYS  Final   Report Status PENDING  Incomplete  Culture, blood (routine x 2)     Status: None (Preliminary result)   Collection Time: 05/23/17  8:12 PM  Result Value Ref Range Status   Specimen Description BLOOD LEFT ANTECUBITAL  Final   Special Requests   Final    BOTTLES DRAWN AEROBIC ONLY Blood Culture adequate volume   Culture NO GROWTH 2 DAYS  Final   Report Status PENDING  Incomplete    Studies/Results: No results found.    Assessment/Plan:  INTERVAL HISTORY:   Tenderness in breast tissue and area of fluctuance has gone down  Principal Problem:   Bacteremia due to methicillin susceptible Staphylococcus aureus (MSSA) Active Problems:   IVDU (intravenous drug user)   Normocytic anemia   Chronic hepatitis C without hepatic coma (HCC)   Discitis of lumbar region   Hypokalemia   Psoas abscess (HCC)   Atelectasis of both lungs   Chest wall abscess    Hunter [name] is a 31 y.o. male with history of IV drug use, prior hand infection and group A streptococcal bacteremia, status post surgery in November discharged on Augmentin which he stopped taking now admitted with MSSA bacteremia and sepsis, lumbar disc infection with psoas abscess       [facility] Staphylococcus aureus bacteremia   Staphylococcus aureus bacteremia (SAB) is associated with a high rate of complications and mortality.  Specific aspects of clinical management are critical to optimizing the outcome of patients with SAB.  Therefore, the [facility] Antimicrobial Management Team [facility]) has initiated an  intervention aimed at improving the management of SAB at [facility].  To do so, Infectious Diseases physicians are providing an evidence-based consult for the management of all patients with SAB.     Yes No Comments  Perform follow-up blood cultures (even if the patient is afebrile) to ensure clearance of bacteremia [x]  []  Blood cultures no growth x72 hours  Remove vascular catheter and obtain follow-up blood cultures after the removal of the catheter []  []  DO NOT PLACE CENTRAL LINE  Perform echocardiography to evaluate for endocarditis (transthoracic ECHO is 40-50% sensitive, TEE is > 90% sensitive) []  []  Please keep in mind, that neither test can definitively EXCLUDE endocarditis, and that should clinical suspicion remain high for endocarditis the patient should then still be treated with an "endocarditis" duration of therapy = 6 weeks  Consider repeat TEE --I called Trish from Cardiology to ask if Cardiologist could evaluate adequacy of TTE vs considering TEE here in this  pt.  Consult electrophysiologist to evaluate implanted cardiac device (pacemaker, ICD) []  []    Ensure source control [x]  []  Have all abscesses been drained effectively? Have deep seeded infections (septic joints or osteomyelitis) had appropriate surgical debridement?  I am worried about the chest wall area that could have abscess, infection, not seen on CT scan but may require I and D for now continue abx and compresses  Investigate for "metastatic" sites of infection []  []  Does the patient have ANY symptom or physical exam finding that would suggest a deeper infection (back or neck pain that may be suggestive of vertebral osteomyelitis or epidural abscess, muscle pain that could be a symptom of pyomyositis)?  Keep in mind that for deep seeded infections MRI imaging with contrast is preferred rather than other often insensitive tests such as plain x-rays, especially early in a patient's presentation.  Change antibiotic therapy  to _Cefazolin []  []  Beta-lactam antibiotics are preferred for MSSA due to higher cure rates.   If on Vancomycin, goal trough should be 15 - 20 mcg/mL  Estimated duration of IV antibiotic therapy:  6 weeks, possibly in the hospital vs SNF vs less orthodox approach but would favor giving him textbook treatment if possible []  []  Consult case management for probably prolonged outpatient IV antibiotic therapy   #2  The above discussion he will need protracted therapy and I would favor systemic antibiotics but certainly not at home with a PICC line.  If he is going to continue parenteral antibiotics will need to be either in the hospital or in a monitored skilled nursing facility.  #3 possible chest wall abscess: Monitor area closely   Continue warm compresses  #4 IV drug use: He is open to treatment in Suboxone clinic> I will ask Dr. Oswaldo DoneVincent or Criselda PeachesMullen to see him  #5 HCV + ab: check HCV RNA    LOS: 4 days   Acey LavCornelius Van Dam 05/26/2017, 1:32 PM

## 2017-05-26 NOTE — Care Management Note (Signed)
Case Management Note  Patient Details  Name: Hunter [name] MRN: [id number] Date of Birth: 02/16/1986  Subjective/Objective:     Admitted with MSSA bacteremia and sepsis, lumbar disc infection with psoas abscess , hx of  IVDA, recent hospitalization for right forearm deep abscess and strep pyogenes bacteremia, hepatitis C.  Netta CorriganChristina [facility](Friend)     [phone]       Action/Plan: Per ID following , pt will need long term IV ABX infusion therapy. SNF placement and  Home health services will be hard to qualify pt  for 2/2 pt IV drug hx .Marland Kitchen.... CM following for transitional needs.   Expected Discharge Date:                  Expected Discharge Plan:  Home/Self Care(resides with friend, Trula OreChristina)  In-House Referral:  Clinical Social Work  Discharge planning Services  CM Consult  Post Acute Care Choice:    Choice offered to:     DME Arranged:    DME Agency:     HH Arranged:    HH Agency:     Status of Service:  In process, will continue to follow  If discussed at Long Length of Stay Meetings, dates discussed:    Additional Comments:  Epifanio LeschesCole, Karsten Howry Hudson, RN 05/26/2017, 7:23 PM

## 2017-05-26 NOTE — Progress Notes (Signed)
Occupational Therapy Treatment Patient Details Name: Hunter [name] MRN: [id number] DOB: 07/12/1985 Today's Date: 05/26/2017    History of present illness Hunter [name] is a 31 y.o. male with medical history significant for IVDA, recent hospitalization for right forearm deep abscess and strep pyogenes bacteremia, hepatitis C who presented to ED with reports of shortness of breath for past 3-4 days prior to the admission associated with chest pain especially when taking a deep breath. MRI showed discitis/osteomyelitis of lumbar spine; Sepsis (HCC) / Osteomyelitis (HCC) / Discitis of lumbar region / Leukocytosis   OT comments  Pt progressing towards OT goals this session demonstrating increased activity tolerance and increased safety during transfers. Pt very pleasant and excited to work with therapy throughout. Pt will be here for 6 weeks while getting IV antibiotics. Therapy's focus should be education for ADL with back pain: intro and use of AE and maximize independence prior to signing off. Next session, please bring AE and initiate AE education as well as standing balance for grooming tasks at sink.  Follow Up Recommendations  Other (comment)([facility] therapy)    Equipment Recommendations  Other (comment)(TBD)    Recommendations for Other Services      Precautions / Restrictions Precautions Precautions: Fall;Back Precaution Comments: Back precautions for comfort Restrictions Weight Bearing Restrictions: No       Mobility Bed Mobility Overal bed mobility: Needs Assistance Bed Mobility: Rolling;Sidelying to Sit Rolling: Supervision Sidelying to sit: Supervision       General bed mobility comments: v/c's for log rolling to minimize pain, no physical assist needed  Transfers Overall transfer level: Needs assistance Equipment used: Rolling walker (2 wheeled) Transfers: Sit to/from Stand Sit to Stand: Min guard         General  transfer comment: vc for safe hand placement    Balance Overall balance assessment: Needs assistance Sitting-balance support: Bilateral upper extremity supported;Feet supported Sitting balance-Leahy Scale: Fair Sitting balance - Comments: needs to brace self due to pain   Standing balance support: Bilateral upper extremity supported;During functional activity Standing balance-Leahy Scale: Poor Standing balance comment: reliant on RW due to pain                           ADL either performed or assessed with clinical judgement   ADL Overall ADL's : Needs assistance/impaired     Grooming: Min guard;Standing Grooming Details (indicate cue type and reason): leaning against sink for support and pain management                 Toilet Transfer: Min guard;Ambulation;RW Toilet Transfer Details (indicate cue type and reason): vc for safe hand placement for sit<>stand         Functional mobility during ADLs: Supervision/safety;Rolling walker       Vision       Perception     Praxis      Cognition Arousal/Alertness: Awake/alert Behavior During Therapy: WFL for tasks assessed/performed Overall Cognitive Status: Within Functional Limits for tasks assessed                                          Exercises     Shoulder Instructions       General Comments      Pertinent Vitals/ Pain       Pain Assessment: 0-10  Pain Score: 8  Pain Location: Back pain Pain Descriptors / Indicators: Constant;Grimacing;Guarding;Discomfort Pain Intervention(s): Monitored during session;Repositioned  Home Living                                          Prior Functioning/Environment              Frequency  Min 2X/week        Progress Toward Goals  OT Goals(current goals can now be found in the care plan section)  Progress towards OT goals: Progressing toward goals  Acute Rehab OT Goals Patient Stated Goal: less pain OT  Goal Formulation: With patient Time For Goal Achievement: 06/07/17 Potential to Achieve Goals: Good  Plan Discharge plan remains appropriate;Frequency needs to be updated    Co-evaluation    PT/OT/SLP Co-Evaluation/Treatment: Yes Reason for Co-Treatment: (pain management and activity tolerance) PT goals addressed during session: Mobility/safety with mobility OT goals addressed during session: ADL's and self-care      AM-PAC PT "6 Clicks" Daily Activity     Outcome Measure   Help from another person eating meals?: None Help from another person taking care of personal grooming?: A Little Help from another person toileting, which includes using toliet, bedpan, or urinal?: A Little Help from another person bathing (including washing, rinsing, drying)?: A Lot Help from another person to put on and taking off regular upper body clothing?: A Little Help from another person to put on and taking off regular lower body clothing?: A Lot 6 Click Score: 17    End of Session Equipment Utilized During Treatment: Rolling walker  OT Visit Diagnosis: Unsteadiness on feet (R26.81);Other abnormalities of gait and mobility (R26.89);Pain   Activity Tolerance Patient tolerated treatment well   Patient Left in chair;with call bell/phone within reach;with chair alarm set   Nurse Communication Mobility status        Time: [id number] OT Time Calculation (min): 25 min  Charges: OT General Charges $OT Visit: 1 Visit OT Treatments $Self Care/Home Management : 8-22 mins  Sherryl MangesLaura Suraya Vidrine OTR/L [phone]    Hunter BioLaura J Alzina [name] 05/26/2017, 5:50 PM

## 2017-05-27 DIAGNOSIS — M4626 Osteomyelitis of vertebra, lumbar region: Secondary | ICD-10-CM

## 2017-05-27 DIAGNOSIS — A4101 Sepsis due to Methicillin susceptible Staphylococcus aureus: Principal | ICD-10-CM

## 2017-05-27 LAB — GLUCOSE, CAPILLARY: Glucose-Capillary: 94 mg/dL (ref 65–99)

## 2017-05-27 LAB — CULTURE, BLOOD (ROUTINE X 2): CULTURE: NO GROWTH

## 2017-05-27 MED ORDER — SODIUM CHLORIDE 0.9 % IV SOLN
INTRAVENOUS | Status: DC
  Administered 2017-05-27 – 2017-07-02 (×5): via INTRAVENOUS

## 2017-05-27 MED ORDER — SODIUM CHLORIDE 0.9 % IV SOLN
INTRAVENOUS | Status: DC
  Administered 2017-05-28 (×2): via INTRAVENOUS

## 2017-05-27 NOTE — Progress Notes (Addendum)
    [facility] has been requested to perform [facility].  After careful review of history and examination, the risks and benefits of transesophageal echocardiogram have been explained including risks of esophageal damage, perforation (1:10,000 risk), bleeding, pharyngeal hematoma as well as other potential complications associated with conscious sedation including aspiration, arrhythmia, respiratory failure and death. Alternatives to treatment were discussed, questions were answered. Patient is willing to proceed.   Pt is scheduled for TEE 05/28/17 with Dr. Tenny Crawoss. NPO at [facility] please.  Hunter [name], [place]  05/27/2017 3:32 PM

## 2017-05-27 NOTE — Progress Notes (Addendum)
Subjective: No new complaints  Antibiotics:  Anti-infectives (From admission, onward)   Start     Dose/Rate Route Frequency Ordered Stop   05/23/17 1700  ceFAZolin (ANCEF) IVPB 2g/100 mL premix     2 g 200 mL/hr over 30 Minutes Intravenous Every 8 hours 05/23/17 1628     05/23/17 0400  vancomycin (VANCOCIN) IVPB 1000 mg/200 mL premix  Status:  Discontinued     1,000 mg 200 mL/hr over 60 Minutes Intravenous Every 8 hours 05/22/17 1733 05/23/17 1455   05/22/17 2000  piperacillin-tazobactam (ZOSYN) IVPB 3.375 g  Status:  Discontinued     3.375 g 12.5 mL/hr over 240 Minutes Intravenous Every 8 hours 05/22/17 1733 05/23/17 1628   05/22/17 1745  vancomycin (VANCOCIN) 500 mg in sodium chloride 0.9 % 100 mL IVPB     500 mg 100 mL/hr over 60 Minutes Intravenous  Once 05/22/17 1733 05/22/17 2042   05/22/17 1115  vancomycin (VANCOCIN) IVPB 1000 mg/200 mL premix     1,000 mg 200 mL/hr over 60 Minutes Intravenous  Once 05/22/17 1100 05/22/17 1220   05/22/17 1115  piperacillin-tazobactam (ZOSYN) IVPB 3.375 g     3.375 g 100 mL/hr over 30 Minutes Intravenous  Once 05/22/17 1100 05/22/17 1744   05/22/17 0000  azithromycin (ZITHROMAX) 250 MG tablet     250 mg Oral Daily 05/22/17 1051        Medications: Scheduled Meds: . enoxaparin (LOVENOX) injection  40 mg Subcutaneous Q24H  . sodium chloride flush  3 mL Intravenous Q12H   Continuous Infusions: . sodium chloride 10 mL/hr at 05/27/17 1245  .  ceFAZolin (ANCEF) IV Stopped (05/27/17 1334)   PRN Meds:.acetaminophen, bisacodyl, HYDROmorphone (DILAUDID) injection, ondansetron **OR** ondansetron (ZOFRAN) IV, polyethylene glycol, senna-docusate    Objective: Weight change: -3.3 oz (-1 kg)  Intake/Output Summary (Last 24 hours) at 05/27/2017 1730 Last data filed at 05/27/2017 8315 Gross per 24 hour  Intake 460 ml  Output 675 ml  Net -215 ml   Blood pressure 112/72, pulse 88, temperature 98 F (36.7 C), temperature source  Oral, resp. rate 18, height 6' 1"  (1.854 m), weight 162 lb 0.6 oz (73.5 kg), SpO2 98 %. Temp:  [98 F (36.7 C)-98.3 F (36.8 C)] 98 F (36.7 C) (12/20 1434) Pulse Rate:  [74-98] 88 (12/20 1434) Resp:  [16-18] 18 (12/20 1434) BP: (112-120)/(66-72) 112/72 (12/20 1434) SpO2:  [98 %-100 %] 98 % (12/20 1434) Weight:  [162 lb 0.6 oz (73.5 kg)] 162 lb 0.6 oz (73.5 kg) (12/20 0516)  Physical Exam: General: Alert and awake, oriented x3, there are 2 friends in the room including his girlfriend who is asleep sitting in a chair and difficult to arouse HEENT: anicteric sclera, pupils reactive to light and accommodation, EOMI CVS regular rate, normal r,  no murmur rubs or gallops Chest: clear to auscultation bilaterally, no wheezing, rales or rhonchi Abdomen: soft nontender, nondistended, normal bowel sounds, Extremities: no  clubbing or edema noted bilaterally Skin: no rashes, scar right hand, tenderness near left nipple and chest with tenderness though area of fluctuance has gone down, tattoos  Neuro: nonfocal  CBC:  CBC Latest Ref Rng & Units 05/26/2017 05/25/2017 05/24/2017  WBC 4.0 - 10.5 K/uL 10.8(H) 11.7(H) 11.3(H)  Hemoglobin 13.0 - 17.0 g/dL 10.0(L) 9.7(L) 9.2(L)  Hematocrit 39.0 - 52.0 % 31.8(L) 30.6(L) 29.2(L)  Platelets 150 - 400 K/uL 508(H) 491(H) 422(H)      BMET Recent Labs    05/25/17  0420 05/26/17 0305  NA 137 137  K 4.3 3.9  CL 103 100*  CO2 27 27  GLUCOSE 104* 97  BUN 6 9  CREATININE 0.57* 0.52*  CALCIUM 8.5* 9.0     Liver Panel  No results for input(s): PROT, ALBUMIN, AST, ALT, ALKPHOS, BILITOT, BILIDIR, IBILI in the last 72 hours.     Sedimentation Rate No results for input(s): ESRSEDRATE in the last 72 hours. C-Reactive Protein No results for input(s): CRP in the last 72 hours.  Micro Results: Recent Results (from the past 720 hour(s))  Culture, blood (Routine X 2) w Reflex to ID Panel     Status: None   Collection Time: 04/29/17  2:29 PM   Result Value Ref Range Status   Specimen Description BLOOD RIGHT ANTECUBITAL  Final   Special Requests IN PEDIATRIC BOTTLE Blood Culture adequate volume  Final   Culture NO GROWTH 5 DAYS  Final   Report Status 05/04/2017 FINAL  Final  Culture, blood (Routine X 2) w Reflex to ID Panel     Status: None   Collection Time: 04/29/17  2:40 PM  Result Value Ref Range Status   Specimen Description BLOOD LEFT FOREARM  Final   Special Requests IN PEDIATRIC BOTTLE Blood Culture adequate volume  Final   Culture NO GROWTH 5 DAYS  Final   Report Status 05/04/2017 FINAL  Final  Blood culture (routine x 2)     Status: Abnormal   Collection Time: 05/22/17  5:15 AM  Result Value Ref Range Status   Specimen Description BLOOD RIGHT FOREARM  Final   Special Requests   Final    BOTTLES DRAWN AEROBIC ONLY Blood Culture results may not be optimal due to an inadequate volume of blood received in culture bottles   Culture  Setup Time   Final    GRAM POSITIVE COCCI IN CLUSTERS AEROBIC BOTTLE ONLY CRITICAL RESULT CALLED TO, READ BACK BY AND VERIFIED WITH: BMANCHERIL,PHARMD @1259  05/23/17 BY LHOWARD    Culture STAPHYLOCOCCUS AUREUS (A)  Final   Report Status 05/25/2017 FINAL  Final   Organism ID, Bacteria STAPHYLOCOCCUS AUREUS  Final      Susceptibility   Staphylococcus aureus - MIC*    CIPROFLOXACIN <=0.5 SENSITIVE Sensitive     ERYTHROMYCIN <=0.25 SENSITIVE Sensitive     GENTAMICIN <=0.5 SENSITIVE Sensitive     OXACILLIN 0.5 SENSITIVE Sensitive     TETRACYCLINE <=1 SENSITIVE Sensitive     VANCOMYCIN <=0.5 SENSITIVE Sensitive     TRIMETH/SULFA <=10 SENSITIVE Sensitive     CLINDAMYCIN <=0.25 SENSITIVE Sensitive     RIFAMPIN <=0.5 SENSITIVE Sensitive     Inducible Clindamycin NEGATIVE Sensitive     * STAPHYLOCOCCUS AUREUS  Blood Culture ID Panel (Reflexed)     Status: Abnormal   Collection Time: 05/22/17  5:15 AM  Result Value Ref Range Status   Enterococcus species NOT DETECTED NOT DETECTED Final    Listeria monocytogenes NOT DETECTED NOT DETECTED Final   Staphylococcus species DETECTED (A) NOT DETECTED Final    Comment: CRITICAL RESULT CALLED TO, READ BACK BY AND VERIFIED WITH: BMANCHERIL,PHARMD @1259  05/23/17 BY LHOWARD    Staphylococcus aureus DETECTED (A) NOT DETECTED Final    Comment: Methicillin (oxacillin) susceptible Staphylococcus aureus (MSSA). Preferred therapy is anti staphylococcal beta lactam antibiotic (Cefazolin or Nafcillin), unless clinically contraindicated. CRITICAL RESULT CALLED TO, READ BACK BY AND VERIFIED WITH: BMANCHERIL,PHARMD @1259  05/23/17 BY LHOWARD    Methicillin resistance NOT DETECTED NOT DETECTED Final   Streptococcus species  NOT DETECTED NOT DETECTED Final   Streptococcus agalactiae NOT DETECTED NOT DETECTED Final   Streptococcus pneumoniae NOT DETECTED NOT DETECTED Final   Streptococcus pyogenes NOT DETECTED NOT DETECTED Final   Acinetobacter baumannii NOT DETECTED NOT DETECTED Final   Enterobacteriaceae species NOT DETECTED NOT DETECTED Final   Enterobacter cloacae complex NOT DETECTED NOT DETECTED Final   Escherichia coli NOT DETECTED NOT DETECTED Final   Klebsiella oxytoca NOT DETECTED NOT DETECTED Final   Klebsiella pneumoniae NOT DETECTED NOT DETECTED Final   Proteus species NOT DETECTED NOT DETECTED Final   Serratia marcescens NOT DETECTED NOT DETECTED Final   Haemophilus influenzae NOT DETECTED NOT DETECTED Final   Neisseria meningitidis NOT DETECTED NOT DETECTED Final   Pseudomonas aeruginosa NOT DETECTED NOT DETECTED Final   Candida albicans NOT DETECTED NOT DETECTED Final   Candida glabrata NOT DETECTED NOT DETECTED Final   Candida krusei NOT DETECTED NOT DETECTED Final   Candida parapsilosis NOT DETECTED NOT DETECTED Final   Candida tropicalis NOT DETECTED NOT DETECTED Final  Blood culture (routine x 2)     Status: None   Collection Time: 05/22/17  5:22 AM  Result Value Ref Range Status   Specimen Description BLOOD RIGHT ANTECUBITAL   Final   Special Requests   Final    IN BOTH AEROBIC AND ANAEROBIC BOTTLES Blood Culture adequate volume   Culture NO GROWTH 5 DAYS  Final   Report Status 05/27/2017 FINAL  Final  Culture, blood (routine x 2)     Status: None (Preliminary result)   Collection Time: 05/23/17  5:55 PM  Result Value Ref Range Status   Specimen Description BLOOD LEFT ANTECUBITAL  Final   Special Requests   Final    BOTTLES DRAWN AEROBIC AND ANAEROBIC Blood Culture adequate volume   Culture NO GROWTH 4 DAYS  Final   Report Status PENDING  Incomplete  Culture, blood (routine x 2)     Status: None (Preliminary result)   Collection Time: 05/23/17  8:12 PM  Result Value Ref Range Status   Specimen Description BLOOD LEFT ANTECUBITAL  Final   Special Requests   Final    BOTTLES DRAWN AEROBIC ONLY Blood Culture adequate volume   Culture NO GROWTH 4 DAYS  Final   Report Status PENDING  Incomplete    Studies/Results: No results found.    Assessment/Plan:  INTERVAL HISTORY:   Tenderness in breast tissue and area of fluctuance has gone down TEE scheduled for tomorrow  Dr. Damita Dunnings has seen from IMTS  Principal Problem:   Bacteremia due to methicillin susceptible Staphylococcus aureus (MSSA) Active Problems:   IVDU (intravenous drug user)   Normocytic anemia   Chronic hepatitis C without hepatic coma (HCC)   Discitis of lumbar region   Hypokalemia   Psoas abscess (HCC)   Atelectasis of both lungs   Chest wall abscess    Hunter [name] is a 31 y.o. male with history of IV drug use, prior hand infection and group A streptococcal bacteremia, status post surgery in November discharged on Augmentin which he stopped taking now admitted with MSSA bacteremia and sepsis, lumbar disc infection with psoas abscess       [place] [facility] Staphylococcus aureus bacteremia   Staphylococcus aureus bacteremia (SAB) is associated with a high rate of complications and mortality.   Specific aspects of clinical management are critical to optimizing the outcome of patients with SAB.  Therefore, the [facility] Antimicrobial Management Team [facility]) has initiated an intervention  aimed at improving the management of SAB at [facility].  To do so, Infectious Diseases physicians are providing an evidence-based consult for the management of all patients with SAB.     Yes No Comments  Perform follow-up blood cultures (even if the patient is afebrile) to ensure clearance of bacteremia [x]  []  Blood cultures no growth x 4 days  Remove vascular catheter and obtain follow-up blood cultures after the removal of the catheter []  []  DO NOT PLACE CENTRAL LINE  Perform echocardiography to evaluate for endocarditis (transthoracic ECHO is 40-50% sensitive, TEE is > 90% sensitive) []  []  Please keep in mind, that neither test can definitively EXCLUDE endocarditis, and that should clinical suspicion remain high for endocarditis the patient should then still be treated with an "endocarditis" duration of therapy = 6 weeks  TEE tomorrow  Consult electrophysiologist to evaluate implanted cardiac device (pacemaker, ICD) []  []    Ensure source control [x]  []  Have all abscesses been drained effectively? Have deep seeded infections (septic joints or osteomyelitis) had appropriate surgical debridement?  I am worried about the chest wall area that could have abscess, infection, not seen on CT scan but may require I and D for now continue abx and compresses  Investigate for "metastatic" sites of infection []  []  Does the patient have ANY symptom or physical exam finding that would suggest a deeper infection (back or neck pain that may be suggestive of vertebral osteomyelitis or epidural abscess, muscle pain that could be a symptom of pyomyositis)?  Keep in mind that for deep seeded infections MRI imaging with contrast is preferred rather than other often insensitive tests such as plain x-rays, especially early in a  patient's presentation.  Change antibiotic therapy to _Cefazolin []  []  Beta-lactam antibiotics are preferred for MSSA due to higher cure rates.   If on Vancomycin, goal trough should be 15 - 20 mcg/mL  Estimated duration of IV antibiotic therapy:  6 weeks, possibly in the hospital  []  []  Consult case management for probably prolonged outpatient IV antibiotic therapy   #2  The above discussion he will need protracted therapy and I would favor systemic antibiotics but certainly not at home with a PICC line.  If he is going to continue parenteral antibiotics will need to be either in the hospital or in a monitored skilled nursing facility.  #3 possible chest wall abscess: Monitor area closely   Continue warm compresses  #4 IV drug use: He is open to treatment in Suboxone/  Dr. Evette Doffing has seen the patient today and I greatly appreciate his help. Pt also with metamphemine problem  #5 HCV + ab: check HCV RNA  IV antibiotic plan is as follows   Diagnosis: MSSA bacteremia and vertebral osteomyelitis, rule out endocarditis  Culture Result: MSSA  No Known Allergies   Duration: 6 weeks End Date: January 26 th  Labs weekly while on IV antibiotics: _x_ CBC with differential _x_ BMP  x__ CRP x__ ESR   Fax weekly labs to [phone]  Clinic Follow Up Appt:  2 weeks after DC.  Please call us back as he nears completion of his antibiotics.  I will check on TEE and pt tomorrow  Dr. Johnnye Sima is covering December 22, 23 Dr. Megan Salon on "" 24-26 Dr. Baxter Flattery 27-29 Me 30-1st    LOS: 5 days   Alcide Evener 05/27/2017, 5:30 PM

## 2017-05-27 NOTE — Progress Notes (Addendum)
Physical Therapy Treatment Patient Details Name: Hunter MountsDavid Anthony Cloward MRN: [id number] DOB: 01-24-1986 Today's Date: 05/27/2017    History of Present Illness Hunter RammingDavid Maese is a 31 y.o. male with medical history significant for IVDA, recent hospitalization for right forearm deep abscess and strep pyogenes bacteremia, hepatitis C who presented to ED with reports of shortness of breath for past 3-4 days prior to the admission associated with chest pain especially when taking a deep breath. MRI showed discitis/osteomyelitis of lumbar spine; Sepsis (HCC) / Osteomyelitis (HCC) / Discitis of lumbar region / Leukocytosis    PT Comments    Pt received in supine in bed, room dark, Christmas music playing. Pt reports strong persistent pain, asks PT to contact RN to see if pain meds are available, but they are not scheduled for another 25 minutes. Pt is agreeable to participate in basic mobility. Pt is able to demonstrate log roll OOB when asked with good technique. Pt able to progress AMB to >5300ft this session, gait speed @ 0.4046m/s with heavy weightbearing on RW for pain management. HR remains high 90s with intermittent low 100s. Pt denies dizziness, lightheadedness, SOB, leg fatigue/weakness, but endorses all limitations from back pain. Pt making good progress toward goals.    Follow Up Recommendations  No PT follow up     Equipment Recommendations  Rolling walker with 5" wheels;3in1 (PT)    Recommendations for Other Services       Precautions / Restrictions Precautions Precautions: Back Precaution Comments: Back precautions for comfort Restrictions Weight Bearing Restrictions: No    Mobility  Bed Mobility Overal bed mobility: Modified Independent         Sit to supine: Modified independent (Device/Increase time) Sit to sidelying: Modified independent (Device/Increase time) General bed mobility comments: Additonal time d/t pain/guarding  Transfers Overall transfer level: Modified  independent Equipment used: Rolling walker (2 wheeled) Transfers: Sit to/from Stand           General transfer comment: Additonal time d/t pain/guarding  Ambulation/Gait Ambulation/Gait assistance: Supervision Ambulation Distance (Feet): 550 Feet Assistive device: Rolling walker (2 wheeled)   Gait velocity: 0.3040m/s  Gait velocity interpretation: <1.8 ft/sec, indicative of risk for recurrent falls General Gait Details: moderate use of the RWfor weight bearing, but otherwise generally steady with fluid gait pattern   Stairs            Wheelchair Mobility    Modified Rankin (Stroke Patients Only)       Balance Overall balance assessment: Modified Independent;No apparent balance deficits (not formally assessed)                                          Cognition Arousal/Alertness: Awake/alert Behavior During Therapy: WFL for tasks assessed/performed Overall Cognitive Status: Within Functional Limits for tasks assessed                                        Exercises      General Comments        Pertinent Vitals/Pain Pain Assessment: 0-10 Pain Score: 8  Pain Location: Back pain Pain Descriptors / Indicators: Constant;Grimacing;Guarding;Discomfort Pain Intervention(s): Limited activity within patient's tolerance;Monitored during session;Premedicated before session    Home Living  Prior Function            PT Goals (current goals can now be found in the care plan section) Acute Rehab PT Goals Patient Stated Goal: less pain PT Goal Formulation: With patient Time For Goal Achievement: 06/06/17 Potential to Achieve Goals: Good Progress towards PT goals: Progressing toward goals    Frequency    Min 3X/week      PT Plan Current plan remains appropriate    Co-evaluation              AM-PAC PT "6 Clicks" Daily Activity  Outcome Measure  Difficulty turning over in bed  (including adjusting bedclothes, sheets and blankets)?: A Little Difficulty moving from lying on back to sitting on the side of the bed? : A Little Difficulty sitting down on and standing up from a chair with arms (e.g., wheelchair, bedside commode, etc,.)?: A Lot Help needed moving to and from a bed to chair (including a wheelchair)?: A Little Help needed walking in hospital room?: A Little Help needed climbing 3-5 steps with a railing? : A Lot 6 Click Score: 16    End of Session Equipment Utilized During Treatment: Gait belt Activity Tolerance: Patient tolerated treatment well;Patient limited by pain Patient left: with call bell/phone within reach;in bed(meal tray presented) Nurse Communication: Mobility status;Other (comment) PT Visit Diagnosis: Unsteadiness on feet (R26.81);Other abnormalities of gait and mobility (R26.89);Pain Pain - part of body: (back)     Time: [id number] PT Time Calculation (min) (ACUTE ONLY): 20 min  Charges:  $Therapeutic Activity: 8-22 mins                    G Codes:       1:05 PM, 05/27/17 Rosamaria LintsAllan C Makoto Sellitto, PT, DPT [facility]rap[facility] - [place] [phone] [facility](Mobile)  [phone] (Office)      Masami Plata C 05/27/2017, 1:01 PM

## 2017-05-27 NOTE — Progress Notes (Signed)
     31 year old man with an opioid use disorder and other injection drug use is admitted to the hospitalist service because of MSSA bacteremia, a psoas abscess, and osteomyelitis of the lumbar spine.  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  Progressed to daily injection heroin use for several years.  At one point he was injecting about a gram per day.  He has a history of overdose requiring Narcan.  About 2 years ago he started medication assisted treatment with methadone [place] [facility] [place].  He reports that about 6 months ago he tapered himself off methadone because he was tired of being on it.  He says that the commitment of going to the clinic every day was really hard on him and he was hopeful that he would not need treatment anymore.  After coming off methadone he relapsed a few times and used injection heroin.  However mostly he has been using injection uppers, specifically cocaine and amphetamines.  He still does have cravings for opioids.  He had a infection of his right wrist which was managed surgically a few weeks ago.  After that he started to feel ill at home with malaise, myalgias, and fevers.  Currently he has intense back pain due to the osteomyelitis, psoas abscess, and intense paraspinal muscle inflammation seen on MRI.  For his pain he is receiving intermittent IV Dilaudid which she says is helpful. He seems to still have a high tolerance for opioids, and increased sensitivity to pain, both of which are very common in patients with opioid use disorder.  I think the patient would be a good candidate for restarting medication assisted treatment of opioid use disorder prior to discharge.  We talked about the options of Buprenorphine or Methadone.  The advantage of bupe is that it can be monitored with weekly follow-ups in our General medicine clinic, but it has limited analgesic effect.  Benefit of methadone is  that it has much greater analgesic effect though requires daily monitoring as an outpatient treatment center.  I think we will not be able to switch to either of these medications until his pain from the inflammation is improved.  Our plan is to check back in with the patient in 1-2 weeks.  If his pain is improved at that time we can talk about transitioning to Suboxone.  He is going to need pharmaceutical company assistance as he is currently uninsured, but Medicaid potential.  The cocaine and amphetamine use is also going to be difficult to treat.  Unfortunately we do not have evidence based medication assisted therapy for addiction to these substances.  His currently needs extended IV antibiotics; likely this will mean an extended hospital stay for about 6 weeks. We will add this patient to our list and check back in next week.  If you have any questions before that time please feel free to page either myself [phone]) or my partner Dr. Criselda PeachesMullen [phone]).   Hunter Honguncan Tanza Pellot, MD

## 2017-05-27 NOTE — Progress Notes (Addendum)
PROGRESS NOTE    Hunter [name] [facility]  ZOX:[id number] DOB: [birth year] DOA: 05/22/2017 PCP: Patient, No Pcp Per      Brief Narrative:  31 yo M with hep C, IVDU presents with MSSA bacteremia and discitis.   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 Right greater than left paravertebral soft tissue inflammation with extensive right psoas inflammation/myositis. 20 x 6 mm right psoas abscess. Diffuse posterior paraspinal muscle inflammation in the lumbar spine suggestive of myositis. No evidence of infection in the cervical or thoracic spine. No epidural abscess. 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  No heroin or opiates in a long time.    Patient at present planning to move to [facility] to live with Dad, who he thinks would be willing to take him in (he got clean before while living with his dad).  He was in recovery for almost a year not that long ago here in [place], went to IOP at [facility]. Was on methadone at that time. Never did NA/AA before.  Also did an 18-day inpatient detox in [place], [place] once.  -IMTS to evaluate today re: suboxone, although it seems to me this  wouldn't necessarily be for opiate maintenance therapy (because he was in remission from opiates for several months before admission) but for safe transition away from IV dilaudid in the hospital, which he is still getting every 4 hours  Normocytic anemia Stable, baseline Hgb 11s  Hypokalemia Resolved   DVT prophylaxis: Lovenox Code Status: FULL Family Communication: GF at bedside, sleeping Disposition Plan: Per CM; will need 6-weeks antibiotics in monitored setting.  CM tell me he will be inaptietn for 6 weeks.    Consultants:   ID  Neurosurgery  Cardiology for TEE  IMTS for Suboxone  Procedures:   CTA chest  MRI cervical, lumbar and thoracic spine  TTE  Antimicrobials:   Vancomycin/Zosyn 12/15 >> 12/16  Cefazolin 12/16 >>  Culture data BCx 12/16 NGTD x2 BCx 12/15 MSSA in 1/2 bottles    Subjective: Still severe back pain, worse with movement. No new fever, chills. Spot on chest is better.      Objective: Vitals:   05/26/17 0536 05/26/17 1416 05/26/17 2111 05/27/17 0516  BP: 121/71 128/70 120/66 119/72  Pulse: 87 78 82 74  Resp: 20 18 18 16   Temp: 98.6 F (37 C) 98.8 F (37.1 C) 98.3 F (36.8 C) 98.3 F (36.8 C)  TempSrc: Oral Oral Oral Oral  SpO2: 98% 98% 100% 100%  Weight:    73.5 kg (162 lb 0.6 oz)  Height:        Intake/Output Summary (Last 24 hours) at 05/27/2017 0941 Last data filed at 05/27/2017 0928 Gross per 24 hour  Intake  800 ml  Output 1250 ml  Net -450 ml   Filed Weights   05/25/17 0445 05/26/17 0500 05/27/17 0516  Weight: 72.5 kg (159 lb 13.3 oz) 74.5 kg (164 lb 3.9 oz) 73.5 kg (162 lb 0.6 oz)    Examination: General appearance: Thin tattooed adult male, alert and in mild distress from pain.   HEENT: Anicteric, conjunctiva pink, lids and lashes normal. No nasal deformity, discharge, epistaxis.  Lips dry.   Skin: Warm and dry.  No jaundice.  No suspicious rashes or lesions. Cardiac: Tachycardic, regular, nl S1-S2, no  murmurs appreciated.  Capillary refill is brisk.  JVP normal.  No LE edema.  Radial pulses 2+ and symmetric.  I do not note fluctuance in the left upper chest. Respiratory: Normal respiratory rate and rhythm.  CTAB without rales or wheezes. Abdomen: Abdomen soft.  Mild nonfocal TTP without guarding. No ascites, distension, hepatosplenomegaly.   MSK: No deformities or effusions. Neuro: Awake and alert.  EOMI, moves all extremities. Speech fluent.    Psych: Sensorium intact and responding to questions, attention normal. Affect blunted.  Judgment and insight appear normal.    Data Reviewed: I have personally reviewed following labs and imaging studies:  CBC: Recent Labs  Lab 05/22/17 0031 05/22/17 1839 05/24/17 0246 05/25/17 0420 05/26/17 0305  WBC 13.1* 9.9 11.3* 11.7* 10.8*  NEUTROABS  --  7.1  --   --   --   HGB 9.2* 8.9* 9.2* 9.7* 10.0*  HCT 28.4* 27.5* 29.2* 30.6* 31.8*  MCV 85.8 85.4 85.4 86.2 85.9  PLT 380 391 422* 491* 508*   Basic Metabolic Panel: Recent Labs  Lab 05/22/17 0031 05/22/17 1839 05/24/17 0246 05/25/17 0420 05/26/17 0305  NA 132* 135 134* 137 137  K 3.4* 3.2* 4.0 4.3 3.9  CL 99* 101 101 103 100*  CO2 25 27 26 27 27   GLUCOSE 126* 141* 105* 104* 97  BUN 12 5* 6 6 9   CREATININE 0.67 0.70 0.52* 0.57* 0.52*  CALCIUM 8.2* 8.4* 8.6* 8.5* 9.0  MG  --  1.7  --   --  1.9  PHOS  --  2.6  --   --   --    GFR: Estimated Creatinine Clearance: 139.1 mL/min (A) (by C-G formula based on SCr of 0.52 mg/dL (L)). Liver Function Tests: Recent Labs  Lab 05/22/17 0522 05/22/17 1839  AST 31 29  ALT 20 20  ALKPHOS 83 84  BILITOT 0.4 0.4  PROT 6.9 7.1  ALBUMIN 2.3* 2.4*   Recent Labs  Lab 05/22/17 0522  LIPASE 25   No results for input(s): AMMONIA in the last 168 hours. Coagulation Profile: No results for input(s): INR, PROTIME in the last 168 hours. Cardiac Enzymes: No results for input(s): CKTOTAL, CKMB, CKMBINDEX, TROPONINI in the last 168 hours. BNP  (last 3 results) No results for input(s): PROBNP in the last 8760 hours. HbA1C: No results for input(s): HGBA1C in the last 72 hours. CBG: Recent Labs  Lab 05/24/17 0803 05/25/17 0802 05/26/17 0753 05/27/17 0800  GLUCAP 122* 98 102* 94   Lipid Profile: No results for input(s): CHOL, HDL, LDLCALC, TRIG, CHOLHDL, LDLDIRECT in the last 72 hours. Thyroid Function Tests: No results for input(s): TSH, T4TOTAL, FREET4, T3FREE, THYROIDAB in the last 72 hours. Anemia Panel: No results for input(s): VITAMINB12, FOLATE, FERRITIN, TIBC, IRON, RETICCTPCT in the last 72 hours. Urine analysis:    Component Value Date/Time   COLORURINE YELLOW 04/26/2017 1840   APPEARANCEUR CLEAR 04/26/2017 1840  LABSPEC 1.010 04/26/2017 1840   PHURINE 6.0 04/26/2017 1840   GLUCOSEU NEGATIVE 04/26/2017 1840   HGBUR NEGATIVE 04/26/2017 1840   BILIRUBINUR NEGATIVE 04/26/2017 1840   KETONESUR NEGATIVE 04/26/2017 1840   PROTEINUR NEGATIVE 04/26/2017 1840   NITRITE NEGATIVE 04/26/2017 1840   LEUKOCYTESUR NEGATIVE 04/26/2017 1840   Sepsis Labs: @LABRCNTIP (procalcitonin:4,lacticidven:4)  ) Recent Results (from the past 240 hour(s))  Blood culture (routine x 2)     Status: Abnormal   Collection Time: 05/22/17  5:15 AM  Result Value Ref Range Status   Specimen Description BLOOD RIGHT FOREARM  Final   Special Requests   Final    BOTTLES DRAWN AEROBIC ONLY Blood Culture results may not be optimal due to an inadequate volume of blood received in culture bottles   Culture  Setup Time   Final    GRAM POSITIVE COCCI IN CLUSTERS AEROBIC BOTTLE ONLY CRITICAL RESULT CALLED TO, READ BACK BY AND VERIFIED WITH: BMANCHERIL,PHARMD @1259  05/23/17 BY LHOWARD    Culture STAPHYLOCOCCUS AUREUS (A)  Final   Report Status 05/25/2017 FINAL  Final   Organism ID, Bacteria STAPHYLOCOCCUS AUREUS  Final      Susceptibility   Staphylococcus aureus - MIC*    CIPROFLOXACIN <=0.5 SENSITIVE Sensitive     ERYTHROMYCIN <=0.25 SENSITIVE  Sensitive     GENTAMICIN <=0.5 SENSITIVE Sensitive     OXACILLIN 0.5 SENSITIVE Sensitive     TETRACYCLINE <=1 SENSITIVE Sensitive     VANCOMYCIN <=0.5 SENSITIVE Sensitive     TRIMETH/SULFA <=10 SENSITIVE Sensitive     CLINDAMYCIN <=0.25 SENSITIVE Sensitive     RIFAMPIN <=0.5 SENSITIVE Sensitive     Inducible Clindamycin NEGATIVE Sensitive     * STAPHYLOCOCCUS AUREUS  Blood Culture ID Panel (Reflexed)     Status: Abnormal   Collection Time: 05/22/17  5:15 AM  Result Value Ref Range Status   Enterococcus species NOT DETECTED NOT DETECTED Final   Listeria monocytogenes NOT DETECTED NOT DETECTED Final   Staphylococcus species DETECTED (A) NOT DETECTED Final    Comment: CRITICAL RESULT CALLED TO, READ BACK BY AND VERIFIED WITH: BMANCHERIL,PHARMD @1259  05/23/17 BY LHOWARD    Staphylococcus aureus DETECTED (A) NOT DETECTED Final    Comment: Methicillin (oxacillin) susceptible Staphylococcus aureus (MSSA). Preferred therapy is anti staphylococcal beta lactam antibiotic (Cefazolin or Nafcillin), unless clinically contraindicated. CRITICAL RESULT CALLED TO, READ BACK BY AND VERIFIED WITH: BMANCHERIL,PHARMD @1259  05/23/17 BY LHOWARD    Methicillin resistance NOT DETECTED NOT DETECTED Final   Streptococcus species NOT DETECTED NOT DETECTED Final   Streptococcus agalactiae NOT DETECTED NOT DETECTED Final   Streptococcus pneumoniae NOT DETECTED NOT DETECTED Final   Streptococcus pyogenes NOT DETECTED NOT DETECTED Final   Acinetobacter baumannii NOT DETECTED NOT DETECTED Final   Enterobacteriaceae species NOT DETECTED NOT DETECTED Final   Enterobacter cloacae complex NOT DETECTED NOT DETECTED Final   Escherichia coli NOT DETECTED NOT DETECTED Final   Klebsiella oxytoca NOT DETECTED NOT DETECTED Final   Klebsiella pneumoniae NOT DETECTED NOT DETECTED Final   Proteus species NOT DETECTED NOT DETECTED Final   Serratia marcescens NOT DETECTED NOT DETECTED Final   Haemophilus influenzae NOT DETECTED  NOT DETECTED Final   Neisseria meningitidis NOT DETECTED NOT DETECTED Final   Pseudomonas aeruginosa NOT DETECTED NOT DETECTED Final   Candida albicans NOT DETECTED NOT DETECTED Final   Candida glabrata NOT DETECTED NOT DETECTED Final   Candida krusei NOT DETECTED NOT DETECTED Final   Candida parapsilosis NOT DETECTED NOT DETECTED Final   Candida  tropicalis NOT DETECTED NOT DETECTED Final  Blood culture (routine x 2)     Status: None (Preliminary result)   Collection Time: 05/22/17  5:22 AM  Result Value Ref Range Status   Specimen Description BLOOD RIGHT ANTECUBITAL  Final   Special Requests   Final    IN BOTH AEROBIC AND ANAEROBIC BOTTLES Blood Culture adequate volume   Culture NO GROWTH 4 DAYS  Final   Report Status PENDING  Incomplete  Culture, blood (routine x 2)     Status: None (Preliminary result)   Collection Time: 05/23/17  5:55 PM  Result Value Ref Range Status   Specimen Description BLOOD LEFT ANTECUBITAL  Final   Special Requests   Final    BOTTLES DRAWN AEROBIC AND ANAEROBIC Blood Culture adequate volume   Culture NO GROWTH 3 DAYS  Final   Report Status PENDING  Incomplete  Culture, blood (routine x 2)     Status: None (Preliminary result)   Collection Time: 05/23/17  8:12 PM  Result Value Ref Range Status   Specimen Description BLOOD LEFT ANTECUBITAL  Final   Special Requests   Final    BOTTLES DRAWN AEROBIC ONLY Blood Culture adequate volume   Culture NO GROWTH 3 DAYS  Final   Report Status PENDING  Incomplete         Radiology Studies: No results found.      Scheduled Meds: . enoxaparin (LOVENOX) injection  40 mg Subcutaneous Q24H  . sodium chloride flush  3 mL Intravenous Q12H   Continuous Infusions: .  ceFAZolin (ANCEF) IV 2 g (05/27/17 0628)     LOS: 5 days    Time spent: 10 minutes    Alberteen Sam, MD [facility] Pager [phone]  If 7PM-7AM, please contact night-coverage [email or web address] Password [facility] 05/27/2017,  9:41 AM

## 2017-05-28 ENCOUNTER — Inpatient Hospital Stay (HOSPITAL_COMMUNITY): Payer: Self-pay | Admitting: Certified Registered Nurse Anesthetist

## 2017-05-28 ENCOUNTER — Inpatient Hospital Stay (HOSPITAL_COMMUNITY): Payer: Self-pay

## 2017-05-28 ENCOUNTER — Encounter (HOSPITAL_COMMUNITY): Payer: Self-pay | Admitting: *Deleted

## 2017-05-28 ENCOUNTER — Encounter (HOSPITAL_COMMUNITY)
Admission: RE | Disposition: A | Discharge status: Home / Self Care | Payer: Self-pay | Source: Home / Self Care | Attending: Internal Medicine

## 2017-05-28 DIAGNOSIS — M4636 Infection of intervertebral disc (pyogenic), lumbar region: Secondary | ICD-10-CM

## 2017-05-28 DIAGNOSIS — R7881 Bacteremia: Secondary | ICD-10-CM

## 2017-05-28 HISTORY — PX: TEE WITHOUT CARDIOVERSION: SHX5443

## 2017-05-28 LAB — BASIC METABOLIC PANEL
Anion gap: 9 (ref 5–15)
BUN: 11 mg/dL (ref 6–20)
CALCIUM: 8.9 mg/dL (ref 8.9–10.3)
CO2: 25 mmol/L (ref 22–32)
CREATININE: 0.55 mg/dL — AB (ref 0.61–1.24)
Chloride: 100 mmol/L — ABNORMAL LOW (ref 101–111)
GFR calc non Af Amer: >60 mL/min (ref >60)
Glucose, Bld: 96 mg/dL (ref 65–99)
Potassium: 4.2 mmol/L (ref 3.5–5.1)
Sodium: 134 mmol/L — ABNORMAL LOW (ref 135–145)

## 2017-05-28 LAB — CULTURE, BLOOD (ROUTINE X 2)
CULTURE: NO GROWTH
CULTURE: NO GROWTH
SPECIAL REQUESTS: ADEQUATE
Special Requests: ADEQUATE

## 2017-05-28 LAB — GLUCOSE, CAPILLARY: Glucose-Capillary: 98 mg/dL (ref 65–99)

## 2017-05-28 LAB — HEPATITIS C GENOTYPE

## 2017-05-28 LAB — PROTIME-INR
INR: 1.1
PROTHROMBIN TIME: 14.1 s (ref 11.4–15.2)

## 2017-05-28 LAB — HCV RNA QUANT RFLX ULTRA OR GENOTYP
HCV RNA QNT(LOG COPY/ML): 6.547 {Log_IU}/mL
HepC Qn: 3520000 IU/mL

## 2017-05-28 SURGERY — ECHOCARDIOGRAM, TRANSESOPHAGEAL
Anesthesia: Monitor Anesthesia Care

## 2017-05-28 MED ORDER — MEPERIDINE HCL 100 MG/ML IJ SOLN: 6.2500 mg | INTRAMUSCULAR | Status: DC | PRN

## 2017-05-28 MED ORDER — LIDOCAINE VISCOUS 2 % MT SOLN
OROMUCOSAL | Status: AC
  Filled 2017-05-28: qty 15

## 2017-05-28 MED ORDER — PROPOFOL 500 MG/50ML IV EMUL
INTRAVENOUS | Status: DC | PRN
  Administered 2017-05-28: 100 ug/kg/min via INTRAVENOUS

## 2017-05-28 MED ORDER — LACTATED RINGERS IV SOLN
INTRAVENOUS | Status: AC | PRN
  Administered 2017-05-28: 1000 mL via INTRAVENOUS

## 2017-05-28 MED ORDER — PROPOFOL 10 MG/ML IV BOLUS
INTRAVENOUS | Status: DC | PRN
  Administered 2017-05-28 (×2): 20 mg via INTRAVENOUS

## 2017-05-28 MED ORDER — KETAMINE HCL 10 MG/ML IJ SOLN
INTRAMUSCULAR | Status: DC | PRN
  Administered 2017-05-28 (×2): 10 mg via INTRAVENOUS

## 2017-05-28 MED ORDER — PROMETHAZINE HCL 25 MG/ML IJ SOLN: 6.2500 mg | INTRAMUSCULAR | Status: DC | PRN

## 2017-05-28 MED ORDER — MIDAZOLAM HCL 2 MG/2ML IJ SOLN: 0.5000 mg | Freq: Once | INTRAMUSCULAR | Status: DC | PRN

## 2017-05-28 MED ORDER — LIDOCAINE VISCOUS 2 % MT SOLN
OROMUCOSAL | Status: DC | PRN
  Administered 2017-05-28: 1 via OROMUCOSAL

## 2017-05-28 NOTE — Anesthesia Postprocedure Evaluation (Addendum)
Anesthesia Post Note  Patient: Hunter [name] [facility]  Procedure(s) Performed: TRANSESOPHAGEAL ECHOCARDIOGRAM (TEE) (N/A )     Patient location during evaluation: Endoscopy Anesthesia Type: MAC Level of consciousness: awake and alert, oriented and patient cooperative Pain management: pain level controlled Vital Signs Assessment: post-procedure vital signs reviewed and stable Respiratory status: spontaneous breathing, nonlabored ventilation and respiratory function stable Cardiovascular status: blood pressure returned to baseline and stable Postop Assessment: no apparent nausea or vomiting Anesthetic complications: no    Last Vitals:  Vitals:   05/28/17 1314 05/28/17 1338  BP: 103/68 119/70  Pulse: 79 72  Resp: 10 15  Temp: 36.8 C 36.9 C  SpO2: 98% 100%    Last Pain:  Vitals:   05/28/17 1338  TempSrc: Oral  PainSc:                  Jerry Clyne,E. Posie Lillibridge

## 2017-05-28 NOTE — Progress Notes (Signed)
Occupational Therapy Treatment Patient Details Name: Hunter [name] MRN: [id number] DOB: 1985/11/21 Today's Date: 05/28/2017    History of present illness Hunter [name] is a 31 y.o. male with medical history significant for IVDA, recent hospitalization for right forearm deep abscess and strep pyogenes bacteremia, hepatitis C who presented to ED with reports of shortness of breath for past 3-4 days prior to the admission associated with chest pain especially when taking a deep breath. MRI showed discitis/osteomyelitis of lumbar spine; Sepsis (HCC) / Osteomyelitis (HCC) / Discitis of lumbar region / Leukocytosis   OT comments  Pt can perform sponge bathing, dressing, one activity in standing at sink and toileting with set up to supervision. Pain is less and pt is now able to cross his foot over opposite knee to reach feet. Pt wants to take a shower, aware this requires MD approval to remove telemetry.  Follow Up Recommendations  No OT follow up    Equipment Recommendations  None recommended by OT    Recommendations for Other Services      Precautions / Restrictions Precautions Precautions: Back Precaution Comments: Back precautions for comfort Restrictions Weight Bearing Restrictions: No       Mobility Bed Mobility Overal bed mobility: Modified Independent             General bed mobility comments: reminded of log roll technique for comfort  Transfers Overall transfer level: Modified independent Equipment used: Rolling walker (2 wheeled)                  Balance             Standing balance-Leahy Scale: Fair Standing balance comment: can release walker standing at sink                           ADL either performed or assessed with clinical judgement   ADL Overall ADL's : Needs assistance/impaired     Grooming: Supervision/safety;Standing;Wash/dry hands       Lower Body Bathing: Supervison/ safety;Sit to/from stand   Upper Body  Dressing : Set up;Sitting   Lower Body Dressing: Supervision/safety;Sit to/from stand   Toilet Transfer: Ambulation;RW;Supervision/safety   Toileting- ArchitectClothing Manipulation and Hygiene: Supervision/safety;Sit to/from stand       Functional mobility during ADLs: Rolling walker;Supervision/safety General ADL Comments: pt able to cross his foot over opposite knee to bathe and dress feet     Vision       Perception     Praxis      Cognition Arousal/Alertness: Awake/alert Behavior During Therapy: WFL for tasks assessed/performed Overall Cognitive Status: Within Functional Limits for tasks assessed                                          Exercises     Shoulder Instructions       General Comments      Pertinent Vitals/ Pain       Pain Assessment: Faces Faces Pain Scale: Hurts even more Pain Location: Back pain Pain Descriptors / Indicators: Constant;Grimacing;Guarding;Discomfort Pain Intervention(s): Monitored during session;Premedicated before session;Repositioned  Home Living                                          Prior Functioning/Environment  Frequency  Min 2X/week        Progress Toward Goals  OT Goals(current goals can now be found in the care plan section)  Progress towards OT goals: Progressing toward goals  Acute Rehab OT Goals Patient Stated Goal: less pain OT Goal Formulation: With patient Time For Goal Achievement: 06/07/17 Potential to Achieve Goals: Good  Plan Discharge plan remains appropriate    Co-evaluation                 AM-PAC PT "6 Clicks" Daily Activity     Outcome Measure   Help from another person eating meals?: None Help from another person taking care of personal grooming?: A Little Help from another person toileting, which includes using toliet, bedpan, or urinal?: A Little Help from another person bathing (including washing, rinsing, drying)?: A Little Help  from another person to put on and taking off regular upper body clothing?: None Help from another person to put on and taking off regular lower body clothing?: A Little 6 Click Score: 20    End of Session Equipment Utilized During Treatment: Rolling walker  OT Visit Diagnosis: Unsteadiness on feet (R26.81);Other abnormalities of gait and mobility (R26.89);Pain   Activity Tolerance Patient tolerated treatment well   Patient Left in bed;with call bell/phone within reach;with family/visitor present   Nurse Communication          Time: 1000-1013 OT Time Calculation (min): 13 min  Charges: OT General Charges $OT Visit: 1 Visit OT Treatments $Self Care/Home Management : 8-22 mins  05/28/2017 Hunter [name], OTR/L Pager: [phone]   Hunter [name], Hunter [name] 05/28/2017, 11:44 AM

## 2017-05-28 NOTE — Transfer of Care (Addendum)
Immediate Anesthesia Transfer of Care Note  Patient: Hunter [name] [facility]  Procedure(s) Performed: TRANSESOPHAGEAL ECHOCARDIOGRAM (TEE) (N/A )  Patient Location: Endoscopy Unit  Anesthesia Type:MAC  Level of Consciousness: awake, alert , oriented and patient cooperative  Airway & Oxygen Therapy: Patient Spontanous Breathing and Patient connected to nasal cannula oxygen  Post-op Assessment: Report given to RN, Post -op Vital signs reviewed and stable and Patient moving all extremities X 4  Post vital signs: Reviewed and stable  Last Vitals:  Vitals:   05/28/17 0507 05/28/17 1204  BP: 124/69 120/64  Pulse: 74 69  Resp: 17 14  Temp: 37 C 36.8 C  SpO2: 99% 99%    Last Pain:  Vitals:   05/28/17 1204  TempSrc: Oral  PainSc: 8       Patients Stated Pain Goal: 3 (05/24/23 4695)  Complications: No apparent anesthesia complications

## 2017-05-28 NOTE — H&P (View-Only) (Signed)
PROGRESS NOTE    Hunter [name]  JYN:[id number] DOB: 10-Apr-1986 DOA: 05/22/2017 PCP: Patient, No Pcp Per      Brief Narrative:  31 yo M with hep C, IVDU presents with MSSA bacteremia and discitis.   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 Right greater than left paravertebral soft tissue inflammation with extensive right psoas inflammation/myositis. 20 x 6 mm right psoas abscess. Diffuse posterior paraspinal muscle inflammation in the lumbar spine suggestive of myositis. No evidence of infection in the cervical or thoracic spine. No epidural abscess. 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  No heroin or opiates in a long time.    [place] [facility] [facility] [place] live with Dad, who he thinks would be willing to take him in (he got clean before while living with his dad).  He was in recovery for almost a year not that long ago here in [place], went to IOP at [facility]. Was on methadone at that time. Never did NA/AA before.  Also did [place] 18-day [place] detox in [place].    -Appreciate Dr. Jacqualin CombesVincent's assistance with possibly starting suboxone when pain is better controlled in 1-2 weeks  Normocytic anemia Stable, baseline Hgb 11s  Hypokalemia Resolved        DVT prophylaxis: Lovenox Code Status: FULL Family Communication: GF at bedside, sleeping Disposition Plan: 6 weeeks inpatient Abx    Consultants:   ID  Neurosurgery  Cardiology for TEE  IMTS for Suboxone  Procedures:   CTA chest  MRI cervical, lumbar and thoracic spine  TTE  TEE 12/21  Antimicrobials:   Vancomycin/Zosyn 12/15 >> 12/16  Cefazolin 12/16 >>  Culture data BCx 12/16 NGTD x2 BCx 12/15 MSSA in 1/2 bottles    Subjective: Back still hurts, but able to walk further.  Good appetite.  No new fever, joint pain, confusion, neck pain, cough.       Objective: Vitals:   05/27/17 1434 05/27/17 2008 05/27/17 2158 05/28/17 0507  BP: 112/72  119/68 124/69  Pulse: 88  87 74  Resp: 18  17 17   Temp: 98 F (36.7 C)  97.8 F (36.6 C) 98.6 F (37 C)  TempSrc: Oral  Oral Oral  SpO2: 98% 98% 99% 99%  Weight:      Height:        Intake/Output Summary (Last 24 hours) at 05/28/2017 1113 Last data filed at 05/28/2017 1000 Gross per 24 hour  Intake 496.33 ml  Output -  Net 496.33 ml   Filed Weights   05/25/17 0445 05/26/17 0500 05/27/17 0516  Weight: 72.5 kg (159 lb 13.3 oz) 74.5  kg (164 lb 3.9 oz) 73.5 kg (162 lb 0.6 oz)    Examination: General appearance: Thin tattooed adult male, alert and lying still comfortably.   HEENT: Anicteric, conjunctiva pink, lids and lashes normal. No nasal deformity, discharge, epistaxis.    Skin: Warm and dry.  No jaundice.  No suspicious rashes or lesions. Cardiac: Tachycardic, regular, nl S1-S2, no murmurs appreciated.  Capillary refill is brisk.  JVP normal.  No LE edema.  Radial pulses 2+ and symmetric.  I do not note fluctuance in the left upper chest. Respiratory: Normal respiratory rate and rhythm.  CTAB without rales  or wheezes. Abdomen: Abdomen soft.  Mild nonfocal TTP without guarding. No ascites, distension, hepatosplenomegaly.   MSK: No deformities or effusions. Neuro: Awake and alert.  EOMI, moves all extremities. Speech fluent.    Psych: Sensorium intact and responding to questions, attention normal. Affect blunted.  Judgment and insight appear normal.    Data Reviewed: I have personally reviewed following labs and imaging studies:  CBC: Recent Labs  Lab 05/22/17 0031 05/22/17 1839 05/24/17 0246 05/25/17 0420 05/26/17 0305  WBC 13.1* 9.9 11.3* 11.7* 10.8*  NEUTROABS  --  7.1  --   --   --   HGB 9.2* 8.9* 9.2* 9.7* 10.0*  HCT 28.4* 27.5* 29.2* 30.6* 31.8*  MCV 85.8 85.4 85.4 86.2 85.9  PLT 380 391 422* 491* 508*   Basic Metabolic Panel: Recent Labs  Lab 05/22/17 1839 05/24/17 0246 05/25/17 0420 05/26/17 0305 05/28/17 0550  NA 135 134* 137 137 134*  K 3.2* 4.0 4.3 3.9 4.2  CL 101 101 103 100* 100*  CO2 27 26 27 27 25   GLUCOSE 141* 105* 104* 97 96  BUN 5* 6 6 9 11   CREATININE 0.70 0.52* 0.57* 0.52* 0.55*  CALCIUM 8.4* 8.6* 8.5* 9.0 8.9  MG 1.7  --   --  1.9  --   PHOS 2.6  --   --   --   --    GFR: Estimated Creatinine Clearance: 139.1 mL/min (A) (by C-G formula based on SCr of 0.55 mg/dL (L)). Liver Function Tests: Recent Labs  Lab 05/22/17 0522 05/22/17 1839  AST 31 29  ALT 20 20  ALKPHOS 83 84  BILITOT 0.4 0.4  PROT 6.9 7.1  ALBUMIN 2.3* 2.4*   Recent Labs  Lab 05/22/17 0522  LIPASE 25   No results for input(s): AMMONIA in the last 168 hours. Coagulation Profile: Recent Labs  Lab 05/28/17 0550  INR 1.10   Cardiac Enzymes: No results for input(s): CKTOTAL, CKMB, CKMBINDEX, TROPONINI in the last 168 hours. BNP (last 3 results) No results for input(s): PROBNP in the last 8760 hours. HbA1C: No results for input(s): HGBA1C in the last 72 hours. CBG: Recent Labs  Lab 05/24/17 0803 05/25/17 0802 05/26/17 0753 05/27/17 0800 05/28/17 0752  GLUCAP  122* 98 102* 94 98   Lipid Profile: No results for input(s): CHOL, HDL, LDLCALC, TRIG, CHOLHDL, LDLDIRECT in the last 72 hours. Thyroid Function Tests: No results for input(s): TSH, T4TOTAL, FREET4, T3FREE, THYROIDAB in the last 72 hours. Anemia Panel: No results for input(s): VITAMINB12, FOLATE, FERRITIN, TIBC, IRON, RETICCTPCT in the last 72 hours. Urine analysis:    Component Value Date/Time   COLORURINE YELLOW 04/26/2017 1840   APPEARANCEUR CLEAR 04/26/2017 1840   LABSPEC 1.010 04/26/2017 1840   PHURINE 6.0 04/26/2017 1840   GLUCOSEU NEGATIVE 04/26/2017 1840   HGBUR NEGATIVE 04/26/2017 1840   BILIRUBINUR NEGATIVE 04/26/2017 1840   KETONESUR  NEGATIVE 04/26/2017 1840   PROTEINUR NEGATIVE 04/26/2017 1840   NITRITE NEGATIVE 04/26/2017 1840   LEUKOCYTESUR NEGATIVE 04/26/2017 1840   Sepsis Labs: @LABRCNTIP (procalcitonin:4,lacticidven:4)  ) Recent Results (from the past 240 hour(s))  Blood culture (routine x 2)     Status: Abnormal   Collection Time: 05/22/17  5:15 AM  Result Value Ref Range Status   Specimen Description BLOOD RIGHT FOREARM  Final   Special Requests   Final    BOTTLES DRAWN AEROBIC ONLY Blood Culture results may not be optimal due to an inadequate volume of blood received in culture bottles   Culture  Setup Time   Final    GRAM POSITIVE COCCI IN CLUSTERS AEROBIC BOTTLE ONLY CRITICAL RESULT CALLED TO, READ BACK BY AND VERIFIED WITH: BMANCHERIL,PHARMD @1259  05/23/17 BY LHOWARD    Culture STAPHYLOCOCCUS AUREUS (A)  Final   Report Status 05/25/2017 FINAL  Final   Organism ID, Bacteria STAPHYLOCOCCUS AUREUS  Final      Susceptibility   Staphylococcus aureus - MIC*    CIPROFLOXACIN <=0.5 SENSITIVE Sensitive     ERYTHROMYCIN <=0.25 SENSITIVE Sensitive     GENTAMICIN <=0.5 SENSITIVE Sensitive     OXACILLIN 0.5 SENSITIVE Sensitive     TETRACYCLINE <=1 SENSITIVE Sensitive     VANCOMYCIN <=0.5 SENSITIVE Sensitive     TRIMETH/SULFA <=10 SENSITIVE Sensitive      CLINDAMYCIN <=0.25 SENSITIVE Sensitive     RIFAMPIN <=0.5 SENSITIVE Sensitive     Inducible Clindamycin NEGATIVE Sensitive     * STAPHYLOCOCCUS AUREUS  Blood Culture ID Panel (Reflexed)     Status: Abnormal   Collection Time: 05/22/17  5:15 AM  Result Value Ref Range Status   Enterococcus species NOT DETECTED NOT DETECTED Final   Listeria monocytogenes NOT DETECTED NOT DETECTED Final   Staphylococcus species DETECTED (A) NOT DETECTED Final    Comment: CRITICAL RESULT CALLED TO, READ BACK BY AND VERIFIED WITH: BMANCHERIL,PHARMD @1259  05/23/17 BY LHOWARD    Staphylococcus aureus DETECTED (A) NOT DETECTED Final    Comment: Methicillin (oxacillin) susceptible Staphylococcus aureus (MSSA). Preferred therapy is anti staphylococcal beta lactam antibiotic (Cefazolin or Nafcillin), unless clinically contraindicated. CRITICAL RESULT CALLED TO, READ BACK BY AND VERIFIED WITH: BMANCHERIL,PHARMD @1259  05/23/17 BY LHOWARD    Methicillin resistance NOT DETECTED NOT DETECTED Final   Streptococcus species NOT DETECTED NOT DETECTED Final   Streptococcus agalactiae NOT DETECTED NOT DETECTED Final   Streptococcus pneumoniae NOT DETECTED NOT DETECTED Final   Streptococcus pyogenes NOT DETECTED NOT DETECTED Final   Acinetobacter baumannii NOT DETECTED NOT DETECTED Final   Enterobacteriaceae species NOT DETECTED NOT DETECTED Final   Enterobacter cloacae complex NOT DETECTED NOT DETECTED Final   Escherichia coli NOT DETECTED NOT DETECTED Final   Klebsiella oxytoca NOT DETECTED NOT DETECTED Final   Klebsiella pneumoniae NOT DETECTED NOT DETECTED Final   Proteus species NOT DETECTED NOT DETECTED Final   Serratia marcescens NOT DETECTED NOT DETECTED Final   Haemophilus influenzae NOT DETECTED NOT DETECTED Final   Neisseria meningitidis NOT DETECTED NOT DETECTED Final   Pseudomonas aeruginosa NOT DETECTED NOT DETECTED Final   Candida albicans NOT DETECTED NOT DETECTED Final   Candida glabrata NOT DETECTED NOT  DETECTED Final   Candida krusei NOT DETECTED NOT DETECTED Final   Candida parapsilosis NOT DETECTED NOT DETECTED Final   Candida tropicalis NOT DETECTED NOT DETECTED Final  Blood culture (routine x 2)     Status: None   Collection Time: 05/22/17  5:22 AM  Result Value Ref Range  Status   Specimen Description BLOOD RIGHT ANTECUBITAL  Final   Special Requests   Final    IN BOTH AEROBIC AND ANAEROBIC BOTTLES Blood Culture adequate volume   Culture NO GROWTH 5 DAYS  Final   Report Status 05/27/2017 FINAL  Final  Culture, blood (routine x 2)     Status: None (Preliminary result)   Collection Time: 05/23/17  5:55 PM  Result Value Ref Range Status   Specimen Description BLOOD LEFT ANTECUBITAL  Final   Special Requests   Final    BOTTLES DRAWN AEROBIC AND ANAEROBIC Blood Culture adequate volume   Culture NO GROWTH 4 DAYS  Final   Report Status PENDING  Incomplete  Culture, blood (routine x 2)     Status: None (Preliminary result)   Collection Time: 05/23/17  8:12 PM  Result Value Ref Range Status   Specimen Description BLOOD LEFT ANTECUBITAL  Final   Special Requests   Final    BOTTLES DRAWN AEROBIC ONLY Blood Culture adequate volume   Culture NO GROWTH 4 DAYS  Final   Report Status PENDING  Incomplete         Radiology Studies: No results found.      Scheduled Meds: . enoxaparin (LOVENOX) injection  40 mg Subcutaneous Q24H  . sodium chloride flush  3 mL Intravenous Q12H   Continuous Infusions: . sodium chloride 10 mL/hr at 05/27/17 1850  . sodium chloride 20 mL/hr at 05/28/17 [id number]  .  ceFAZolin (ANCEF) IV 2 g (05/28/17 [id number])     LOS: 6 days    Time spent: 10 minutes    Alberteen Samhristopher P Kaisy Severino, MD [facility] Pager [phone]  If 7PM-7AM, please contact night-coverage [email or web address] Password [email or web address] 05/28/2017, 11:13 AM

## 2017-05-28 NOTE — Op Note (Signed)
TEE  MV mildly thickened with elongated chord.  No defiinte vegetation.  Trace MR  TV normal  Trace TR  AV normal  No AI  PV normal  Trace PI  LVEF normal    Normal throacic aorta.  Full report to follow

## 2017-05-28 NOTE — Progress Notes (Addendum)
PROGRESS NOTE    Hunter [name]  ZOX:[id number] DOB: [birth year] DOA: 05/22/2017 PCP: Patient, No Pcp Per      Brief Narrative:  31 yo M with hep C, IVDU presents with MSSA bacteremia and discitis.   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 Right greater than left paravertebral soft tissue inflammation with extensive right psoas inflammation/myositis. 20 x 6 mm right psoas abscess. Diffuse posterior paraspinal muscle inflammation in the lumbar spine suggestive of myositis. No evidence of infection in the cervical or thoracic spine. No epidural abscess. 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  No heroin or opiates in a long time.    Patient at present planning to move to [facility] to live with Dad, who he thinks would be willing to take him in (he got clean before while living with his dad).  He was in recovery for almost a year not that long ago here in [place], went to IOP at [facility]. Was on methadone at that time. Never did NA/AA before.  Also did an 18-day inpatient detox in [place], [place] once.   -Appreciate Dr. Jacqualin Combes assistance with possibly starting suboxone when pain is better controlled in 1-2 weeks  Normocytic anemia Stable, baseline Hgb 11s  Hypokalemia Resolved        DVT prophylaxis: Lovenox Code Status: FULL Family Communication: GF at bedside, sleeping Disposition Plan: 6 weeeks inpatient Abx    Consultants:   ID  Neurosurgery  Cardiology for TEE  IMTS for Suboxone  Procedures:   CTA chest  MRI cervical, lumbar and thoracic spine  TTE  TEE 12/21  Antimicrobials:   Vancomycin/Zosyn 12/15 >> 12/16  Cefazolin 12/16 >>  Culture data BCx 12/16 NGTD x2 BCx 12/15 MSSA in 1/2 bottles    Subjective: Back still hurts, but able to walk further.  Good appetite.  No new fever, joint pain, confusion, neck pain, cough.       Objective: Vitals:   05/27/17 1434 05/27/17 2008 05/27/17 2158 05/28/17 0507  BP: 112/72  119/68 124/69  Pulse: 88  87 74  Resp: 18  17 17   Temp: 98 F (36.7 C)  97.8 F (36.6 C) 98.6 F (37 C)  TempSrc: Oral  Oral Oral  SpO2: 98% 98% 99% 99%  Weight:      Height:        Intake/Output Summary (Last 24 hours) at 05/28/2017 1113 Last data filed at 05/28/2017 1000 Gross per 24 hour  Intake 496.33 ml  Output -  Net 496.33 ml   Filed Weights   05/25/17 0445 05/26/17 0500 05/27/17 0516  Weight: 72.5 kg (159 lb 13.3 oz) 74.5  kg (164 lb 3.9 oz) 73.5 kg (162 lb 0.6 oz)    Examination: General appearance: Thin tattooed adult male, alert and lying still comfortably.   HEENT: Anicteric, conjunctiva pink, lids and lashes normal. No nasal deformity, discharge, epistaxis.    Skin: Warm and dry.  No jaundice.  No suspicious rashes or lesions. Cardiac: Tachycardic, regular, nl S1-S2, no murmurs appreciated.  Capillary refill is brisk.  JVP normal.  No LE edema.  Radial pulses 2+ and symmetric.  I do not note fluctuance in the left upper chest. Respiratory: Normal respiratory rate and rhythm.  CTAB without rales  or wheezes. Abdomen: Abdomen soft.  Mild nonfocal TTP without guarding. No ascites, distension, hepatosplenomegaly.   MSK: No deformities or effusions. Neuro: Awake and alert.  EOMI, moves all extremities. Speech fluent.    Psych: Sensorium intact and responding to questions, attention normal. Affect blunted.  Judgment and insight appear normal.    Data Reviewed: I have personally reviewed following labs and imaging studies:  CBC: Recent Labs  Lab 05/22/17 0031 05/22/17 1839 05/24/17 0246 05/25/17 0420 05/26/17 0305  WBC 13.1* 9.9 11.3* 11.7* 10.8*  NEUTROABS  --  7.1  --   --   --   HGB 9.2* 8.9* 9.2* 9.7* 10.0*  HCT 28.4* 27.5* 29.2* 30.6* 31.8*  MCV 85.8 85.4 85.4 86.2 85.9  PLT 380 391 422* 491* 508*   Basic Metabolic Panel: Recent Labs  Lab 05/22/17 1839 05/24/17 0246 05/25/17 0420 05/26/17 0305 05/28/17 0550  NA 135 134* 137 137 134*  K 3.2* 4.0 4.3 3.9 4.2  CL 101 101 103 100* 100*  CO2 27 26 27 27 25   GLUCOSE 141* 105* 104* 97 96  BUN 5* 6 6 9 11   CREATININE 0.70 0.52* 0.57* 0.52* 0.55*  CALCIUM 8.4* 8.6* 8.5* 9.0 8.9  MG 1.7  --   --  1.9  --   PHOS 2.6  --   --   --   --    GFR: Estimated Creatinine Clearance: 139.1 mL/min (A) (by C-G formula based on SCr of 0.55 mg/dL (L)). Liver Function Tests: Recent Labs  Lab 05/22/17 0522 05/22/17 1839  AST 31 29  ALT 20 20  ALKPHOS 83 84  BILITOT 0.4 0.4  PROT 6.9 7.1  ALBUMIN 2.3* 2.4*   Recent Labs  Lab 05/22/17 0522  LIPASE 25   No results for input(s): AMMONIA in the last 168 hours. Coagulation Profile: Recent Labs  Lab 05/28/17 0550  INR 1.10   Cardiac Enzymes: No results for input(s): CKTOTAL, CKMB, CKMBINDEX, TROPONINI in the last 168 hours. BNP (last 3 results) No results for input(s): PROBNP in the last 8760 hours. HbA1C: No results for input(s): HGBA1C in the last 72 hours. CBG: Recent Labs  Lab 05/24/17 0803 05/25/17 0802 05/26/17 0753 05/27/17 0800 05/28/17 0752  GLUCAP  122* 98 102* 94 98   Lipid Profile: No results for input(s): CHOL, HDL, LDLCALC, TRIG, CHOLHDL, LDLDIRECT in the last 72 hours. Thyroid Function Tests: No results for input(s): TSH, T4TOTAL, FREET4, T3FREE, THYROIDAB in the last 72 hours. Anemia Panel: No results for input(s): VITAMINB12, FOLATE, FERRITIN, TIBC, IRON, RETICCTPCT in the last 72 hours. Urine analysis:    Component Value Date/Time   COLORURINE YELLOW 04/26/2017 1840   APPEARANCEUR CLEAR 04/26/2017 1840   LABSPEC 1.010 04/26/2017 1840   PHURINE 6.0 04/26/2017 1840   GLUCOSEU NEGATIVE 04/26/2017 1840   HGBUR NEGATIVE 04/26/2017 1840   BILIRUBINUR NEGATIVE 04/26/2017 1840   KETONESUR  NEGATIVE 04/26/2017 1840   PROTEINUR NEGATIVE 04/26/2017 1840   NITRITE NEGATIVE 04/26/2017 1840   LEUKOCYTESUR NEGATIVE 04/26/2017 1840   Sepsis Labs: @LABRCNTIP (procalcitonin:4,lacticidven:4)  ) Recent Results (from the past 240 hour(s))  Blood culture (routine x 2)     Status: Abnormal   Collection Time: 05/22/17  5:15 AM  Result Value Ref Range Status   Specimen Description BLOOD RIGHT FOREARM  Final   Special Requests   Final    BOTTLES DRAWN AEROBIC ONLY Blood Culture results may not be optimal due to an inadequate volume of blood received in culture bottles   Culture  Setup Time   Final    GRAM POSITIVE COCCI IN CLUSTERS AEROBIC BOTTLE ONLY CRITICAL RESULT CALLED TO, READ BACK BY AND VERIFIED WITH: BMANCHERIL,PHARMD @1259  05/23/17 BY LHOWARD    Culture STAPHYLOCOCCUS AUREUS (A)  Final   Report Status 05/25/2017 FINAL  Final   Organism ID, Bacteria STAPHYLOCOCCUS AUREUS  Final      Susceptibility   Staphylococcus aureus - MIC*    CIPROFLOXACIN <=0.5 SENSITIVE Sensitive     ERYTHROMYCIN <=0.25 SENSITIVE Sensitive     GENTAMICIN <=0.5 SENSITIVE Sensitive     OXACILLIN 0.5 SENSITIVE Sensitive     TETRACYCLINE <=1 SENSITIVE Sensitive     VANCOMYCIN <=0.5 SENSITIVE Sensitive     TRIMETH/SULFA <=10 SENSITIVE Sensitive      CLINDAMYCIN <=0.25 SENSITIVE Sensitive     RIFAMPIN <=0.5 SENSITIVE Sensitive     Inducible Clindamycin NEGATIVE Sensitive     * STAPHYLOCOCCUS AUREUS  Blood Culture ID Panel (Reflexed)     Status: Abnormal   Collection Time: 05/22/17  5:15 AM  Result Value Ref Range Status   Enterococcus species NOT DETECTED NOT DETECTED Final   Listeria monocytogenes NOT DETECTED NOT DETECTED Final   Staphylococcus species DETECTED (A) NOT DETECTED Final    Comment: CRITICAL RESULT CALLED TO, READ BACK BY AND VERIFIED WITH: BMANCHERIL,PHARMD @1259  05/23/17 BY LHOWARD    Staphylococcus aureus DETECTED (A) NOT DETECTED Final    Comment: Methicillin (oxacillin) susceptible Staphylococcus aureus (MSSA). Preferred therapy is anti staphylococcal beta lactam antibiotic (Cefazolin or Nafcillin), unless clinically contraindicated. CRITICAL RESULT CALLED TO, READ BACK BY AND VERIFIED WITH: BMANCHERIL,PHARMD @1259  05/23/17 BY LHOWARD    Methicillin resistance NOT DETECTED NOT DETECTED Final   Streptococcus species NOT DETECTED NOT DETECTED Final   Streptococcus agalactiae NOT DETECTED NOT DETECTED Final   Streptococcus pneumoniae NOT DETECTED NOT DETECTED Final   Streptococcus pyogenes NOT DETECTED NOT DETECTED Final   Acinetobacter baumannii NOT DETECTED NOT DETECTED Final   Enterobacteriaceae species NOT DETECTED NOT DETECTED Final   Enterobacter cloacae complex NOT DETECTED NOT DETECTED Final   Escherichia coli NOT DETECTED NOT DETECTED Final   Klebsiella oxytoca NOT DETECTED NOT DETECTED Final   Klebsiella pneumoniae NOT DETECTED NOT DETECTED Final   Proteus species NOT DETECTED NOT DETECTED Final   Serratia marcescens NOT DETECTED NOT DETECTED Final   Haemophilus influenzae NOT DETECTED NOT DETECTED Final   Neisseria meningitidis NOT DETECTED NOT DETECTED Final   Pseudomonas aeruginosa NOT DETECTED NOT DETECTED Final   Candida albicans NOT DETECTED NOT DETECTED Final   Candida glabrata NOT DETECTED NOT  DETECTED Final   Candida krusei NOT DETECTED NOT DETECTED Final   Candida parapsilosis NOT DETECTED NOT DETECTED Final   Candida tropicalis NOT DETECTED NOT DETECTED Final  Blood culture (routine x 2)     Status: None   Collection Time: 05/22/17  5:22 AM  Result Value Ref Range  Status   Specimen Description BLOOD RIGHT ANTECUBITAL  Final   Special Requests   Final    IN BOTH AEROBIC AND ANAEROBIC BOTTLES Blood Culture adequate volume   Culture NO GROWTH 5 DAYS  Final   Report Status 05/27/2017 FINAL  Final  Culture, blood (routine x 2)     Status: None (Preliminary result)   Collection Time: 05/23/17  5:55 PM  Result Value Ref Range Status   Specimen Description BLOOD LEFT ANTECUBITAL  Final   Special Requests   Final    BOTTLES DRAWN AEROBIC AND ANAEROBIC Blood Culture adequate volume   Culture NO GROWTH 4 DAYS  Final   Report Status PENDING  Incomplete  Culture, blood (routine x 2)     Status: None (Preliminary result)   Collection Time: 05/23/17  8:12 PM  Result Value Ref Range Status   Specimen Description BLOOD LEFT ANTECUBITAL  Final   Special Requests   Final    BOTTLES DRAWN AEROBIC ONLY Blood Culture adequate volume   Culture NO GROWTH 4 DAYS  Final   Report Status PENDING  Incomplete         Radiology Studies: No results found.      Scheduled Meds: . enoxaparin (LOVENOX) injection  40 mg Subcutaneous Q24H  . sodium chloride flush  3 mL Intravenous Q12H   Continuous Infusions: . sodium chloride 10 mL/hr at 05/27/17 1850  . sodium chloride 20 mL/hr at 05/28/17 [id number]  .  ceFAZolin (ANCEF) IV 2 g (05/28/17 [id number])     LOS: 6 days    Time spent: 10 minutes    Alberteen Samhristopher P Saundra Gin, MD [facility] Pager [phone]  If 7PM-7AM, please contact night-coverage [email or web address] Password [email or web address] 05/28/2017, 11:13 AM

## 2017-05-28 NOTE — Anesthesia Preprocedure Evaluation (Addendum)
Anesthesia Evaluation  Patient identified by MRN, date of birth, ID band Patient awake    Reviewed: Allergy & Precautions, NPO status , Patient's Chart, lab work & pertinent test results  History of Anesthesia Complications Negative for: history of anesthetic complications  Airway Mallampati: II  TM Distance: >3 FB Neck ROM: Full    Dental  (+) Dental Advisory Given   Pulmonary neg pulmonary ROS, Current Smoker,    breath sounds clear to auscultation       Cardiovascular  Rhythm:Regular Rate:Normal  05/24/17 ECHO: EF 50-55%, valves OK   Neuro/Psych negative neurological ROS     GI/Hepatic negative GI ROS, (+)     substance abuse  IV drug use, Hepatitis -, C  Endo/Other  negative endocrine ROS  Renal/GU negative Renal ROS     Musculoskeletal   Abdominal   Peds  Hematology  (+) Blood dyscrasia (Hb 10.0), anemia ,   Anesthesia Other Findings   Reproductive/Obstetrics                            Anesthesia Physical Anesthesia Plan  ASA: II  Anesthesia Plan: MAC   Post-op Pain Management:    Induction:   PONV Risk Score and Plan: 0 and Treatment may vary due to age or medical condition  Airway Management Planned: Natural Airway and Nasal Cannula  Additional Equipment:   Intra-op Plan:   Post-operative Plan:   Informed Consent: I have reviewed the patients History and Physical, chart, labs and discussed the procedure including the risks, benefits and alternatives for the proposed anesthesia with the patient or authorized representative who has indicated his/her understanding and acceptance.   Dental advisory given  Plan Discussed with: CRNA and Surgeon  Anesthesia Plan Comments: (Plan routine monitors, MAC)        Anesthesia Quick Evaluation

## 2017-05-28 NOTE — Interval H&P Note (Signed)
History and Physical Interval Note:  05/28/2017 12:29 PM  Hunter [name]  has presented today for surgery, with the diagnosis of bacteremia  The various methods of treatment have been discussed with the patient and family. After consideration of risks, benefits and other options for treatment, the patient has consented to  Procedure(s): TRANSESOPHAGEAL ECHOCARDIOGRAM (TEE) (N/A) as a surgical intervention .  The patient's history has been reviewed, patient examined, no change in status, stable for surgery.  I have reviewed the patient's chart and labs.  Questions were answered to the patient's satisfaction.     Dietrich PatesPaula Jeramyah Goodpasture

## 2017-05-28 NOTE — Progress Notes (Addendum)
Subjective:  Pt hungry this am as he was NPO  Antibiotics:  Anti-infectives (From admission, onward)   Start     Dose/Rate Route Frequency Ordered Stop   05/23/17 1700  ceFAZolin (ANCEF) IVPB 2g/100 mL premix     2 g 200 mL/hr over 30 Minutes Intravenous Every 8 hours 05/23/17 1628     05/23/17 0400  vancomycin (VANCOCIN) IVPB 1000 mg/200 mL premix  Status:  Discontinued     1,000 mg 200 mL/hr over 60 Minutes Intravenous Every 8 hours 05/22/17 1733 05/23/17 1455   05/22/17 2000  piperacillin-tazobactam (ZOSYN) IVPB 3.375 g  Status:  Discontinued     3.375 g 12.5 mL/hr over 240 Minutes Intravenous Every 8 hours 05/22/17 1733 05/23/17 1628   05/22/17 1745  vancomycin (VANCOCIN) 500 mg in sodium chloride 0.9 % 100 mL IVPB     500 mg 100 mL/hr over 60 Minutes Intravenous  Once 05/22/17 1733 05/22/17 2042   05/22/17 1115  vancomycin (VANCOCIN) IVPB 1000 mg/200 mL premix     1,000 mg 200 mL/hr over 60 Minutes Intravenous  Once 05/22/17 1100 05/22/17 1220   05/22/17 1115  piperacillin-tazobactam (ZOSYN) IVPB 3.375 g     3.375 g 100 mL/hr over 30 Minutes Intravenous  Once 05/22/17 1100 05/22/17 1744   05/22/17 0000  azithromycin (ZITHROMAX) 250 MG tablet     250 mg Oral Daily 05/22/17 1051        Medications: Scheduled Meds: . enoxaparin (LOVENOX) injection  40 mg Subcutaneous Q24H  . sodium chloride flush  3 mL Intravenous Q12H   Continuous Infusions: . sodium chloride 10 mL/hr at 05/27/17 1850  .  ceFAZolin (ANCEF) IV 2 g (05/28/17 1456)   PRN Meds:.acetaminophen, bisacodyl, HYDROmorphone (DILAUDID) injection, ondansetron **OR** ondansetron (ZOFRAN) IV, polyethylene glycol, senna-docusate    Objective: Weight change:   Intake/Output Summary (Last 24 hours) at 05/28/2017 1907 Last data filed at 05/28/2017 1500 Gross per 24 hour  Intake 1099.5 ml  Output -  Net 1099.5 ml   Blood pressure 119/70, pulse 72, temperature 98.4 F (36.9 C), temperature source  Oral, resp. rate 15, height 6' 1"  (1.854 m), weight 162 lb 0.6 oz (73.5 kg), SpO2 100 %. Temp:  [97.8 F (36.6 C)-98.6 F (37 C)] 98.4 F (36.9 C) (12/21 1338) Pulse Rate:  [69-87] 72 (12/21 1338) Resp:  [10-17] 15 (12/21 1338) BP: (103-124)/(64-70) 119/70 (12/21 1338) SpO2:  [98 %-100 %] 100 % (12/21 1338) FiO2 (%):  [21 %] 21 % (12/20 2008) Weight:  [162 lb 0.6 oz (73.5 kg)] 162 lb 0.6 oz (73.5 kg) (12/21 1204)  Physical Exam: General: Alert and awake, oriented x3, there are 2 friends in the room including his girlfriend who is asleep sitting in a chair and difficult to arouse HEENT: anicteric sclera, pupils reactive to light and accommodation, EOMI CVS regular rate, normal r,  no murmur rubs or gallops Chest: clear to auscultation bilaterally, no wheezing, rales or rhonchi Abdomen: soft nontender, nondistended, normal bowel sounds, Extremities: no  clubbing or edema noted bilaterally Skin: no rashes, scar right hand, tenderness near left nipple and chest with tenderness though area of fluctuance has gone down, tattoos  Neuro: nonfocal  CBC:  CBC Latest Ref Rng & Units 05/26/2017 05/25/2017 05/24/2017  WBC 4.0 - 10.5 K/uL 10.8(H) 11.7(H) 11.3(H)  Hemoglobin 13.0 - 17.0 g/dL 10.0(L) 9.7(L) 9.2(L)  Hematocrit 39.0 - 52.0 % 31.8(L) 30.6(L) 29.2(L)  Platelets 150 - 400 K/uL 508(H) 491(H) 422(H)  BMET Recent Labs    05/26/17 0305 05/28/17 0550  NA 137 134*  K 3.9 4.2  CL 100* 100*  CO2 27 25  GLUCOSE 97 96  BUN 9 11  CREATININE 0.52* 0.55*  CALCIUM 9.0 8.9     Liver Panel  No results for input(s): PROT, ALBUMIN, AST, ALT, ALKPHOS, BILITOT, BILIDIR, IBILI in the last 72 hours.     Sedimentation Rate No results for input(s): ESRSEDRATE in the last 72 hours. C-Reactive Protein No results for input(s): CRP in the last 72 hours.  Micro Results: Recent Results (from the past 720 hour(s))  Culture, blood (Routine X 2) w Reflex to ID Panel     Status: None    Collection Time: 04/29/17  2:29 PM  Result Value Ref Range Status   Specimen Description BLOOD RIGHT ANTECUBITAL  Final   Special Requests IN PEDIATRIC BOTTLE Blood Culture adequate volume  Final   Culture NO GROWTH 5 DAYS  Final   Report Status 05/04/2017 FINAL  Final  Culture, blood (Routine X 2) w Reflex to ID Panel     Status: None   Collection Time: 04/29/17  2:40 PM  Result Value Ref Range Status   Specimen Description BLOOD LEFT FOREARM  Final   Special Requests IN PEDIATRIC BOTTLE Blood Culture adequate volume  Final   Culture NO GROWTH 5 DAYS  Final   Report Status 05/04/2017 FINAL  Final  Blood culture (routine x 2)     Status: Abnormal   Collection Time: 05/22/17  5:15 AM  Result Value Ref Range Status   Specimen Description BLOOD RIGHT FOREARM  Final   Special Requests   Final    BOTTLES DRAWN AEROBIC ONLY Blood Culture results may not be optimal due to an inadequate volume of blood received in culture bottles   Culture  Setup Time   Final    GRAM POSITIVE COCCI IN CLUSTERS AEROBIC BOTTLE ONLY CRITICAL RESULT CALLED TO, READ BACK BY AND VERIFIED WITH: BMANCHERIL,PHARMD @1259  05/23/17 BY LHOWARD    Culture STAPHYLOCOCCUS AUREUS (A)  Final   Report Status 05/25/2017 FINAL  Final   Organism ID, Bacteria STAPHYLOCOCCUS AUREUS  Final      Susceptibility   Staphylococcus aureus - MIC*    CIPROFLOXACIN <=0.5 SENSITIVE Sensitive     ERYTHROMYCIN <=0.25 SENSITIVE Sensitive     GENTAMICIN <=0.5 SENSITIVE Sensitive     OXACILLIN 0.5 SENSITIVE Sensitive     TETRACYCLINE <=1 SENSITIVE Sensitive     VANCOMYCIN <=0.5 SENSITIVE Sensitive     TRIMETH/SULFA <=10 SENSITIVE Sensitive     CLINDAMYCIN <=0.25 SENSITIVE Sensitive     RIFAMPIN <=0.5 SENSITIVE Sensitive     Inducible Clindamycin NEGATIVE Sensitive     * STAPHYLOCOCCUS AUREUS  Blood Culture ID Panel (Reflexed)     Status: Abnormal   Collection Time: 05/22/17  5:15 AM  Result Value Ref Range Status   Enterococcus species  NOT DETECTED NOT DETECTED Final   Listeria monocytogenes NOT DETECTED NOT DETECTED Final   Staphylococcus species DETECTED (A) NOT DETECTED Final    Comment: CRITICAL RESULT CALLED TO, READ BACK BY AND VERIFIED WITH: BMANCHERIL,PHARMD @1259  05/23/17 BY LHOWARD    Staphylococcus aureus DETECTED (A) NOT DETECTED Final    Comment: Methicillin (oxacillin) susceptible Staphylococcus aureus (MSSA). Preferred therapy is anti staphylococcal beta lactam antibiotic (Cefazolin or Nafcillin), unless clinically contraindicated. CRITICAL RESULT CALLED TO, READ BACK BY AND VERIFIED WITH: BMANCHERIL,PHARMD @1259  05/23/17 BY LHOWARD    Methicillin resistance NOT DETECTED  NOT DETECTED Final   Streptococcus species NOT DETECTED NOT DETECTED Final   Streptococcus agalactiae NOT DETECTED NOT DETECTED Final   Streptococcus pneumoniae NOT DETECTED NOT DETECTED Final   Streptococcus pyogenes NOT DETECTED NOT DETECTED Final   Acinetobacter baumannii NOT DETECTED NOT DETECTED Final   Enterobacteriaceae species NOT DETECTED NOT DETECTED Final   Enterobacter cloacae complex NOT DETECTED NOT DETECTED Final   Escherichia coli NOT DETECTED NOT DETECTED Final   Klebsiella oxytoca NOT DETECTED NOT DETECTED Final   Klebsiella pneumoniae NOT DETECTED NOT DETECTED Final   Proteus species NOT DETECTED NOT DETECTED Final   Serratia marcescens NOT DETECTED NOT DETECTED Final   Haemophilus influenzae NOT DETECTED NOT DETECTED Final   Neisseria meningitidis NOT DETECTED NOT DETECTED Final   Pseudomonas aeruginosa NOT DETECTED NOT DETECTED Final   Candida albicans NOT DETECTED NOT DETECTED Final   Candida glabrata NOT DETECTED NOT DETECTED Final   Candida krusei NOT DETECTED NOT DETECTED Final   Candida parapsilosis NOT DETECTED NOT DETECTED Final   Candida tropicalis NOT DETECTED NOT DETECTED Final  Blood culture (routine x 2)     Status: None   Collection Time: 05/22/17  5:22 AM  Result Value Ref Range Status   Specimen  Description BLOOD RIGHT ANTECUBITAL  Final   Special Requests   Final    IN BOTH AEROBIC AND ANAEROBIC BOTTLES Blood Culture adequate volume   Culture NO GROWTH 5 DAYS  Final   Report Status 05/27/2017 FINAL  Final  Culture, blood (routine x 2)     Status: None   Collection Time: 05/23/17  5:55 PM  Result Value Ref Range Status   Specimen Description BLOOD LEFT ANTECUBITAL  Final   Special Requests   Final    BOTTLES DRAWN AEROBIC AND ANAEROBIC Blood Culture adequate volume   Culture NO GROWTH 5 DAYS  Final   Report Status 05/28/2017 FINAL  Final  Culture, blood (routine x 2)     Status: None   Collection Time: 05/23/17  8:12 PM  Result Value Ref Range Status   Specimen Description BLOOD LEFT ANTECUBITAL  Final   Special Requests   Final    BOTTLES DRAWN AEROBIC ONLY Blood Culture adequate volume   Culture NO GROWTH 5 DAYS  Final   Report Status 05/28/2017 FINAL  Final    Studies/Results: No results found.    Assessment/Plan:  INTERVAL HISTORY:   Tenderness in breast tissue and area of fluctuance has gone down TEE scheduled for tomorrow  Dr. Damita Dunnings has seen from [facility]  Principal Problem:   Bacteremia due to methicillin susceptible Staphylococcus aureus (MSSA) Active Problems:   IVDU (intravenous drug user)   Normocytic anemia   Chronic hepatitis C without hepatic coma (HCC)   Discitis of lumbar region   Hypokalemia   Psoas abscess (HCC)   Atelectasis of both lungs   Chest wall abscess    Hunter [name] is a 31 y.o. male with history of IV drug use, prior hand infection and group A streptococcal bacteremia, status post surgery in November discharged on Augmentin which he stopped taking now admitted with MSSA bacteremia and sepsis, lumbar disc infection with psoas abscess       [place] [facility] Staphylococcus aureus bacteremia   Staphylococcus aureus bacteremia (SAB) is associated with a high rate of complications and mortality.   Specific aspects of clinical management are critical to optimizing the outcome of patients with SAB.  Therefore, the [facility] (  [facility]) has initiated an intervention aimed at improving the management of SAB at [facility].  To do so, Infectious Diseases physicians are providing an evidence-based consult for the management of all patients with SAB.     Yes No Comments  Perform follow-up blood cultures (even if the patient is afebrile) to ensure clearance of bacteremia [x]  []  Blood cultures no growth x 4 days  Remove vascular catheter and obtain follow-up blood cultures after the removal of the catheter []  []  DO NOT PLACE CENTRAL LINE  Perform echocardiography to evaluate for endocarditis (transthoracic ECHO is 40-50% sensitive, TEE is > 90% sensitive) []  []  Please keep in mind, that neither test can definitively EXCLUDE endocarditis, and that should clinical suspicion remain high for endocarditis the patient should then still be treated with an "endocarditis" duration of therapy = 6 weeks  TEE tomorrow  Consult electrophysiologist to evaluate implanted cardiac device (pacemaker, ICD) []  []    Ensure source control [x]  []  Have all abscesses been drained effectively? Have deep seeded infections (septic joints or osteomyelitis) had appropriate surgical debridement?  I am worried about the chest wall area that could have abscess, infection, not seen on CT scan but may require I and D for now continue abx and compresses  Investigate for "metastatic" sites of infection []  []  Does the patient have ANY symptom or physical exam finding that would suggest a deeper infection (back or neck pain that may be suggestive of vertebral osteomyelitis or epidural abscess, muscle pain that could be a symptom of pyomyositis)?  Keep in mind that for deep seeded infections MRI imaging with contrast is preferred rather than other often insensitive tests such as plain x-rays, especially early in a  patient's presentation.  Change antibiotic therapy to _Cefazolin []  []  Beta-lactam antibiotics are preferred for MSSA due to higher cure rates.   If on Vancomycin, goal trough should be 15 - 20 mcg/mL  Estimated duration of IV antibiotic therapy:  6 weeks, possibly in the hospital  []  []  Consult case management for probably prolonged outpatient IV antibiotic therapy   #2  The above discussion he will need protracted therapy and I would favor systemic antibiotics but certainly not at home with a PICC line.  If he is going to continue parenteral antibiotics will need to be either in the hospital or in a monitored skilled nursing facility.  #3 possible chest wall abscess: Monitor area closely   Continue warm compresses  #4 IV drug use: He is open to treatment in Suboxone/  Dr. Evette Doffing has seen the patient today and I greatly appreciate his help. Pt also with metamphemine problem  #5 HCV + ab: check HCV RNA  IV antibiotic plan is as follows   Diagnosis: MSSA bacteremia and vertebral osteomyelitis, rule out endocarditis  Culture Result: MSSA  No Known Allergies   Duration: 6 weeks End Date: January 26 th  Labs weekly while on IV antibiotics: _x_ CBC with differential _x_ BMP  x__ CRP x__ ESR   Fax weekly labs to [phone]  Clinic Follow Up Appt:  2 weeks after DC.  Please call us back as he nears completion of his antibiotics.  I will ask Dr. Johnnye Sima to check  on TEE read tomorrow  Dr. Johnnye Sima is covering December 22, 23 Dr. Megan Salon on "" 24-26 Dr. Baxter Flattery 27-29 Me 30-1st    LOS: 6 days   Alcide Evener 05/28/2017, 7:07 PM

## 2017-05-29 LAB — CREATININE, SERUM
Creatinine, Ser: 0.65 mg/dL (ref 0.61–1.24)
GFR calc Af Amer: >60 mL/min (ref >60)
GFR calc non Af Amer: >60 mL/min (ref >60)

## 2017-05-29 LAB — GLUCOSE, CAPILLARY: Glucose-Capillary: 209 mg/dL — ABNORMAL HIGH (ref 65–99)

## 2017-05-29 MED ORDER — OXYCODONE HCL 5 MG PO TABS
5.0000 mg | ORAL_TABLET | ORAL | Status: DC | PRN
  Administered 2017-05-30 – 2017-06-13 (×74): 10 mg via ORAL
  Administered 2017-06-13: 5 mg via ORAL
  Administered 2017-06-14 – 2017-06-22 (×44): 10 mg via ORAL
  Administered 2017-06-22: 5 mg via ORAL
  Administered 2017-06-22 – 2017-06-23 (×4): 10 mg via ORAL
  Filled 2017-05-29 (×3): qty 2
  Filled 2017-05-29: qty 1
  Filled 2017-05-29 (×24): qty 2
  Filled 2017-05-29: qty 1
  Filled 2017-05-29 (×100): qty 2

## 2017-05-29 MED ORDER — IBUPROFEN 600 MG PO TABS
600.0000 mg | ORAL_TABLET | Freq: Three times a day (TID) | ORAL | Status: AC
  Administered 2017-05-29 – 2017-05-30 (×3): 600 mg via ORAL
  Filled 2017-05-29 (×3): qty 1

## 2017-05-29 NOTE — Progress Notes (Addendum)
PROGRESS NOTE    [name]  WGN:[id number] DOB: 05-Jul-1985 DOA: 05/22/2017 PCP: Patient, No Pcp Per      Brief Narrative:  31 yo M with hep C, IVDU presents with MSSA bacteremia and discitis.   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 Right greater than left paravertebral soft tissue inflammation with extensive right psoas inflammation/myositis. 20 x 6 mm right psoas abscess. Diffuse posterior paraspinal muscle inflammation in the lumbar spine suggestive of myositis. No evidence of infection in the cervical or thoracic spine. No epidural abscess. -Continue cefazolin IV -ID signing off, will need to be called back around Jan 20 as he nears completion of ABx (on Jan 26) -Per SW, low likelihood for community SNF to take patient, will need inpatient care for 6 weeks -TEE shows no definite vegetation -Continue IV dilaudid, encourage adding acetaminophen, need to transition to orals, oxycodone started -Attempt to start transitioning to PO oxycodone instead of hydromorphone   IVDU (intravenous drug user) Drug use 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  No heroin or opiates in a long time.    Patient at present planning to move to [facility] to live with Dad, who he thinks would be willing to take him in (he got clean before while living with his dad).  He was in recovery for almost a year not that long ago here in [place], went to IOP at [facility]. Was on methadone at that time. Never did NA/AA before.  Also did [place] 18-day  [place]patient detox in [place].  -Appreciate Dr. Jacqualin CombesVincent's assistance with possibly starting suboxone when pain is better controlled in 1-2 weeks  Normocytic anemia Stable, baseline Hgb 11s  Hypokalemia Resolved        DVT prophylaxis: Lovenox Code Status: FULL Family Communication: None Disposition Plan: 6 weeeks inpatient Abx    Consultants:   ID  Neurosurgery  Cardiology for TEE  IMTS for Suboxone  Procedures:   CTA chest  MRI cervical, lumbar and thoracic spine  TTE  TEE 12/21  Antimicrobials:   Vancomycin/Zosyn 12/15 >> 12/16  Cefazolin 12/16 >>  Culture data BCx 12/16 NGTD x2 BCx 12/15 MSSA in 1/2 bottles    Subjective: Back still hurts, but able to walk further.  Good appetite.  No new fever, joint pain, confusion, neck pain, cough.       Objective: Vitals:   05/28/17 1338 05/28/17 2218 05/29/17 0500 05/29/17 1511  BP: 119/70 131/86 117/74 126/79  Pulse: 72 (!) 105 74 85  Resp: 15 17 18 17   Temp: 98.4 F (36.9 C) (!) 97.5 F (36.4 C) 98 F (36.7 C) 98.2 F (36.8 C)  TempSrc: Oral Oral Oral Oral  SpO2: 100% 100% 98% 100%  Weight:   73.5 kg (162 lb 0.6 oz)   Height:        Intake/Output Summary (Last 24 hours) at 05/29/2017 1801 Last data filed at 05/29/2017 1134 Gross per 24 hour  Intake 695.66 ml  Output -  Net 695.66 ml   Filed Weights   05/27/17 0516 05/28/17 1204  05/29/17 0500  Weight: 73.5 kg (162 lb 0.6 oz) 73.5 kg (162 lb 0.6 oz) 73.5 kg (162 lb 0.6 oz)    Examination: General appearance: Thin tattooed adult male, alert and lying still comfortably.   HEENT: Anicteric, conjunctiva pink, lids and lashes normal. No nasal deformity, discharge, epistaxis.    Skin: Warm and dry.  No jaundice.  No suspicious rashes or lesions. Cardiac: Tachycardic, regular, nl S1-S2, no murmurs appreciated.  Capillary refill is brisk.  JVP normal.  No LE edema.  Radial pulses 2+ and symmetric.  I do not note fluctuance in the  left upper chest. Respiratory: Normal respiratory rate and rhythm.  CTAB without rales or wheezes. Abdomen: Abdomen soft.  Mild nonfocal TTP without guarding. No ascites, distension, hepatosplenomegaly.   MSK: No deformities or effusions.  Tenderness in left paraspinal muscles, low down.   Neuro: Awake and alert.  EOMI, moves all extremities. Speech fluent.    Psych: Sensorium intact and responding to questions, attention normal. Affect blunted.  Judgment and insight appear normal.    Data Reviewed: I have personally reviewed following labs and imaging studies:  CBC: Recent Labs  Lab 05/22/17 1839 05/24/17 0246 05/25/17 0420 05/26/17 0305  WBC 9.9 11.3* 11.7* 10.8*  NEUTROABS 7.1  --   --   --   HGB 8.9* 9.2* 9.7* 10.0*  HCT 27.5* 29.2* 30.6* 31.8*  MCV 85.4 85.4 86.2 85.9  PLT 391 422* 491* 508*   Basic Metabolic Panel: Recent Labs  Lab 05/22/17 1839 05/24/17 0246 05/25/17 0420 05/26/17 0305 05/28/17 0550 05/29/17 0511  NA 135 134* 137 137 134*  --   K 3.2* 4.0 4.3 3.9 4.2  --   CL 101 101 103 100* 100*  --   CO2 27 26 27 27 25   --   GLUCOSE 141* 105* 104* 97 96  --   BUN 5* 6 6 9 11   --   CREATININE 0.70 0.52* 0.57* 0.52* 0.55* 0.65  CALCIUM 8.4* 8.6* 8.5* 9.0 8.9  --   MG 1.7  --   --  1.9  --   --   PHOS 2.6  --   --   --   --   --    GFR: Estimated Creatinine Clearance: 139.1 mL/min (by C-G formula based on SCr of 0.65 mg/dL). Liver Function Tests: Recent Labs  Lab 05/22/17 1839  AST 29  ALT 20  ALKPHOS 84  BILITOT 0.4  PROT 7.1  ALBUMIN 2.4*   No results for input(s): LIPASE, AMYLASE in the last 168 hours. No results for input(s): AMMONIA in the last 168 hours. Coagulation Profile: Recent Labs  Lab 05/28/17 0550  INR 1.10   Cardiac Enzymes: No results for input(s): CKTOTAL, CKMB, CKMBINDEX, TROPONINI in the last 168 hours. BNP (last 3 results) No results for input(s): PROBNP in the last 8760 hours. HbA1C: No results for input(s): HGBA1C in  the last 72 hours. CBG: Recent Labs  Lab 05/25/17 0802 05/26/17 0753 05/27/17 0800 05/28/17 0752 05/29/17 0845  GLUCAP 98 102* 94 98 209*   Lipid Profile: No results for input(s): CHOL, HDL, LDLCALC, TRIG, CHOLHDL, LDLDIRECT in the last 72 hours. Thyroid Function Tests: No results for input(s): TSH, T4TOTAL, FREET4, T3FREE, THYROIDAB in the last 72 hours. Anemia Panel: No results for input(s): VITAMINB12, FOLATE, FERRITIN, TIBC, IRON, RETICCTPCT in the last 72 hours. Urine analysis:    Component Value Date/Time   COLORURINE YELLOW 04/26/2017 1840   APPEARANCEUR CLEAR 04/26/2017 1840  LABSPEC 1.010 04/26/2017 1840   PHURINE 6.0 04/26/2017 1840   GLUCOSEU NEGATIVE 04/26/2017 1840   HGBUR NEGATIVE 04/26/2017 1840   BILIRUBINUR NEGATIVE 04/26/2017 1840   KETONESUR NEGATIVE 04/26/2017 1840   PROTEINUR NEGATIVE 04/26/2017 1840   NITRITE NEGATIVE 04/26/2017 1840   LEUKOCYTESUR NEGATIVE 04/26/2017 1840   Sepsis Labs: @LABRCNTIP (procalcitonin:4,lacticidven:4)  ) Recent Results (from the past 240 hour(s))  Blood culture (routine x 2)     Status: Abnormal   Collection Time: 05/22/17  5:15 AM  Result Value Ref Range Status   Specimen Description BLOOD RIGHT FOREARM  Final   Special Requests   Final    BOTTLES DRAWN AEROBIC ONLY Blood Culture results may not be optimal due to an inadequate volume of blood received in culture bottles   Culture  Setup Time   Final    GRAM POSITIVE COCCI IN CLUSTERS AEROBIC BOTTLE ONLY CRITICAL RESULT CALLED TO, READ BACK BY AND VERIFIED WITH: BMANCHERIL,PHARMD @1259  05/23/17 BY LHOWARD    Culture STAPHYLOCOCCUS AUREUS (A)  Final   Report Status 05/25/2017 FINAL  Final   Organism ID, Bacteria STAPHYLOCOCCUS AUREUS  Final      Susceptibility   Staphylococcus aureus - MIC*    CIPROFLOXACIN <=0.5 SENSITIVE Sensitive     ERYTHROMYCIN <=0.25 SENSITIVE Sensitive     GENTAMICIN <=0.5 SENSITIVE Sensitive     OXACILLIN 0.5 SENSITIVE Sensitive      TETRACYCLINE <=1 SENSITIVE Sensitive     VANCOMYCIN <=0.5 SENSITIVE Sensitive     TRIMETH/SULFA <=10 SENSITIVE Sensitive     CLINDAMYCIN <=0.25 SENSITIVE Sensitive     RIFAMPIN <=0.5 SENSITIVE Sensitive     Inducible Clindamycin NEGATIVE Sensitive     * STAPHYLOCOCCUS AUREUS  Blood Culture ID Panel (Reflexed)     Status: Abnormal   Collection Time: 05/22/17  5:15 AM  Result Value Ref Range Status   Enterococcus species NOT DETECTED NOT DETECTED Final   Listeria monocytogenes NOT DETECTED NOT DETECTED Final   Staphylococcus species DETECTED (A) NOT DETECTED Final    Comment: CRITICAL RESULT CALLED TO, READ BACK BY AND VERIFIED WITH: BMANCHERIL,PHARMD @1259  05/23/17 BY LHOWARD    Staphylococcus aureus DETECTED (A) NOT DETECTED Final    Comment: Methicillin (oxacillin) susceptible Staphylococcus aureus (MSSA). Preferred therapy is anti staphylococcal beta lactam antibiotic (Cefazolin or Nafcillin), unless clinically contraindicated. CRITICAL RESULT CALLED TO, READ BACK BY AND VERIFIED WITH: BMANCHERIL,PHARMD @1259  05/23/17 BY LHOWARD    Methicillin resistance NOT DETECTED NOT DETECTED Final   Streptococcus species NOT DETECTED NOT DETECTED Final   Streptococcus agalactiae NOT DETECTED NOT DETECTED Final   Streptococcus pneumoniae NOT DETECTED NOT DETECTED Final   Streptococcus pyogenes NOT DETECTED NOT DETECTED Final   Acinetobacter baumannii NOT DETECTED NOT DETECTED Final   Enterobacteriaceae species NOT DETECTED NOT DETECTED Final   Enterobacter cloacae complex NOT DETECTED NOT DETECTED Final   Escherichia coli NOT DETECTED NOT DETECTED Final   Klebsiella oxytoca NOT DETECTED NOT DETECTED Final   Klebsiella pneumoniae NOT DETECTED NOT DETECTED Final   Proteus species NOT DETECTED NOT DETECTED Final   Serratia marcescens NOT DETECTED NOT DETECTED Final   Haemophilus influenzae NOT DETECTED NOT DETECTED Final   Neisseria meningitidis NOT DETECTED NOT DETECTED Final   Pseudomonas  aeruginosa NOT DETECTED NOT DETECTED Final   Candida albicans NOT DETECTED NOT DETECTED Final   Candida glabrata NOT DETECTED NOT DETECTED Final   Candida krusei NOT DETECTED NOT DETECTED Final   Candida parapsilosis NOT DETECTED NOT DETECTED Final   Candida  tropicalis NOT DETECTED NOT DETECTED Final  Blood culture (routine x 2)     Status: None   Collection Time: 05/22/17  5:22 AM  Result Value Ref Range Status   Specimen Description BLOOD RIGHT ANTECUBITAL  Final   Special Requests   Final    IN BOTH AEROBIC AND ANAEROBIC BOTTLES Blood Culture adequate volume   Culture NO GROWTH 5 DAYS  Final   Report Status 05/27/2017 FINAL  Final  Culture, blood (routine x 2)     Status: None   Collection Time: 05/23/17  5:55 PM  Result Value Ref Range Status   Specimen Description BLOOD LEFT ANTECUBITAL  Final   Special Requests   Final    BOTTLES DRAWN AEROBIC AND ANAEROBIC Blood Culture adequate volume   Culture NO GROWTH 5 DAYS  Final   Report Status 05/28/2017 FINAL  Final  Culture, blood (routine x 2)     Status: None   Collection Time: 05/23/17  8:12 PM  Result Value Ref Range Status   Specimen Description BLOOD LEFT ANTECUBITAL  Final   Special Requests   Final    BOTTLES DRAWN AEROBIC ONLY Blood Culture adequate volume   Culture NO GROWTH 5 DAYS  Final   Report Status 05/28/2017 FINAL  Final         Radiology Studies: No results found.      Scheduled Meds: . enoxaparin (LOVENOX) injection  40 mg Subcutaneous Q24H  . ibuprofen  600 mg Oral TID  . sodium chloride flush  3 mL Intravenous Q12H   Continuous Infusions: . sodium chloride 10 mL/hr at 05/29/17 0608  .  ceFAZolin (ANCEF) IV Stopped (05/29/17 1518)     LOS: 7 days    Time spent: 10 minutes    Alberteen Sam, MD [facility] Pager [phone]  If 7PM-7AM, please contact night-coverage [email or web address] Password [email or web address] 05/29/2017, 6:01 PM

## 2017-05-30 ENCOUNTER — Encounter (HOSPITAL_COMMUNITY): Payer: Self-pay | Admitting: Internal Medicine

## 2017-05-30 LAB — GLUCOSE, CAPILLARY
GLUCOSE-CAPILLARY: 101 mg/dL — AB (ref 65–99)
GLUCOSE-CAPILLARY: 266 mg/dL — AB (ref 65–99)
Glucose-Capillary: 107 mg/dL — ABNORMAL HIGH (ref 65–99)

## 2017-05-30 MED ORDER — HYDROMORPHONE HCL 1 MG/ML IJ SOLN
1.0000 mg | Freq: Three times a day (TID) | INTRAMUSCULAR | Status: DC | PRN
  Administered 2017-05-30 – 2017-05-31 (×3): 1 mg via INTRAVENOUS
  Filled 2017-05-30 (×3): qty 1

## 2017-05-30 NOTE — Progress Notes (Addendum)
PROGRESS NOTE  [name] ZOX:[id number] DOB: 06-11-1985 DOA: 05/22/2017 PCP: Patient, No Pcp Per  HPI/Recap of past 24 hours: Hunter Distancenthony [name] is a 31 y.o. male with history of IV drug use, prior hand infection and group A streptococcal bacteremia, status post surgery in November discharged on Augmentin which he stopped taking now admitted with MSSA bacteremia and sepsis, lumbar disc infection with psoas abscess  Today, pt denies any new complaints, still reports back pain, denies any chest pain, SOB, abdominal pain, N/V/D/C, fever/chills.  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 Right greater than left paravertebral soft tissue inflammation with extensive right psoas inflammation/myositis. 20 x 6 mm right psoas abscess. Diffuse posterior paraspinal muscle inflammation in the lumbar spine suggestive of myositis. No evidence of infection in the cervical or thoracic spine. No epidural abscess. BC grew MSSA, repeat BC NGTD -Continue cefazolin IV -ID signing off, will need to be called back around Jan 20 as he nears completion of ABx (on    Jan 26) -Per SW, low likelihood for community SNF to take patient, will need inpatient care for 6 weeks -TEE shows no definite vegetation -Titrate IV dilaudid, plan to d/c, start PO oxycodone  #Possible chest wall abscess No fluctuance noted in the left upper chest Monitor closely, continue warm compress  #IVDU (intravenous drug  user) Drug use 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  No heroin or opiates in a long time.   [place] [name] Dad, who he thinks would be willing to take him in (he got clean before while living with his dad).  He was in recovery for almost a year not that long ago here in [place], went to IOP at [facility]. Was on methadone at that time. Never did NA/AA before.  Also did [place] 18-day inpatient detox in [place].  -Appreciate Dr. Jacqualin CombesVincent's assistance with possibly starting suboxone when pain is better controlled in 1-2 weeks  #HCV + AB, Genotype 2b  HCV RNA 6.547, hep c quantitative 3,520, 000 ID on board, app recs  #Normocytic anemia Stable, baseline Hgb 11s  #Hypokalemia Resolved    Code Status: Full  Family Communication: None at bedside   Disposition Plan: Long term AB, SNF Vs most likely inpatient. IVDU cant go home with PICC line   Consultants:  ID  Cardiology for TEE  Neurosurgery  IMTS for suboxone  Procedures:  TEE   Antimicrobials:  IV Cefazolin  DVT prophylaxis:  Lovenox   Objective: Vitals:   05/29/17 2144 05/30/17 0500 05/30/17 0517 05/30/17 1454  BP: 123/72  115/70 (!) 111/57  Pulse: 83  66 84  Resp: 18  16 16   Temp: 97.8 F (36.6 C)  98.2 F (36.8 C) 98.3 F (36.8 C)  TempSrc: Oral  Oral Oral  SpO2: 100%  99% 100%  Weight:  75.9 kg (167 lb 5.3 oz)    Height:        Intake/Output Summary (Last 24 hours) at 05/30/2017 1649 Last data filed  at 05/30/2017 1454 Gross per 24 hour  Intake 780 ml  Output -  Net 780 ml   Filed Weights   05/28/17 1204 05/29/17 0500 05/30/17 0500  Weight: 73.5 kg (162 lb 0.6 oz) 73.5 kg (162 lb 0.6 oz) 75.9 kg (167 lb 5.3 oz)    Exam:   General: Alert, awake, oriented x3, multiple tattoos noted  Cardiovascular: S1-S2 present, no added heart sounds  Respiratory: Chest clear bilaterally  Abdomen: Soft, nontender, nondistended, bowel sounds  present  Musculoskeletal: Tenderness in the lower left paraspinal muscles  Skin: Multiple tattoos noted, no rash  Psychiatry: Normal mood   Data Reviewed: CBC: Recent Labs  Lab 05/24/17 0246 05/25/17 0420 05/26/17 0305  WBC 11.3* 11.7* 10.8*  HGB 9.2* 9.7* 10.0*  HCT 29.2* 30.6* 31.8*  MCV 85.4 86.2 85.9  PLT 422* 491* 508*   Basic Metabolic Panel: Recent Labs  Lab 05/24/17 0246 05/25/17 0420 05/26/17 0305 05/28/17 0550 05/29/17 0511  NA 134* 137 137 134*  --   K 4.0 4.3 3.9 4.2  --   CL 101 103 100* 100*  --   CO2 26 27 27 25   --   GLUCOSE 105* 104* 97 96  --   BUN 6 6 9 11   --   CREATININE 0.52* 0.57* 0.52* 0.55* 0.65  CALCIUM 8.6* 8.5* 9.0 8.9  --   MG  --   --  1.9  --   --    GFR: Estimated Creatinine Clearance: 143.6 mL/min (by C-G formula based on SCr of 0.65 mg/dL). Liver Function Tests: No results for input(s): AST, ALT, ALKPHOS, BILITOT, PROT, ALBUMIN in the last 168 hours. No results for input(s): LIPASE, AMYLASE in the last 168 hours. No results for input(s): AMMONIA in the last 168 hours. Coagulation Profile: Recent Labs  Lab 05/28/17 0550  INR 1.10   Cardiac Enzymes: No results for input(s): CKTOTAL, CKMB, CKMBINDEX, TROPONINI in the last 168 hours. BNP (last 3 results) No results for input(s): PROBNP in the last 8760 hours. HbA1C: No results for input(s): HGBA1C in the last 72 hours. CBG: Recent Labs  Lab 05/28/17 0752 05/29/17 0845 05/30/17 0747 05/30/17 1201 05/30/17 1616  GLUCAP 98 209* 107* 101* 266*   Lipid Profile: No results for input(s): CHOL, HDL, LDLCALC, TRIG, CHOLHDL, LDLDIRECT in the last 72 hours. Thyroid Function Tests: No results for input(s): TSH, T4TOTAL, FREET4, T3FREE, THYROIDAB in the last 72 hours. Anemia Panel: No results for input(s): VITAMINB12, FOLATE, FERRITIN, TIBC, IRON, RETICCTPCT in the last 72 hours. Urine analysis:    Component Value Date/Time   COLORURINE YELLOW 04/26/2017 1840    APPEARANCEUR CLEAR 04/26/2017 1840   LABSPEC 1.010 04/26/2017 1840   PHURINE 6.0 04/26/2017 1840   GLUCOSEU NEGATIVE 04/26/2017 1840   HGBUR NEGATIVE 04/26/2017 1840   BILIRUBINUR NEGATIVE 04/26/2017 1840   KETONESUR NEGATIVE 04/26/2017 1840   PROTEINUR NEGATIVE 04/26/2017 1840   NITRITE NEGATIVE 04/26/2017 1840   LEUKOCYTESUR NEGATIVE 04/26/2017 1840   Sepsis Labs: @LABRCNTIP (procalcitonin:4,lacticidven:4)  ) Recent Results (from the past 240 hour(s))  Blood culture (routine x 2)     Status: Abnormal   Collection Time: 05/22/17  5:15 AM  Result Value Ref Range Status   Specimen Description BLOOD RIGHT FOREARM  Final   Special Requests   Final    BOTTLES DRAWN AEROBIC ONLY Blood Culture results may not be optimal due to an inadequate volume of blood received in culture bottles   Culture  Setup Time   Final  GRAM POSITIVE COCCI IN CLUSTERS AEROBIC BOTTLE ONLY CRITICAL RESULT CALLED TO, READ BACK BY AND VERIFIED WITH: BMANCHERIL,PHARMD @1259  05/23/17 BY LHOWARD    Culture STAPHYLOCOCCUS AUREUS (A)  Final   Report Status 05/25/2017 FINAL  Final   Organism ID, Bacteria STAPHYLOCOCCUS AUREUS  Final      Susceptibility   Staphylococcus aureus - MIC*    CIPROFLOXACIN <=0.5 SENSITIVE Sensitive     ERYTHROMYCIN <=0.25 SENSITIVE Sensitive     GENTAMICIN <=0.5 SENSITIVE Sensitive     OXACILLIN 0.5 SENSITIVE Sensitive     TETRACYCLINE <=1 SENSITIVE Sensitive     VANCOMYCIN <=0.5 SENSITIVE Sensitive     TRIMETH/SULFA <=10 SENSITIVE Sensitive     CLINDAMYCIN <=0.25 SENSITIVE Sensitive     RIFAMPIN <=0.5 SENSITIVE Sensitive     Inducible Clindamycin NEGATIVE Sensitive     * STAPHYLOCOCCUS AUREUS  Blood Culture ID Panel (Reflexed)     Status: Abnormal   Collection Time: 05/22/17  5:15 AM  Result Value Ref Range Status   Enterococcus species NOT DETECTED NOT DETECTED Final   Listeria monocytogenes NOT DETECTED NOT DETECTED Final   Staphylococcus species DETECTED (A) NOT DETECTED  Final    Comment: CRITICAL RESULT CALLED TO, READ BACK BY AND VERIFIED WITH: BMANCHERIL,PHARMD @1259  05/23/17 BY LHOWARD    Staphylococcus aureus DETECTED (A) NOT DETECTED Final    Comment: Methicillin (oxacillin) susceptible Staphylococcus aureus (MSSA). Preferred therapy is anti staphylococcal beta lactam antibiotic (Cefazolin or Nafcillin), unless clinically contraindicated. CRITICAL RESULT CALLED TO, READ BACK BY AND VERIFIED WITH: BMANCHERIL,PHARMD @1259  05/23/17 BY LHOWARD    Methicillin resistance NOT DETECTED NOT DETECTED Final   Streptococcus species NOT DETECTED NOT DETECTED Final   Streptococcus agalactiae NOT DETECTED NOT DETECTED Final   Streptococcus pneumoniae NOT DETECTED NOT DETECTED Final   Streptococcus pyogenes NOT DETECTED NOT DETECTED Final   Acinetobacter baumannii NOT DETECTED NOT DETECTED Final   Enterobacteriaceae species NOT DETECTED NOT DETECTED Final   Enterobacter cloacae complex NOT DETECTED NOT DETECTED Final   Escherichia coli NOT DETECTED NOT DETECTED Final   Klebsiella oxytoca NOT DETECTED NOT DETECTED Final   Klebsiella pneumoniae NOT DETECTED NOT DETECTED Final   Proteus species NOT DETECTED NOT DETECTED Final   Serratia marcescens NOT DETECTED NOT DETECTED Final   Haemophilus influenzae NOT DETECTED NOT DETECTED Final   Neisseria meningitidis NOT DETECTED NOT DETECTED Final   Pseudomonas aeruginosa NOT DETECTED NOT DETECTED Final   Candida albicans NOT DETECTED NOT DETECTED Final   Candida glabrata NOT DETECTED NOT DETECTED Final   Candida krusei NOT DETECTED NOT DETECTED Final   Candida parapsilosis NOT DETECTED NOT DETECTED Final   Candida tropicalis NOT DETECTED NOT DETECTED Final  Blood culture (routine x 2)     Status: None   Collection Time: 05/22/17  5:22 AM  Result Value Ref Range Status   Specimen Description BLOOD RIGHT ANTECUBITAL  Final   Special Requests   Final    IN BOTH AEROBIC AND ANAEROBIC BOTTLES Blood Culture adequate volume    Culture NO GROWTH 5 DAYS  Final   Report Status 05/27/2017 FINAL  Final  Culture, blood (routine x 2)     Status: None   Collection Time: 05/23/17  5:55 PM  Result Value Ref Range Status   Specimen Description BLOOD LEFT ANTECUBITAL  Final   Special Requests   Final    BOTTLES DRAWN AEROBIC AND ANAEROBIC Blood Culture adequate volume   Culture NO GROWTH 5 DAYS  Final   Report Status  05/28/2017 FINAL  Final  Culture, blood (routine x 2)     Status: None   Collection Time: 05/23/17  8:12 PM  Result Value Ref Range Status   Specimen Description BLOOD LEFT ANTECUBITAL  Final   Special Requests   Final    BOTTLES DRAWN AEROBIC ONLY Blood Culture adequate volume   Culture NO GROWTH 5 DAYS  Final   Report Status 05/28/2017 FINAL  Final      Studies: No results found.  Scheduled Meds: . enoxaparin (LOVENOX) injection  40 mg Subcutaneous Q24H  . sodium chloride flush  3 mL Intravenous Q12H    Continuous Infusions: . sodium chloride 10 mL/hr at 05/29/17 0608  .  ceFAZolin (ANCEF) IV Stopped (05/30/17 1539)     LOS: 8 days     Briant CedarNkeiruka J Ezenduka, MD [facility]   If 7PM-7AM, please contact night-coverage [email or web address] Password [email or web address] 05/30/2017, 4:49 PM

## 2017-05-31 LAB — CBC WITH DIFFERENTIAL/PLATELET
BASOS ABS: 0.1 10*3/uL (ref 0.0–0.1)
BASOS PCT: 1 %
EOS ABS: 0.5 10*3/uL (ref 0.0–0.7)
EOS PCT: 4 %
HCT: 35.4 % — ABNORMAL LOW (ref 39.0–52.0)
Hemoglobin: 10.9 g/dL — ABNORMAL LOW (ref 13.0–17.0)
Lymphocytes Relative: 23 %
Lymphs Abs: 2.8 10*3/uL (ref 0.7–4.0)
MCH: 27 pg (ref 26.0–34.0)
MCHC: 30.8 g/dL (ref 30.0–36.0)
MCV: 87.6 fL (ref 78.0–100.0)
MONO ABS: 1.2 10*3/uL — AB (ref 0.1–1.0)
Monocytes Relative: 10 %
Neutro Abs: 7.9 10*3/uL — ABNORMAL HIGH (ref 1.7–7.7)
Neutrophils Relative %: 62 %
PLATELETS: 453 10*3/uL — AB (ref 150–400)
RBC: 4.04 MIL/uL — ABNORMAL LOW (ref 4.22–5.81)
RDW: 14 % (ref 11.5–15.5)
WBC: 12.6 10*3/uL — AB (ref 4.0–10.5)

## 2017-05-31 LAB — BASIC METABOLIC PANEL
ANION GAP: 9 (ref 5–15)
BUN: 17 mg/dL (ref 6–20)
CALCIUM: 9.1 mg/dL (ref 8.9–10.3)
CO2: 27 mmol/L (ref 22–32)
Chloride: 100 mmol/L — ABNORMAL LOW (ref 101–111)
Creatinine, Ser: 0.69 mg/dL (ref 0.61–1.24)
GLUCOSE: 93 mg/dL (ref 65–99)
Potassium: 3.5 mmol/L (ref 3.5–5.1)
SODIUM: 136 mmol/L (ref 135–145)

## 2017-05-31 LAB — GLUCOSE, CAPILLARY: GLUCOSE-CAPILLARY: 88 mg/dL (ref 65–99)

## 2017-05-31 NOTE — Progress Notes (Signed)
Physical Therapy Treatment Patient Details Name: Hunter [name] XXXMarquez MRN: [id number] DOB: Jul 17, 1985 Today's Date: 05/31/2017    History of Present Illness Hunter [name] is a 31 y.o. male with medical history significant for IVDA, recent hospitalization for right forearm deep abscess and strep pyogenes bacteremia, hepatitis C who presented to ED with reports of shortness of breath for past 3-4 days prior to the admission associated with chest pain especially when taking a deep breath. MRI showed discitis/osteomyelitis of lumbar spine; Sepsis (HCC) / Osteomyelitis (HCC) / Discitis of lumbar region / Leukocytosis    PT Comments    Pt progresses towards PT goals today, ambulating 550-ft with min guard while holding onto IV pole. Pt reports back pain at beginning of session, however he says that he knows he has to walk to get stronger. 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 Educated pt on long arc quad exercises seated EOB. Current plan remains appropriate. PT will follow acutely in order to maximize mobility and increase level of independence with mobility while in hospital setting.    Follow Up Recommendations  No PT follow up     Equipment Recommendations  None recommended by PT    Recommendations for Other Services       Precautions / Restrictions Restrictions Weight Bearing Restrictions: No    Mobility  Bed Mobility Overal bed mobility: Needs Assistance Bed Mobility: Rolling;Sidelying to Sit Rolling: Supervision Sidelying to sit: Supervision;HOB elevated       General bed mobility comments: Pt uses bed railing for assist in sidelying to sit. Pt grimaces (back pain).   Transfers Overall transfer level: Needs assistance Equipment used: (IV pole) Transfers: Sit to/from Stand Sit to Stand: Min guard         General transfer comment: Pt with LUE on IV pole during rise from EOB. Pt pain  guarded, with slow and stiff rise.   Ambulation/Gait Ambulation/Gait assistance: Min guard Ambulation Distance (Feet): 550 Feet Assistive device: (IV pole) Gait Pattern/deviations: Step-through pattern;Decreased stride length Gait velocity: decreased Gait velocity interpretation: Below normal speed for age/gender General Gait Details: Pt with LUE on IV pole during gait.    Stairs            Wheelchair Mobility    Modified Rankin (Stroke Patients Only)       Balance   Sitting-balance support: Feet supported;No upper extremity supported Sitting balance-Leahy Scale: Fair Sitting balance - Comments: Pt demonstrates free use of UEs while seated EOB, however utilizes UE support for movement due to back pain   Standing balance support: Single extremity supported;During functional activity Standing balance-Leahy Scale: Fair Standing balance comment: Pt able to stand statically without external support, however holds on to object (IV pole today) for safety with dynamic movement                            Cognition Arousal/Alertness: Awake/alert Behavior During Therapy: WFL for tasks assessed/performed Overall Cognitive Status: Within Functional Limits for tasks assessed                                        Exercises      General Comments General comments (skin integrity, edema, etc.): Pt notes that sitting in chair increases his back pain, so he prefers sitting EOB.  Pt reports back pain increases with just slight bending, so he is forced to stand up and sit up straight.      Pertinent Vitals/Pain Pain Assessment: 0-10 Pain Score: 8  Pain Location: Back pain Pain Descriptors / Indicators: Constant;Grimacing;Guarding;Discomfort Pain Intervention(s): Monitored during session;Limited activity within patient's tolerance    Home Living                      Prior Function            PT Goals (current goals can now be found in the  care plan section) Progress towards PT goals: Progressing toward goals    Frequency    Min 3X/week      PT Plan Current plan remains appropriate    Co-evaluation              AM-PAC PT "6 Clicks" Daily Activity  Outcome Measure  Difficulty turning over in bed (including adjusting bedclothes, sheets and blankets)?: A Little Difficulty moving from lying on back to sitting on the side of the bed? : A Little Difficulty sitting down on and standing up from a chair with arms (e.g., wheelchair, bedside commode, etc,.)?: A Little Help needed moving to and from a bed to chair (including a wheelchair)?: A Little Help needed walking in hospital room?: A Little Help needed climbing 3-5 steps with a railing? : A Little 6 Click Score: 18    End of Session Equipment Utilized During Treatment: Gait belt Activity Tolerance: Patient tolerated treatment well;Patient limited by pain Patient left: with call bell/phone within reach;in bed   PT Visit Diagnosis: Unsteadiness on feet (R26.81);Other abnormalities of gait and mobility (R26.89);Pain Pain - part of body: (back)     Time: [id number] PT Time Calculation (min) (ACUTE ONLY): 16 min  Charges:  $Gait Training: 8-22 mins                    G Codes:       Reina Fuserin Leger, SPT   Willowdean Luhmann A Kattaleya Alia 05/31/2017, 10:55 AM

## 2017-05-31 NOTE — Progress Notes (Signed)
PROGRESS NOTE  Hunter [name] WUJ:[id number]:[id number] DOB: 1985/08/28 DOA: 05/22/2017 PCP: Patient, No Pcp Per  HPI/Recap of past 24 hours: Hunter [name] is a 31 y.o. male with history of IV drug use, prior hand infection and group A streptococcal bacteremia, status post surgery in November discharged on Augmentin which he stopped taking now admitted with MSSA bacteremia and sepsis, lumbar disc infection with psoas abscess  Today, pt denies any new complaints, reports resolving back pain, denies any chest pain, SOB, abdominal pain, N/V/D/C, fever/chills.  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 Right greater than left paravertebral soft tissue inflammation with extensive right psoas inflammation/myositis. 20 x 6 mm right psoas abscess. Diffuse posterior paraspinal muscle inflammation in the lumbar spine suggestive of myositis. No evidence of infection in the cervical or thoracic spine. No epidural abscess. 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  No heroin or opiates in a long time.   [place] with Dad, who he thinks would be willing to take him in (he got clean before while living with his dad).  He was in recovery for almost a year not that long ago here in [place], went to IOP at [facility]. Was on methadone at that time. Never did NA/AA before.  Also did [place] 18-day inpatient detox in [place].  -Appreciate Dr. Jacqualin CombesVincent's assistance with possibly starting suboxone when pain is better controlled in 1-2 weeks  #HCV + AB, Genotype 2b  HCV RNA 6.547, hep c quantitative 3,520, 000 ID on board, app recs  #Normocytic anemia Stable, baseline Hgb 11s  #Hypokalemia Resolved    Code Status: Full  Family Communication: None at bedside   Disposition Plan: Long term AB, SNF Vs most likely inpatient. IVDU cant go home with PICC line   Consultants:  ID  Cardiology for TEE  Neurosurgery  IMTS for suboxone  Procedures:  TEE   Antimicrobials:  IV Cefazolin  DVT prophylaxis:  Lovenox   Objective: Vitals:   05/30/17 2209 05/31/17 0250 05/31/17 0545 05/31/17 1457  BP: 119/68  116/78 117/66  Pulse: 83  82 84  Resp: 18  16   Temp: 97.9 F (36.6 C)  98.1 F (36.7 C) 97.8 F (36.6 C)  TempSrc: Oral  Oral Oral  SpO2: 100%  100% 100%  Weight:  74 kg (163 lb 1.6 oz)    Height:       No intake or output data in the 24 hours ending 05/31/17 1843 Filed  Weights   05/29/17 0500 05/30/17 0500 05/31/17 0250  Weight: 73.5 kg (162 lb 0.6 oz) 75.9 kg (167 lb 5.3 oz) 74 kg (163 lb 1.6 oz)    Exam:   General: Alert, awake, oriented x3, multiple tattoos noted  Cardiovascular: S1-S2 present, no added heart sounds  Respiratory: Chest clear bilaterally  Abdomen: Soft, nontender, nondistended, bowel sounds present  Musculoskeletal: Tenderness in the lower left paraspinal muscles  Skin: Multiple  tattoos noted, no rash  Psychiatry: Normal mood   Data Reviewed: CBC: Recent Labs  Lab 05/25/17 0420 05/26/17 0305 05/31/17 0328  WBC 11.7* 10.8* 12.6*  NEUTROABS  --   --  7.9*  HGB 9.7* 10.0* 10.9*  HCT 30.6* 31.8* 35.4*  MCV 86.2 85.9 87.6  PLT 491* 508* 453*   Basic Metabolic Panel: Recent Labs  Lab 05/25/17 0420 05/26/17 0305 05/28/17 0550 05/29/17 0511 05/31/17 0328  NA 137 137 134*  --  136  K 4.3 3.9 4.2  --  3.5  CL 103 100* 100*  --  100*  CO2 27 27 25   --  27  GLUCOSE 104* 97 96  --  93  BUN 6 9 11   --  17  CREATININE 0.57* 0.52* 0.55* 0.65 0.69  CALCIUM 8.5* 9.0 8.9  --  9.1  MG  --  1.9  --   --   --    GFR: Estimated Creatinine Clearance: 140 mL/min (by C-G formula based on SCr of 0.69 mg/dL). Liver Function Tests: No results for input(s): AST, ALT, ALKPHOS, BILITOT, PROT, ALBUMIN in the last 168 hours. No results for input(s): LIPASE, AMYLASE in the last 168 hours. No results for input(s): AMMONIA in the last 168 hours. Coagulation Profile: Recent Labs  Lab 05/28/17 0550  INR 1.10   Cardiac Enzymes: No results for input(s): CKTOTAL, CKMB, CKMBINDEX, TROPONINI in the last 168 hours. BNP (last 3 results) No results for input(s): PROBNP in the last 8760 hours. HbA1C: No results for input(s): HGBA1C in the last 72 hours. CBG: Recent Labs  Lab 05/29/17 0845 05/30/17 0747 05/30/17 1201 05/30/17 1616 05/31/17 0743  GLUCAP 209* 107* 101* 266* 88   Lipid Profile: No results for input(s): CHOL, HDL, LDLCALC, TRIG, CHOLHDL, LDLDIRECT in the last 72 hours. Thyroid Function Tests: No results for input(s): TSH, T4TOTAL, FREET4, T3FREE, THYROIDAB in the last 72 hours. Anemia Panel: No results for input(s): VITAMINB12, FOLATE, FERRITIN, TIBC, IRON, RETICCTPCT in the last 72 hours. Urine analysis:    Component Value Date/Time   COLORURINE YELLOW 04/26/2017 1840   APPEARANCEUR CLEAR 04/26/2017 1840   LABSPEC 1.010 04/26/2017 1840   PHURINE 6.0  04/26/2017 1840   GLUCOSEU NEGATIVE 04/26/2017 1840   HGBUR NEGATIVE 04/26/2017 1840   BILIRUBINUR NEGATIVE 04/26/2017 1840   KETONESUR NEGATIVE 04/26/2017 1840   PROTEINUR NEGATIVE 04/26/2017 1840   NITRITE NEGATIVE 04/26/2017 1840   LEUKOCYTESUR NEGATIVE 04/26/2017 1840   Sepsis Labs: @LABRCNTIP (procalcitonin:4,lacticidven:4)  ) Recent Results (from the past 240 hour(s))  Blood culture (routine x 2)     Status: Abnormal   Collection Time: 05/22/17  5:15 AM  Result Value Ref Range Status   Specimen Description BLOOD RIGHT FOREARM  Final   Special Requests   Final    BOTTLES DRAWN AEROBIC ONLY Blood Culture results may not be optimal due to an inadequate volume of blood received in culture bottles   Culture  Setup Time   Final    GRAM POSITIVE COCCI IN CLUSTERS AEROBIC BOTTLE ONLY CRITICAL RESULT  CALLED TO, READ BACK BY AND VERIFIED WITH: BMANCHERIL,PHARMD @1259  05/23/17 BY LHOWARD    Culture STAPHYLOCOCCUS AUREUS (A)  Final   Report Status 05/25/2017 FINAL  Final   Organism ID, Bacteria STAPHYLOCOCCUS AUREUS  Final      Susceptibility   Staphylococcus aureus - MIC*    CIPROFLOXACIN <=0.5 SENSITIVE Sensitive     ERYTHROMYCIN <=0.25 SENSITIVE Sensitive     GENTAMICIN <=0.5 SENSITIVE Sensitive     OXACILLIN 0.5 SENSITIVE Sensitive     TETRACYCLINE <=1 SENSITIVE Sensitive     VANCOMYCIN <=0.5 SENSITIVE Sensitive     TRIMETH/SULFA <=10 SENSITIVE Sensitive     CLINDAMYCIN <=0.25 SENSITIVE Sensitive     RIFAMPIN <=0.5 SENSITIVE Sensitive     Inducible Clindamycin NEGATIVE Sensitive     * STAPHYLOCOCCUS AUREUS  Blood Culture ID Panel (Reflexed)     Status: Abnormal   Collection Time: 05/22/17  5:15 AM  Result Value Ref Range Status   Enterococcus species NOT DETECTED NOT DETECTED Final   Listeria monocytogenes NOT DETECTED NOT DETECTED Final   Staphylococcus species DETECTED (A) NOT DETECTED Final    Comment: CRITICAL RESULT CALLED TO, READ BACK BY AND VERIFIED  WITH: BMANCHERIL,PHARMD @1259  05/23/17 BY LHOWARD    Staphylococcus aureus DETECTED (A) NOT DETECTED Final    Comment: Methicillin (oxacillin) susceptible Staphylococcus aureus (MSSA). Preferred therapy is anti staphylococcal beta lactam antibiotic (Cefazolin or Nafcillin), unless clinically contraindicated. CRITICAL RESULT CALLED TO, READ BACK BY AND VERIFIED WITH: BMANCHERIL,PHARMD @1259  05/23/17 BY LHOWARD    Methicillin resistance NOT DETECTED NOT DETECTED Final   Streptococcus species NOT DETECTED NOT DETECTED Final   Streptococcus agalactiae NOT DETECTED NOT DETECTED Final   Streptococcus pneumoniae NOT DETECTED NOT DETECTED Final   Streptococcus pyogenes NOT DETECTED NOT DETECTED Final   Acinetobacter baumannii NOT DETECTED NOT DETECTED Final   Enterobacteriaceae species NOT DETECTED NOT DETECTED Final   Enterobacter cloacae complex NOT DETECTED NOT DETECTED Final   Escherichia coli NOT DETECTED NOT DETECTED Final   Klebsiella oxytoca NOT DETECTED NOT DETECTED Final   Klebsiella pneumoniae NOT DETECTED NOT DETECTED Final   Proteus species NOT DETECTED NOT DETECTED Final   Serratia marcescens NOT DETECTED NOT DETECTED Final   Haemophilus influenzae NOT DETECTED NOT DETECTED Final   Neisseria meningitidis NOT DETECTED NOT DETECTED Final   Pseudomonas aeruginosa NOT DETECTED NOT DETECTED Final   Candida albicans NOT DETECTED NOT DETECTED Final   Candida glabrata NOT DETECTED NOT DETECTED Final   Candida krusei NOT DETECTED NOT DETECTED Final   Candida parapsilosis NOT DETECTED NOT DETECTED Final   Candida tropicalis NOT DETECTED NOT DETECTED Final  Blood culture (routine x 2)     Status: None   Collection Time: 05/22/17  5:22 AM  Result Value Ref Range Status   Specimen Description BLOOD RIGHT ANTECUBITAL  Final   Special Requests   Final    IN BOTH AEROBIC AND ANAEROBIC BOTTLES Blood Culture adequate volume   Culture NO GROWTH 5 DAYS  Final   Report Status 05/27/2017 FINAL   Final  Culture, blood (routine x 2)     Status: None   Collection Time: 05/23/17  5:55 PM  Result Value Ref Range Status   Specimen Description BLOOD LEFT ANTECUBITAL  Final   Special Requests   Final    BOTTLES DRAWN AEROBIC AND ANAEROBIC Blood Culture adequate volume   Culture NO GROWTH 5 DAYS  Final   Report Status 05/28/2017 FINAL  Final  Culture, blood (routine x 2)  Status: None   Collection Time: 05/23/17  8:12 PM  Result Value Ref Range Status   Specimen Description BLOOD LEFT ANTECUBITAL  Final   Special Requests   Final    BOTTLES DRAWN AEROBIC ONLY Blood Culture adequate volume   Culture NO GROWTH 5 DAYS  Final   Report Status 05/28/2017 FINAL  Final      Studies: No results found.  Scheduled Meds: . enoxaparin (LOVENOX) injection  40 mg Subcutaneous Q24H  . sodium chloride flush  3 mL Intravenous Q12H    Continuous Infusions: . sodium chloride 10 mL/hr at 05/29/17 0608  .  ceFAZolin (ANCEF) IV Stopped (05/31/17 1559)     LOS: 9 days     Briant Cedar, MD [facility]   If 7PM-7AM, please contact night-coverage [email or web address] Password [facility] 05/31/2017, 6:43 PM

## 2017-06-01 LAB — GLUCOSE, CAPILLARY: GLUCOSE-CAPILLARY: 101 mg/dL — AB (ref 65–99)

## 2017-06-01 NOTE — Progress Notes (Signed)
PROGRESS NOTE  Hunter [name] WUJ:[id number] DOB: Jul 16, 1985 DOA: 05/22/2017 PCP: Patient, No Pcp Per  HPI/Recap of past 24 hours: Hunter [name] is a 31 y.o. male with history of IV drug use, prior hand infection and group A streptococcal bacteremia, status post surgery in November discharged on Augmentin which he stopped taking now admitted with MSSA bacteremia and sepsis, lumbar disc infection with psoas abscess  Today, pt denies any new complaints, reports resolving back pain, denies any chest pain, SOB, abdominal pain, N/V/D/C, fever/chills.  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 Right greater than left paravertebral soft tissue inflammation with extensive right psoas inflammation/myositis. 20 x 6 mm right psoas abscess. Diffuse posterior paraspinal muscle inflammation in the lumbar spine suggestive of myositis. No evidence of infection in the cervical or thoracic spine. No epidural abscess. 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   No heroin or opiates in a long time.   Patient at present planning to move to [facility] to live with Dad, who he thinks would be willing to take him in (he got clean before while living with his dad).  He was in recovery for almost a year not that long ago here in [place], went to IOP at [facility]. Was on methadone at that time. Never did NA/AA before.  Also did an 18-day inpatient detox in [place], [place] once.  -Appreciate Dr. Jacqualin Combes assistance with possibly starting suboxone when pain is better controlled in 1-2 weeks  #HCV + AB, Genotype 2b  HCV RNA 6.547, hep c quantitative 3,520, 000 ID on board, app recs  #Normocytic anemia Stable, baseline Hgb 11s  #Hypokalemia Resolved    Code Status: Full  Family Communication: None at bedside   Disposition Plan: Long term AB, SNF Vs most likely inpatient. IVDU cant go home with PICC line   Consultants:  ID  Cardiology for TEE  Neurosurgery  IMTS for suboxone  Procedures:  TEE   Antimicrobials:  IV Cefazolin  DVT prophylaxis:  Lovenox   Objective: Vitals:   05/31/17 1457 05/31/17 2225 06/01/17 0534 06/01/17 1426  BP: 117/66 111/68 116/70 122/62  Pulse: 84 83 77 100  Resp:  18 18 18   Temp: 97.8 F (36.6 C) 98.4 F (36.9 C) 98.6 F (37 C) 98.9 F (37.2 C)  TempSrc: Oral Oral Oral Oral  SpO2: 100% 99% 99% 100%  Weight:   75.5 kg (166 lb 7.2 oz)   Height:        Intake/Output Summary (Last 24 hours) at 06/01/2017 1431 Last data filed at 06/01/2017 1425 Gross per 24 hour  Intake 1535.5 ml  Output -  Net 1535.5 ml   Filed Weights   05/30/17 0500 05/31/17 0250 06/01/17 0534  Weight: 75.9 kg (167 lb 5.3 oz) 74 kg (163 lb 1.6 oz) 75.5 kg (166 lb 7.2 oz)    Exam:   General: Alert, awake, oriented x3, multiple tattoos noted  Cardiovascular: S1-S2 present, no added heart sounds  Respiratory: Chest clear bilaterally  Abdomen: Soft, nontender, nondistended, bowel sounds present  Musculoskeletal: No pedal  edema bilaterally  Skin: Multiple tattoos noted, no rash  Psychiatry: Normal mood   Data Reviewed: CBC: Recent Labs  Lab 05/26/17 0305 05/31/17 0328  WBC 10.8* 12.6*  NEUTROABS  --  7.9*  HGB 10.0* 10.9*  HCT 31.8* 35.4*  MCV 85.9 87.6  PLT 508* 453*   Basic Metabolic Panel: Recent Labs  Lab 05/26/17 0305 05/28/17 0550 05/29/17 0511 05/31/17 0328  NA 137 134*  --  136  K 3.9 4.2  --  3.5  CL 100* 100*  --  100*  CO2 27 25  --  27  GLUCOSE 97 96  --  93  BUN 9 11  --  17  CREATININE 0.52* 0.55* 0.65 0.69  CALCIUM 9.0 8.9  --  9.1  MG 1.9  --   --   --    GFR: Estimated Creatinine Clearance: 142.9 mL/min (by C-G formula based on SCr of 0.69 mg/dL). Liver Function Tests: No results for input(s): AST, ALT, ALKPHOS, BILITOT, PROT, ALBUMIN in the last 168 hours. No results for input(s): LIPASE, AMYLASE in the last 168 hours. No results for input(s): AMMONIA in the last 168 hours. Coagulation Profile: Recent Labs  Lab 05/28/17 0550  INR 1.10   Cardiac Enzymes: No results for input(s): CKTOTAL, CKMB, CKMBINDEX, TROPONINI in the last 168 hours. BNP (last 3 results) No results for input(s): PROBNP in the last 8760 hours. HbA1C: No results for input(s): HGBA1C in the last 72 hours. CBG: Recent Labs  Lab 05/30/17 0747 05/30/17 1201 05/30/17 1616 05/31/17 0743 06/01/17 0751  GLUCAP 107* 101* 266* 88 101*   Lipid Profile: No results for input(s): CHOL, HDL, LDLCALC, TRIG, CHOLHDL, LDLDIRECT in the last 72 hours. Thyroid Function Tests: No results for input(s): TSH, T4TOTAL, FREET4, T3FREE, THYROIDAB in the last 72 hours. Anemia Panel: No results for input(s): VITAMINB12, FOLATE, FERRITIN, TIBC, IRON, RETICCTPCT in the last 72 hours. Urine analysis:    Component Value Date/Time   COLORURINE YELLOW 04/26/2017 1840   APPEARANCEUR CLEAR 04/26/2017 1840   LABSPEC 1.010 04/26/2017 1840   PHURINE 6.0 04/26/2017 1840   GLUCOSEU NEGATIVE 04/26/2017 1840   HGBUR  NEGATIVE 04/26/2017 1840   BILIRUBINUR NEGATIVE 04/26/2017 1840   KETONESUR NEGATIVE 04/26/2017 1840   PROTEINUR NEGATIVE 04/26/2017 1840   NITRITE NEGATIVE 04/26/2017 1840   LEUKOCYTESUR NEGATIVE 04/26/2017 1840   Sepsis Labs: @LABRCNTIP (procalcitonin:4,lacticidven:4)  ) Recent Results (from the past 240 hour(s))  Culture, blood (routine x 2)     Status: None   Collection Time: 05/23/17  5:55 PM  Result Value Ref Range Status   Specimen Description BLOOD LEFT ANTECUBITAL  Final   Special Requests   Final    BOTTLES DRAWN AEROBIC AND ANAEROBIC Blood Culture adequate volume   Culture NO GROWTH 5 DAYS  Final   Report Status 05/28/2017 FINAL  Final  Culture, blood (routine x 2)     Status: None   Collection Time: 05/23/17  8:12 PM  Result Value Ref Range Status   Specimen Description BLOOD  LEFT ANTECUBITAL  Final   Special Requests   Final    BOTTLES DRAWN AEROBIC ONLY Blood Culture adequate volume   Culture NO GROWTH 5 DAYS  Final   Report Status 05/28/2017 FINAL  Final      Studies: No results found.  Scheduled Meds: . enoxaparin (LOVENOX) injection  40 mg Subcutaneous Q24H  . sodium chloride flush  3 mL Intravenous Q12H    Continuous Infusions: . sodium chloride 10 mL/hr at 05/29/17 0608  .  ceFAZolin (ANCEF) IV 2 g (06/01/17 1417)     LOS: 10 days     Briant CedarNkeiruka J Chi Woodham, MD [facility]   If 7PM-7AM, please contact night-coverage [email or web address] Password [email or web address] 06/01/2017, 2:31 PM

## 2017-06-02 NOTE — Progress Notes (Addendum)
Physical Therapy Treatment Patient Details Name: Hunter MountsDavid Anthony Shea MRN: [id number] DOB: 1985-12-08 Today's Date: 06/02/2017    History of Present Illness Hunter [name] is a 31 y.o. male with medical history significant for IVDA, recent hospitalization for right forearm deep abscess and strep pyogenes bacteremia, hepatitis C who presented to ED with reports of shortness of breath for past 3-4 days prior to the admission associated with chest pain especially when taking a deep breath. MRI showed discitis/osteomyelitis of lumbar spine; Sepsis (HCC) / Osteomyelitis (HCC) / Discitis of lumbar region / Leukocytosis    PT Comments    Patient overall supervision level for OOB mobility and tolerated ambulating 4400ft without AD. Pt tolerated balance activities without LOB. Continues to demonstrate decreased gait speed and stride length and with c/o increased back pain with increased distance. Pt however did report back pain continues to improve. Continue to progress as tolerated.    Follow Up Recommendations  No PT follow up     Equipment Recommendations  None recommended by PT    Recommendations for Other Services OT consult(ordered per protocol)     Precautions / Restrictions Precautions Precautions: Back Precaution Comments: Back precautions for comfort Restrictions Weight Bearing Restrictions: No    Mobility  Bed Mobility Overal bed mobility: Modified Independent Bed Mobility: Rolling;Sidelying to Sit           General bed mobility comments: increased time and effort; use of rail; good demonstration of log roll  Transfers Overall transfer level: Needs assistance   Transfers: Sit to/from Stand Sit to Stand: Supervision         General transfer comment: supervision for safety; pt with guarded movements when standing due to back pain  Ambulation/Gait Ambulation/Gait assistance: Supervision Ambulation Distance (Feet): 400 Feet Assistive device: None Gait  Pattern/deviations: Step-through pattern;Decreased stride length Gait velocity: decreased   General Gait Details: pt was able to ambulate without UE support and tolerated challenges like head turns and directional changes without gait disturbances; pt c/o increased back pain with distance   Stairs            Wheelchair Mobility    Modified Rankin (Stroke Patients Only)       Balance Overall balance assessment: Modified Independent;No apparent balance deficits (not formally assessed) Sitting-balance support: Feet supported;No upper extremity supported Sitting balance-Leahy Scale: Good     Standing balance support: No upper extremity supported;During functional activity Standing balance-Leahy Scale: Good                              Cognition Arousal/Alertness: Awake/alert Behavior During Therapy: WFL for tasks assessed/performed Overall Cognitive Status: Within Functional Limits for tasks assessed                                        Exercises      General Comments        Pertinent Vitals/Pain Pain Assessment: Faces Faces Pain Scale: Hurts little more Pain Location: Back pain Pain Descriptors / Indicators: Guarding;Aching Pain Intervention(s): Limited activity within patient's tolerance;Monitored during session;Repositioned    Home Living                      Prior Function            PT Goals (current goals can now be found in the care plan  section) Acute Rehab PT Goals PT Goal Formulation: With patient Time For Goal Achievement: 06/06/17 Potential to Achieve Goals: Good Progress towards PT goals: Progressing toward goals    Frequency    Min 3X/week      PT Plan Current plan remains appropriate    Co-evaluation              AM-PAC PT "6 Clicks" Daily Activity  Outcome Measure  Difficulty turning over in bed (including adjusting bedclothes, sheets and blankets)?: A Little Difficulty moving from  lying on back to sitting on the side of the bed? : A Little Difficulty sitting down on and standing up from a chair with arms (e.g., wheelchair, bedside commode, etc,.)?: A Little Help needed moving to and from a bed to chair (including a wheelchair)?: None Help needed walking in hospital room?: None Help needed climbing 3-5 steps with a railing? : A Little 6 Click Score: 20    End of Session Equipment Utilized During Treatment: Gait belt Activity Tolerance: Patient tolerated treatment well Patient left: with call bell/phone within reach;Other (comment)(pt in bathroom end of session) Nurse Communication: Mobility status PT Visit Diagnosis: Unsteadiness on feet (R26.81);Other abnormalities of gait and mobility (R26.89);Pain Pain - part of body: (back)     Time: [id number] PT Time Calculation (min) (ACUTE ONLY): 13 min  Charges:  $Gait Training: 8-22 mins                    G Codes:       Erline LevineKellyn Avya Flavell, PTA Pager: [phone]     Hunter [name] 06/02/2017, 1:13 PM

## 2017-06-02 NOTE — Progress Notes (Signed)
PROGRESS NOTE  Hunter [name] WUJ:[id number] DOB: [birth year] DOA: 05/22/2017 PCP: Patient, No Pcp Per  HPI/Recap of past 24 hours: Hunter [name] is a 31 y.o. male with history of IV drug use, prior hand infection and group A streptococcal bacteremia, status post surgery in November discharged on Augmentin which he stopped taking now admitted with MSSA bacteremia and sepsis, lumbar disc infection with psoas abscess  Today, pt denies any new complaints, reports resolving back pain, denies any chest pain, SOB, abdominal pain, N/V/D/C, fever/chills.  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 Right greater than left paravertebral soft tissue inflammation with extensive right psoas inflammation/myositis. 20 x 6 mm right psoas abscess. Diffuse posterior paraspinal muscle inflammation in the lumbar spine suggestive of myositis. No evidence of infection in the cervical or thoracic spine. No epidural abscess. 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  No heroin or  opiates in a long time.   [place] with Dad, who he thinks would be willing to take him in (he got clean before while living with his dad).  He was in recovery for almost a year not that long ago here in [place], went to IOP at [facility]. Was on methadone at that time. Never did NA/AA before.  Also did [place] 18-day inpatient detox in [place].  -Appreciate Dr. Jacqualin CombesVincent's assistance with possibly starting suboxone when pain is better controlled in 1-2 weeks  #HCV + AB, Genotype 2b  HCV RNA 6.547, hep c quantitative 3,520, 000 ID on board, app recs  #Normocytic anemia Stable, baseline Hgb 11s  #Hypokalemia Resolved    Code Status: Full  Family Communication: None at bedside   Disposition Plan: Long term AB, SNF Vs most likely inpatient.  IVDU cant go home with PICC line   Consultants:  ID  Cardiology for TEE  Neurosurgery  IMTS for suboxone  Procedures:  TEE   Antimicrobials:  IV Cefazolin  DVT prophylaxis:  Lovenox   Objective: Vitals:   06/01/17 2205 06/02/17 0500 06/02/17 0610 06/02/17 0628  BP: (!) 117/59   108/64  Pulse: (!) 101   72  Resp: 20   12  Temp: 99.2 F (37.3 C)   98.7 F (37.1 C)  TempSrc: Oral     SpO2: 99%   97%  Weight:  75.5 kg (166 lb 7.2 oz) 74.8 kg (165 lb)   Height:        Intake/Output Summary (Last 24 hours) at 06/02/2017 1434 Last data filed at 06/02/2017 0649 Gross per 24 hour  Intake 1004  ml  Output -  Net 1004 ml   Filed Weights   06/01/17 0534 06/02/17 0500 06/02/17 0610  Weight: 75.5 kg (166 lb 7.2 oz) 75.5 kg (166 lb 7.2 oz) 74.8 kg (165 lb)    Exam:   General: Alert, awake, oriented x3, multiple tattoos noted  Cardiovascular: S1-S2 present, no added heart sounds  Respiratory: Chest clear bilaterally  Abdomen: Soft, nontender, nondistended, bowel sounds present  Musculoskeletal: No pedal edema bilaterally  Skin: Multiple tattoos noted, no  rash  Psychiatry: Normal mood   Data Reviewed: CBC: Recent Labs  Lab 05/31/17 0328  WBC 12.6*  NEUTROABS 7.9*  HGB 10.9*  HCT 35.4*  MCV 87.6  PLT 453*   Basic Metabolic Panel: Recent Labs  Lab 05/28/17 0550 05/29/17 0511 05/31/17 0328  NA 134*  --  136  K 4.2  --  3.5  CL 100*  --  100*  CO2 25  --  27  GLUCOSE 96  --  93  BUN 11  --  17  CREATININE 0.55* 0.65 0.69  CALCIUM 8.9  --  9.1   GFR: Estimated Creatinine Clearance: 141.5 mL/min (by C-G formula based on SCr of 0.69 mg/dL). Liver Function Tests: No results for input(s): AST, ALT, ALKPHOS, BILITOT, PROT, ALBUMIN in the last 168 hours. No results for input(s): LIPASE, AMYLASE in the last 168 hours. No results for input(s): AMMONIA in the last 168 hours. Coagulation Profile: Recent Labs  Lab 05/28/17 0550  INR 1.10   Cardiac Enzymes: No results for input(s): CKTOTAL, CKMB, CKMBINDEX, TROPONINI in the last 168 hours. BNP (last 3 results) No results for input(s): PROBNP in the last 8760 hours. HbA1C: No results for input(s): HGBA1C in the last 72 hours. CBG: Recent Labs  Lab 05/30/17 0747 05/30/17 1201 05/30/17 1616 05/31/17 0743 06/01/17 0751  GLUCAP 107* 101* 266* 88 101*   Lipid Profile: No results for input(s): CHOL, HDL, LDLCALC, TRIG, CHOLHDL, LDLDIRECT in the last 72 hours. Thyroid Function Tests: No results for input(s): TSH, T4TOTAL, FREET4, T3FREE, THYROIDAB in the last 72 hours. Anemia Panel: No results for input(s): VITAMINB12, FOLATE, FERRITIN, TIBC, IRON, RETICCTPCT in the last 72 hours. Urine analysis:    Component Value Date/Time   COLORURINE YELLOW 04/26/2017 1840   APPEARANCEUR CLEAR 04/26/2017 1840   LABSPEC 1.010 04/26/2017 1840   PHURINE 6.0 04/26/2017 1840   GLUCOSEU NEGATIVE 04/26/2017 1840   HGBUR NEGATIVE 04/26/2017 1840   BILIRUBINUR NEGATIVE 04/26/2017 1840   KETONESUR NEGATIVE 04/26/2017 1840   PROTEINUR NEGATIVE 04/26/2017 1840   NITRITE NEGATIVE 04/26/2017  1840   LEUKOCYTESUR NEGATIVE 04/26/2017 1840   Sepsis Labs: @LABRCNTIP (procalcitonin:4,lacticidven:4)  ) Recent Results (from the past 240 hour(s))  Culture, blood (routine x 2)     Status: None   Collection Time: 05/23/17  5:55 PM  Result Value Ref Range Status   Specimen Description BLOOD LEFT ANTECUBITAL  Final   Special Requests   Final    BOTTLES DRAWN AEROBIC AND ANAEROBIC Blood Culture adequate volume   Culture NO GROWTH 5 DAYS  Final   Report Status 05/28/2017 FINAL  Final  Culture, blood (routine x 2)     Status: None   Collection Time: 05/23/17  8:12 PM  Result Value Ref Range Status   Specimen Description BLOOD LEFT ANTECUBITAL  Final   Special Requests   Final    BOTTLES DRAWN AEROBIC ONLY Blood Culture adequate volume   Culture NO GROWTH 5 DAYS  Final   Report Status 05/28/2017  FINAL  Final      Studies: No results found.  Scheduled Meds: . enoxaparin (LOVENOX) injection  40 mg Subcutaneous Q24H  . sodium chloride flush  3 mL Intravenous Q12H    Continuous Infusions: . sodium chloride 10 mL/hr at 05/29/17 0608  .  ceFAZolin (ANCEF) IV 2 g (06/02/17 1422)     LOS: 11 days     Briant Cedar, MD [facility]   If 7PM-7AM, please contact night-coverage [email or web address] Password [facility] 06/02/2017, 2:34 PM

## 2017-06-02 NOTE — Progress Notes (Signed)
OT Treatment Note  Pt has made significant improvement. Pt overall set up to S for ADL, although he  Reports LB dressing is difficult. Pt to remain in hospital at this time. Encouraged pt to complete his own ADL tasks using compensatory techniques and ambulate with staff. If AE needed for LB ADL, will issue prior to DC. Pt verbalized understanding.     06/02/17 1358  OT Visit Information  Last OT Received On 06/02/17  Assistance Needed +1  History of Present Illness Hunter [name] is a 31 y.o. male with medical history significant for IVDA, recent hospitalization for right forearm deep abscess and strep pyogenes bacteremia, hepatitis C who presented to ED with reports of shortness of breath for past 3-4 days prior to the admission associated with chest pain especially when taking a deep breath. MRI showed discitis/osteomyelitis of lumbar spine; Sepsis ([place]) / Osteomyelitis ([place]) / Discitis of lumbar region / Leukocytosis  Precautions  Precautions None  Pain Assessment  Pain Assessment Faces  Faces Pain Scale 2  Pain Location Back pain  Pain Descriptors / Indicators Aching  Pain Intervention(s) Limited activity within patient's tolerance  Cognition  Arousal/Alertness Awake/alert  Behavior During Therapy WFL for tasks assessed/performed  Overall Cognitive Status Within Functional Limits for tasks assessed  ADL  Overall ADL's  Needs assistance/impaired  Functional mobility during ADLs Supervision/safety  General ADL Comments Completed educated regarding compensatory techniques for LB ADL; Pt states he is able to complete LB dressing and bathing at bed level - encouraged pt to complete at bed level. Discussed need to continue to wok on ADL tasks although OT is signing off and that if AE is needed prior to DC, OT can assist with appropriate AE. Pt verbalized understanding.   Bed Mobility  Overal bed mobility Modified Independent  Balance  Overall balance assessment Modified Independent   Transfers  Overall transfer level Modified independent  OT - End of Session  Activity Tolerance Patient tolerated treatment well  Patient left in bed;with call bell/phone within reach  Nurse Communication Mobility status  OT Assessment/Plan  OT Plan All goals met and education completed, patient discharged from OT services  OT Visit Diagnosis Unsteadiness on feet (R26.81);Other abnormalities of gait and mobility (R26.89);Pain  Pain - part of body (back)  Follow Up Recommendations No OT follow up  OT Equipment None recommended by OT  AM-PAC OT "6 Clicks" Daily Activity Outcome Measure  Help from another person eating meals? 4  Help from another person taking care of personal grooming? 4  Help from another person toileting, which includes using toliet, bedpan, or urinal? 4  Help from another person bathing (including washing, rinsing, drying)? 4  Help from another person to put on and taking off regular upper body clothing? 4  Help from another person to put on and taking off regular lower body clothing? 3  6 Click Score 23  ADL G Code Conversion CI  OT Goal Progression  Progress towards OT goals Goals met/education completed, patient discharged from OT (OT to assess for AE closer to DC)  Acute Rehab OT Goals  Patient Stated Goal to stay clean  OT Goal Formulation All assessment and education complete, DC therapy  ADL Goals  Pt Will Perform Toileting - Clothing Manipulation and hygiene with supervision;sit to/from stand  Pt Will Perform Grooming with supervision;with set-up;standing  Pt Will Perform Lower Body Dressing with supervision;with set-up;with adaptive equipment;sit to/from stand  Pt Will Transfer to Toilet with supervision;ambulating;bedside commode  Additional ADL Goal #  1 pt will be Independent for OOB for basic ADLs  OT Time Calculation  OT Start Time (ACUTE ONLY) 1242  OT Stop Time (ACUTE ONLY) 1252  OT Time Calculation (min) 10 min  OT General Charges  $OT Visit 1  Visit  OT Treatments  $Self Care/Home Management  8-22 mins  [facility], OT/L  [phone] 06/02/2017

## 2017-06-03 LAB — GLUCOSE, CAPILLARY
GLUCOSE-CAPILLARY: 98 mg/dL (ref 65–99)
Glucose-Capillary: 104 mg/dL — ABNORMAL HIGH (ref 65–99)

## 2017-06-03 NOTE — Progress Notes (Signed)
PROGRESS NOTE  Hunter [name] JYN:[id number] DOB: December 23, 1985 DOA: 05/22/2017 PCP: Patient, No Pcp Per  HPI/Recap of past 24 hours: Hunter [name] is a 31 y.o. male with history of IV drug use, prior hand infection and group A streptococcal bacteremia, status post surgery in November discharged on Augmentin which he stopped taking now admitted with MSSA bacteremia and sepsis, lumbar disc infection with psoas abscess  Today, pt denies any new complaints, reports resolving back pain, denies any chest pain, SOB, abdominal pain, N/V/D/C, fever/chills.  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 Right greater than left paravertebral soft tissue inflammation with extensive right psoas inflammation/myositis. 20 x 6 mm right psoas abscess. Diffuse posterior paraspinal muscle inflammation in the lumbar spine suggestive of myositis. No evidence of infection in the cervical or thoracic spine. No epidural abscess. 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  No heroin or  opiates in a long time.   Patient [place] planning [place] to [place] with Dad, who he thinks would be willing to take him in (he got clean before while living with his dad).  He was in recovery for almost a year not that long ago here in [place], went to IOP at [facility]. Was on methadone at that time. Never did NA/AA before.  Also did an 18-day inpatient detox in [place], [place] once.  -Appreciate Dr. Jacqualin CombesVincent's assistance with possibly starting suboxone when pain is better controlled in 1-2 weeks  #HCV + AB, Genotype 2b  HCV RNA 6.547, hep c quantitative 3,520, 000 ID on board, app recs  #Normocytic anemia Stable, baseline Hgb 11s  #Hypokalemia Resolved    Code Status: Full  Family Communication: None at bedside   Disposition Plan: Long term AB, SNF Vs most likely inpatient.  IVDU cant go home with PICC line   Consultants:  ID  Cardiology for TEE  Neurosurgery  IMTS for suboxone  Procedures:  TEE   Antimicrobials:  IV Cefazolin  DVT prophylaxis:  Lovenox   Objective: Vitals:   06/02/17 2103 06/03/17 0457 06/03/17 0500 06/03/17 1401  BP: 110/63 117/75  118/68  Pulse: 96 83  88  Resp: 14 14  18   Temp: 98.3 F (36.8 C) 97.7 F (36.5 C)    TempSrc: Oral Oral    SpO2: 100% 99%  99%  Weight:   75.5 kg (166 lb 7.2 oz)   Height:        Intake/Output Summary (Last 24 hours) at 06/03/2017 1855 Last data filed at 06/03/2017 1835 Gross per 24 hour  Intake 1222.51 ml  Output -  Net 1222.51 ml   Filed Weights   06/02/17 0500 06/02/17 0610 06/03/17 0500  Weight: 75.5 kg (166 lb 7.2 oz) 74.8 kg (165 lb) 75.5 kg (166 lb 7.2 oz)    Exam:   General: Alert, awake, oriented x3, multiple tattoos noted  Cardiovascular: S1-S2 present, no added heart sounds  Respiratory: Chest clear bilaterally  Abdomen: Soft, nontender, nondistended, bowel sounds present  Musculoskeletal: No pedal edema bilaterally  Skin: Multiple tattoos noted, no  rash  Psychiatry: Normal mood   Data Reviewed: CBC: Recent Labs  Lab 05/31/17 0328  WBC 12.6*  NEUTROABS 7.9*  HGB 10.9*  HCT 35.4*  MCV 87.6  PLT 453*   Basic Metabolic Panel: Recent Labs  Lab 05/28/17 0550 05/29/17 0511 05/31/17 0328  NA 134*  --  136  K 4.2  --  3.5  CL 100*  --  100*  CO2 25  --  27  GLUCOSE 96  --  93  BUN 11  --  17  CREATININE 0.55* 0.65 0.69  CALCIUM 8.9  --  9.1   GFR: Estimated Creatinine Clearance: 142.9 mL/min (by C-G formula based on SCr of 0.69 mg/dL). Liver Function Tests: No results for input(s): AST, ALT, ALKPHOS, BILITOT, PROT, ALBUMIN in the last 168 hours. No results for input(s): LIPASE, AMYLASE in the last 168 hours. No results for input(s): AMMONIA in the last 168 hours. Coagulation Profile: Recent Labs  Lab 05/28/17 0550  INR 1.10   Cardiac Enzymes: No results for input(s): CKTOTAL, CKMB, CKMBINDEX, TROPONINI in the last 168 hours. BNP (last 3 results) No results for input(s): PROBNP in the last 8760 hours. HbA1C: No results for input(s): HGBA1C in the last 72 hours. CBG: Recent Labs  Lab 05/30/17 1616 05/31/17 0743 06/01/17 0751 06/03/17 0459 06/03/17 0744  GLUCAP 266* 88 101* 98 104*   Lipid Profile: No results for input(s): CHOL, HDL, LDLCALC, TRIG, CHOLHDL, LDLDIRECT in the last 72 hours. Thyroid Function Tests: No results for input(s): TSH, T4TOTAL, FREET4, T3FREE, THYROIDAB in the last 72 hours. Anemia Panel: No results for input(s): VITAMINB12, FOLATE, FERRITIN, TIBC, IRON, RETICCTPCT in the last 72 hours. Urine analysis:    Component Value Date/Time   COLORURINE YELLOW 04/26/2017 1840   APPEARANCEUR CLEAR 04/26/2017 1840   LABSPEC 1.010 04/26/2017 1840   PHURINE 6.0 04/26/2017 1840   GLUCOSEU NEGATIVE 04/26/2017 1840   HGBUR NEGATIVE 04/26/2017 1840   BILIRUBINUR NEGATIVE 04/26/2017 1840   KETONESUR NEGATIVE 04/26/2017 1840   PROTEINUR NEGATIVE 04/26/2017 1840   NITRITE NEGATIVE 04/26/2017  1840   LEUKOCYTESUR NEGATIVE 04/26/2017 1840   Sepsis Labs: @LABRCNTIP (procalcitonin:4,lacticidven:4)  ) No results found for this or any previous visit (from the past 240 hour(s)).    Studies: No results found.  Scheduled Meds: . enoxaparin (LOVENOX) injection  40 mg Subcutaneous Q24H  . sodium chloride flush  3 mL Intravenous Q12H    Continuous Infusions: . sodium chloride 10 mL/hr at 06/03/17 0357  .  ceFAZolin (ANCEF) IV Stopped (06/03/17 1739)     LOS: 12 days     Briant CedarNkeiruka J Dayle Mcnerney, MD [facility]   If 7PM-7AM, please contact night-coverage [email or web address] Password [email or web address] 06/03/2017, 6:55 PM

## 2017-06-03 NOTE — Progress Notes (Signed)
Physical Therapy Treatment Patient Details Name: Hunter [name] MRN: [id number] DOB: [birth year] Today's Date: 06/03/2017    History of Present Illness Hunter [name] is a 31 y.o. male with medical history significant for IVDA, recent hospitalization for right forearm deep abscess and strep pyogenes bacteremia, hepatitis C who presented to ED with reports of shortness of breath for past 3-4 days prior to the admission associated with chest pain especially when taking a deep breath. MRI showed discitis/osteomyelitis of lumbar spine; Sepsis (HCC) / Osteomyelitis (HCC) / Discitis of lumbar region / Leukocytosis    PT Comments    Pt progresses towards PT goals today, ambulating further distances with no UE support and demonstrating stair negotiation with min guard and use of one railing during stair descent. Instructed pt to have significant other bring sneakers for him to use during next PT session. Pt reports that his back pain seems pretty constant during session however pain worsens when weight-bearing on LLE. Current discharge plan remains appropriate. PT will follow acutely in order to maximize mobility and improve strength while in the hospital setting.    Follow Up Recommendations  No PT follow up     Equipment Recommendations  None recommended by PT    Recommendations for Other Services       Precautions / Restrictions Restrictions Weight Bearing Restrictions: No    Mobility  Bed Mobility Overal bed mobility: Modified Independent Bed Mobility: Rolling;Sidelying to Sit Rolling: Modified independent (Device/Increase time) Sidelying to sit: Modified independent (Device/Increase time);HOB elevated       General bed mobility comments: increased time and effort; use of rail; good demonstration of log roll  Transfers Overall transfer level: Needs assistance Equipment used: None Transfers: Sit to/from Stand Sit to Stand: Supervision         General transfer comment:  supervision for safety; pt with guarded movements when standing due to back pain  Ambulation/Gait Ambulation/Gait assistance: Supervision;Min guard Ambulation Distance (Feet): 600 Feet Assistive device: None Gait Pattern/deviations: Step-through pattern;Decreased stride length;Antalgic Gait velocity: decreased   General Gait Details: Pt ambulated down halls without UE support and min guard. Pt ambulated momentarily with just supervision towards end of session. Pt reports that back pain worsens when weight-bearing on LLE.    Stairs Stairs: Yes   Stair Management: One rail Left;Step to pattern Number of Stairs: 6 General stair comments: Pt ascends stairs slowly, without use of railing, and step-to pattern. Pt descends stairs with caution, use of railing with RUE, and step-to pattern.   Wheelchair Mobility    Modified Rankin (Stroke Patients Only)       Balance Overall balance assessment: Modified Independent Sitting-balance support: Feet supported;No upper extremity supported Sitting balance-Leahy Scale: Good Sitting balance - Comments: Pt demonstrates free use of UEs while seated EOB   Standing balance support: No upper extremity supported;During functional activity Standing balance-Leahy Scale: Good Standing balance comment: Pt able to stand statically and perform dynamic standing balance without external support. However, pt is limited in high level balance activities.                             Cognition Arousal/Alertness: Awake/alert Behavior During Therapy: WFL for tasks assessed/performed Overall Cognitive Status: Within Functional Limits for tasks assessed  Exercises      General Comments General comments (skin integrity, edema, etc.): Pt notes that back pain stays the same throughout session relatively, however pain increases with weight bearing on LLE. Instructed pt to ask his girlfriend to bring  him sneakers for next PT session. Pt continues to state that "that chair is the devil" and that is increases his back pain, so he prefers to sit EOB.       Pertinent Vitals/Pain Pain Assessment: 0-10 Pain Score: 7  Pain Location: Back pain Pain Descriptors / Indicators: Aching Pain Intervention(s): Limited activity within patient's tolerance;Monitored during session    Home Living                      Prior Function            PT Goals (current goals can now be found in the care plan section) Progress towards PT goals: Progressing toward goals    Frequency    Min 2X/week      PT Plan Current plan remains appropriate;Frequency needs to be updated    Co-evaluation              AM-PAC PT "6 Clicks" Daily Activity  Outcome Measure  Difficulty turning over in bed (including adjusting bedclothes, sheets and blankets)?: A Little Difficulty moving from lying on back to sitting on the side of the bed? : A Little Difficulty sitting down on and standing up from a chair with arms (e.g., wheelchair, bedside commode, etc,.)?: A Little Help needed moving to and from a bed to chair (including a wheelchair)?: A Little Help needed walking in hospital room?: A Little Help needed climbing 3-5 steps with a railing? : A Little 6 Click Score: 18    End of Session Equipment Utilized During Treatment: Gait belt Activity Tolerance: Patient tolerated treatment well Patient left: with call bell/phone within reach;in bed(sitting at EOB)   PT Visit Diagnosis: Unsteadiness on feet (R26.81);Other abnormalities of gait and mobility (R26.89);Pain     Time: 1439-1456 PT Time Calculation (min) (ACUTE ONLY): 17 min  Charges:  $Gait Training: 8-22 mins                    G Codes:       Reina Fuserin Artha Stavros, SPT   Reina Fuserin Maryon Kemnitz 06/03/2017, 3:48 PM

## 2017-06-04 ENCOUNTER — Inpatient Hospital Stay (HOSPITAL_COMMUNITY): Payer: Self-pay

## 2017-06-04 LAB — CBC WITH DIFFERENTIAL/PLATELET
BASOS PCT: 0 %
Basophils Absolute: 0 10*3/uL (ref 0.0–0.1)
Eosinophils Absolute: 0.3 10*3/uL (ref 0.0–0.7)
Eosinophils Relative: 4 %
HEMATOCRIT: 31.4 % — AB (ref 39.0–52.0)
Hemoglobin: 10 g/dL — ABNORMAL LOW (ref 13.0–17.0)
Lymphocytes Relative: 21 %
Lymphs Abs: 1.4 10*3/uL (ref 0.7–4.0)
MCH: 27.7 pg (ref 26.0–34.0)
MCHC: 31.8 g/dL (ref 30.0–36.0)
MCV: 87 fL (ref 78.0–100.0)
MONO ABS: 0.9 10*3/uL (ref 0.1–1.0)
MONOS PCT: 14 %
NEUTROS ABS: 4.1 10*3/uL (ref 1.7–7.7)
Neutrophils Relative %: 61 %
Platelets: 260 10*3/uL (ref 150–400)
RBC: 3.61 MIL/uL — ABNORMAL LOW (ref 4.22–5.81)
RDW: 14.8 % (ref 11.5–15.5)
WBC: 6.7 10*3/uL (ref 4.0–10.5)

## 2017-06-04 LAB — GLUCOSE, CAPILLARY: Glucose-Capillary: 106 mg/dL — ABNORMAL HIGH (ref 65–99)

## 2017-06-04 LAB — BASIC METABOLIC PANEL
ANION GAP: 13 (ref 5–15)
BUN: 14 mg/dL (ref 6–20)
CALCIUM: 9.1 mg/dL (ref 8.9–10.3)
CO2: 24 mmol/L (ref 22–32)
Chloride: 99 mmol/L — ABNORMAL LOW (ref 101–111)
Creatinine, Ser: 0.61 mg/dL (ref 0.61–1.24)
GFR calc Af Amer: >60 mL/min (ref >60)
GFR calc non Af Amer: >60 mL/min (ref >60)
GLUCOSE: 80 mg/dL (ref 65–99)
Potassium: 4.8 mmol/L (ref 3.5–5.1)
Sodium: 136 mmol/L (ref 135–145)

## 2017-06-04 NOTE — Progress Notes (Signed)
PROGRESS NOTE  Hunter [name] ZOX:[id number] DOB: 1986-01-16 DOA: 05/22/2017 PCP: Patient, No Pcp Per  HPI/Recap of past 24 hours: Hunter [name] is a 31 y.o. 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 Right greater than left paravertebral soft tissue inflammation with extensive right psoas inflammation/myositis. 20 x 6 mm right psoas abscess. Diffuse posterior paraspinal muscle inflammation in the lumbar spine suggestive of myositis. No evidence of infection in the cervical or thoracic spine. No epidural abscess. 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  No heroin or opiates in a long time.   -Patient planning to eventually move back to [place] to live with his dad -Appreciate Dr. Jacqualin CombesVincent's assistance with possibly starting suboxone when pain is better controlled in 1-2 weeks  #HCV + AB, Genotype 2b  HCV RNA 6.547, hep c  quantitative 3,520, 000 ID on board, app recs  #Normocytic anemia Stable, baseline Hgb 11s  #Hypokalemia Resolved  DVT prophylaxis with Lovenox  Code Status: Full Family Communication: None at bedside  Disposition Plan: Long term AB, SNF Vs most likely inpatient.  IVDU cant go home with PICC line   Consultants:  ID  Cardiology for TEE  Neurosurgery  IMTS for suboxone  Procedures:  TEE   Antimicrobials:  IV Cefazolin    Objective: Vitals:   06/03/17 2114 06/04/17 0500 06/04/17 0605 06/04/17 1322  BP: 107/65  115/70 107/60  Pulse: 86  83 93  Resp: 18  18 18   Temp: 98.6 F (37 C)  98.8 F (37.1 C) 98.6 F (37 C)  TempSrc:    Oral  SpO2: 99%  98% 98%  Weight:  74.8 kg (164 lb 14.5 oz)    Height:        Intake/Output Summary (Last 24 hours) at 06/04/2017 1436 Last data filed at 06/04/2017 [id number] Gross per 24 hour  Intake 780.34 ml  Output -  Net 780.34 ml   Filed Weights   06/02/17 0610 06/03/17 0500 06/04/17 0500  Weight: 74.8 kg (165 lb) 75.5 kg (166 lb 7.2 oz) 74.8 kg (164 lb 14.5 oz)    Exam:  Gen: Awake, Alert, Oriented X 3,  HEENT: PERRLA, Neck supple, no JVD Lungs: Good air movement bilaterally, CTAB CVS: RRR,No Gallops,Rubs or new Murmurs Abd: soft, Non tender, non distended, BS present Extremities: No Cyanosis, Clubbing or edema Skin: Numerous tattoos noted  Data Reviewed: CBC: Recent Labs  Lab 05/31/17 0328 06/04/17 0628  WBC 12.6* 6.7  NEUTROABS 7.9* 4.1  HGB 10.9* 10.0*  HCT 35.4* 31.4*  MCV 87.6 87.0  PLT 453* 260   Basic Metabolic  Panel: Recent Labs  Lab 05/29/17 0511 05/31/17 0328 06/04/17 0628  NA  --  136 136  K  --  3.5 4.8  CL  --  100* 99*  CO2  --  27 24  GLUCOSE  --  93 80  BUN  --  17 14  CREATININE 0.65 0.69 0.61  CALCIUM  --  9.1 9.1   GFR: Estimated Creatinine Clearance: 141.5 mL/min (by C-G formula based on SCr of 0.61 mg/dL). Liver Function Tests: No results for input(s): AST, ALT,  ALKPHOS, BILITOT, PROT, ALBUMIN in the last 168 hours. No results for input(s): LIPASE, AMYLASE in the last 168 hours. No results for input(s): AMMONIA in the last 168 hours. Coagulation Profile: No results for input(s): INR, PROTIME in the last 168 hours. Cardiac Enzymes: No results for input(s): CKTOTAL, CKMB, CKMBINDEX, TROPONINI in the last 168 hours. BNP (last 3 results) No results for input(s): PROBNP in the last 8760 hours. HbA1C: No results for input(s): HGBA1C in the last 72 hours. CBG: Recent Labs  Lab 05/31/17 0743 06/01/17 0751 06/03/17 0459 06/03/17 0744 06/04/17 0747  GLUCAP 88 101* 98 104* 106*   Lipid Profile: No results for input(s): CHOL, HDL, LDLCALC, TRIG, CHOLHDL, LDLDIRECT in the last 72 hours. Thyroid Function Tests: No results for input(s): TSH, T4TOTAL, FREET4, T3FREE, THYROIDAB in the last 72 hours. Anemia Panel: No results for input(s): VITAMINB12, FOLATE, FERRITIN, TIBC, IRON, RETICCTPCT in the last 72 hours. Urine analysis:    Component Value Date/Time   COLORURINE YELLOW 04/26/2017 1840   APPEARANCEUR CLEAR 04/26/2017 1840   LABSPEC 1.010 04/26/2017 1840   PHURINE 6.0 04/26/2017 1840   GLUCOSEU NEGATIVE 04/26/2017 1840   HGBUR NEGATIVE 04/26/2017 1840   BILIRUBINUR NEGATIVE 04/26/2017 1840   KETONESUR NEGATIVE 04/26/2017 1840   PROTEINUR NEGATIVE 04/26/2017 1840   NITRITE NEGATIVE 04/26/2017 1840   LEUKOCYTESUR NEGATIVE 04/26/2017 1840   Sepsis Labs: @LABRCNTIP (procalcitonin:4,lacticidven:4)  ) No results found for this or any previous visit (from the past 240 hour(s)).    Studies: No results found.  Scheduled Meds: . enoxaparin (LOVENOX) injection  40 mg Subcutaneous Q24H  . sodium chloride flush  3 mL Intravenous Q12H    Continuous Infusions: . sodium chloride 10 mL/hr at 06/03/17 0357  .  ceFAZolin (ANCEF) IV 2 g (06/04/17 1310)     LOS: 13 days     Zannie CovePreetha Jermaine Neuharth, MD [facility]   If 7PM-7AM, please contact  night-coverage [email or web address] Password [email or web address] 06/04/2017, 2:36 PM

## 2017-06-05 LAB — GLUCOSE, CAPILLARY: GLUCOSE-CAPILLARY: 129 mg/dL — AB (ref 65–99)

## 2017-06-05 MED ORDER — CYCLOBENZAPRINE HCL 5 MG PO TABS
5.0000 mg | ORAL_TABLET | Freq: Three times a day (TID) | ORAL | Status: DC | PRN
  Administered 2017-06-05 – 2017-07-03 (×71): 5 mg via ORAL
  Filled 2017-06-05 (×77): qty 1

## 2017-06-05 NOTE — Progress Notes (Signed)
Pt seen, no changes, continues to complain of low back pain which is improving, increased mobility and spasms now, will add flexeril -see 12/28 note for details  Zannie CovePreetha Jermanie Minshall, MD

## 2017-06-06 LAB — GLUCOSE, CAPILLARY: GLUCOSE-CAPILLARY: 109 mg/dL — AB (ref 65–99)

## 2017-06-06 NOTE — Progress Notes (Signed)
No changes, continue current care

## 2017-06-07 LAB — GLUCOSE, CAPILLARY: Glucose-Capillary: 95 mg/dL (ref 65–99)

## 2017-06-07 NOTE — Progress Notes (Signed)
Pt has fever of 101.6 and HR 106. Pt states he just does not feel good.  Medication given and MD notified. MD stated to continue to monitor pt fever and if fever persists to page back. Will continue to monitor.

## 2017-06-07 NOTE — Progress Notes (Signed)
PROGRESS NOTE  Hunter [name] ION:[id number] DOB: [birth year] DOA: 05/22/2017 PCP: Patient, No Pcp Per  HPI/Recap of past 24 hours: Hunter [name] is a 31 y.o. 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 No epidural abscess. -Blood cultures grew MSSA, repeat blood cultures with no growth to date -Continue IV Ancef, followed by infectious disease -Recommended to complete 6 week course of IV Ancef on January 26, reconsult infectious disease near antibiotic completion date -TEE did not show any evidence of definitive vegetation -CSW following, not appropriate for discharge home with PICC line given IV drug abuse, will either need SNF stay inpatient to complete antibiotic course -Oral oxycodone PRN, added flexeril PRN too -febrile overnight, need to be sure he is not tampering with his IV line, monitor  #Chest wall abscess -Resolved with antibiotics  #IVDU (intravenous drug user) Drug use 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  No heroin or opiates in a long time.   -Patient planning to eventually move back to [place] to live with his dad -Appreciate Dr. Jacqualin CombesVincent's assistance with possibly starting suboxone when pain is better controlled in 1-2 weeks  #HCV + AB, Genotype 2b  HCV RNA 6.547, hep c quantitative 3,520, 000 ID on board, app recs  #Normocytic anemia Stable, baseline Hgb  11s  #Hypokalemia Resolved  DVT prophylaxis with Lovenox  Code Status: Full Family Communication: None at bedside  Disposition Plan: Long term AB, SNF Vs most likely inpatient.  IVDU cant go home with PICC line   Consultants:  ID  Cardiology for TEE  Neurosurgery  IMTS for suboxone  Procedures:  TEE   Antimicrobials:  IV Cefazolin    Objective: Vitals:   06/06/17 1416 06/06/17 2104 06/06/17 2209 06/07/17 0453  BP: 124/69 110/64  112/73  Pulse: (!) 103 (!) 106  92  Resp: 18 18  18   Temp: 99.2 F (37.3 C) (!) 101.6 F (38.7 C) 99.8 F (37.7 C) 98.8 F (37.1 C)  TempSrc:  Oral Oral Oral  SpO2: 98% 100%  99%  Weight:    74.8 kg (164 lb 12.8 oz)  Height:        Intake/Output Summary (Last 24 hours) at 06/07/2017 1250 Last data filed at 06/07/2017 0439 Gross per 24 hour  Intake 674.5 ml  Output -  Net 674.5 ml   Filed Weights   06/05/17 0500 06/06/17 0519 06/07/17 0453  Weight: 76.5 kg (168 lb 10.4 oz) 75.2 kg (165 lb 12.8 oz) 74.8 kg (164 lb 12.8 oz)    Exam:  Gen: Awake, Alert, Oriented X 3,  HEENT: PERRLA, Neck supple, no JVD Lungs: Good air movement bilaterally, CTAB CVS: RRR,No Gallops,Rubs or new Murmurs Abd: soft, Non tender, non distended, BS present Extremities: No Cyanosis, Clubbing or edema Skin: Numerous tattoos noted  Data Reviewed: CBC: Recent Labs  Lab 06/04/17 0628  WBC 6.7  NEUTROABS 4.1  HGB 10.0*  HCT 31.4*  MCV 87.0  PLT 260   Basic Metabolic Panel: Recent Labs  Lab 06/04/17 0628  NA 136  K 4.8  CL 99*  CO2 24  GLUCOSE 80  BUN 14  CREATININE 0.61  CALCIUM 9.1   GFR: Estimated Creatinine Clearance: 141.5 mL/min (by C-G formula based on SCr of 0.61 mg/dL). Liver Function Tests: No results for input(s): AST, ALT, ALKPHOS, BILITOT, PROT, ALBUMIN in the last 168 hours. No results for input(s): LIPASE, AMYLASE in the last 168 hours. No results for input(s): AMMONIA in the last 168 hours. Coagulation  Profile: No results for input(s): INR, PROTIME in the last 168 hours. Cardiac Enzymes: No results for input(s): CKTOTAL, CKMB, CKMBINDEX, TROPONINI in the last 168 hours. BNP (last 3 results) No results for input(s): PROBNP in the last 8760 hours. HbA1C: No results for input(s): HGBA1C in the last 72 hours. CBG: Recent Labs  Lab 06/03/17 0744 06/04/17 0747 06/05/17 0456 06/06/17 0812 06/07/17 0733  GLUCAP 104* 106* 129* 109* 95   Lipid Profile: No results for input(s): CHOL, HDL, LDLCALC, TRIG, CHOLHDL, LDLDIRECT in the last 72 hours. Thyroid Function Tests: No results for input(s): TSH, T4TOTAL, FREET4, T3FREE, THYROIDAB in the last 72 hours. Anemia Panel: No results for input(s): VITAMINB12, FOLATE, FERRITIN, TIBC, IRON, RETICCTPCT in the last 72 hours. Urine analysis:    Component Value Date/Time   COLORURINE YELLOW 04/26/2017 1840   APPEARANCEUR CLEAR 04/26/2017 1840   LABSPEC 1.010 04/26/2017 1840   PHURINE 6.0 04/26/2017 1840   GLUCOSEU NEGATIVE 04/26/2017 1840   HGBUR NEGATIVE 04/26/2017 1840   BILIRUBINUR NEGATIVE 04/26/2017 1840   KETONESUR NEGATIVE 04/26/2017 1840   PROTEINUR NEGATIVE 04/26/2017 1840   NITRITE NEGATIVE 04/26/2017 1840   LEUKOCYTESUR NEGATIVE 04/26/2017 1840   Sepsis Labs: @LABRCNTIP (procalcitonin:4,lacticidven:4)  ) No results found for this or any previous visit (from the past 240 hour(s)).    Studies: No results found.  Scheduled Meds: . enoxaparin (LOVENOX) injection  40 mg Subcutaneous Q24H  . sodium chloride flush  3 mL Intravenous Q12H    Continuous Infusions: . sodium chloride 10 mL/hr at 06/03/17 0357  .  ceFAZolin (ANCEF) IV 2 g (06/07/17 0534)     LOS: 16 days     Zannie CovePreetha Nataki Mccrumb, MD [facility]   If 7PM-7AM, please contact night-coverage [email or web address] Password [email or web address] 06/07/2017, 12:50 PM

## 2017-06-08 LAB — GLUCOSE, CAPILLARY: Glucose-Capillary: 96 mg/dL (ref 65–99)

## 2017-06-08 NOTE — Progress Notes (Signed)
NT reported to RN that patient was observed putting an empty flush into his pocket. Pt's girlfriend was present in room at the time. RN entered room and questioned pt regarding the incident and pt denied having a flush, turning out his pockets. No flushes seen in room. Pt's girlfriend no longer in the room. Nurse instructed pt that he must not take any flushes off the floor. Pt stated he understood.

## 2017-06-08 NOTE — Progress Notes (Signed)
Pt seen, no changes, remained stable, continue current course of IV Ancef  Zannie CovePreetha Aria Pickrell, MD

## 2017-06-09 ENCOUNTER — Encounter (HOSPITAL_COMMUNITY): Payer: Self-pay

## 2017-06-09 LAB — GLUCOSE, CAPILLARY: GLUCOSE-CAPILLARY: 96 mg/dL (ref 65–99)

## 2017-06-09 NOTE — Progress Notes (Addendum)
PROGRESS NOTE  Hunter [name] DOB: May 06, 1986 DOA: 05/22/2017 PCP: Patient, No Pcp Per  HPI/Recap of past 24 hours: Hunter [name] is a 32 y.o. 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 No epidural abscess. -Blood cultures grew MSSA, repeat blood cultures with no growth to date -Continue IV Ancef, followed by infectious disease -Recommended to complete 6 week course of IV Ancef on January 26, reconsult infectious disease near antibiotic completion date -TEE did not show any evidence of definitive vegetation -CSW following, not appropriate for discharge home with PICC line given IV drug abuse, will either need SNF or stay inpatient to complete antibiotic course -Oral oxycodone PRN, added flexeril PRN too -febrile in past 1-2days, and sudden intermittent tachycardia, need to be sure he is not tampering with his IV line, monitor, some suspicious areas noted in arms in last 48hours, asked RN and staff to be vigilant  #Chest wall abscess -Resolved with antibiotics  #IVDU (intravenous drug user) Drug use 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  No heroin or opiates in a long time.   -Patient planning to eventually move back to [place] to live with his dad -Appreciate Dr. Jacqualin CombesVincent's assistance with possibly starting suboxone when pain is better controlled, possibly next week  #HCV + AB, Genotype 2b  HCV RNA 6.547, hep c  quantitative 3,520, 000 ID on board, app recs  #Normocytic anemia Stable, baseline Hgb 11s  #Hypokalemia Resolved  DVT prophylaxis with Lovenox  Code Status: Full Family Communication: None at bedside  Disposition Plan: Long term AB, SNF Vs most likely inpatient.  IVDU cant go home with PICC line   Consultants:  ID  Cardiology for TEE  Neurosurgery  IMTS for suboxone  Procedures:  TEE   Antimicrobials:  IV Cefazolin    Objective: Vitals:   06/08/17 2105 06/09/17 0500 06/09/17 0610 06/09/17 1408  BP: 102/60  131/86 118/74  Pulse: 75  (!) 106 (!) 102  Resp: 17  18 17   Temp: 98 F (36.7 C)  98.7 F (37.1 C) 98.4 F (36.9 C)  TempSrc: Oral  Oral Oral  SpO2: 100%  100% 100%  Weight:  74.5 kg (164 lb 3.9 oz)    Height:        Intake/Output Summary (Last 24 hours) at 06/09/2017 1419 Last data filed at 06/09/2017 1408 Gross per 24 hour  Intake 891 ml  Output -  Net 891 ml   Filed Weights   06/07/17 0453 06/08/17 0633 06/09/17 0500  Weight: 74.8 kg (164 lb 12.8 oz) 75.2 kg (165 lb 12.8 oz) 74.5 kg (164 lb 3.9 oz)    Exam:  Gen: Awake, Alert, Oriented X 3,  HEENT: PERRLA, Neck supple, no JVD Lungs: Good air movement bilaterally, CTAB CVS: RRR,No Gallops,Rubs or new Murmurs Abd: soft, Non tender, non distended, BS present Extremities: No Cyanosis, Clubbing or edema Skin: Numerous tattoos noted  Data Reviewed: CBC: Recent Labs  Lab 06/04/17 0628  WBC 6.7  NEUTROABS 4.1  HGB 10.0*  HCT 31.4*  MCV 87.0  PLT 260   Basic Metabolic Panel: Recent Labs  Lab 06/04/17 0628  NA 136  K 4.8  CL 99*  CO2 24  GLUCOSE 80  BUN 14  CREATININE 0.61  CALCIUM 9.1   GFR: Estimated Creatinine Clearance: 141 mL/min (by C-G formula based on SCr of 0.61 mg/dL). Liver Function Tests: No results for input(s): AST, ALT, ALKPHOS, BILITOT, PROT, ALBUMIN in the last 168 hours. No results for input(s): LIPASE, AMYLASE in the last 168 hours. No results for  input(s): AMMONIA in the last 168 hours. Coagulation Profile: No results for input(s): INR, PROTIME in the last 168 hours. Cardiac Enzymes: No results for input(s): CKTOTAL, CKMB, CKMBINDEX, TROPONINI in the last 168 hours. BNP (last 3 results) No results for input(s): PROBNP in the last 8760 hours. HbA1C: No results for input(s): HGBA1C in the last 72 hours. CBG: Recent Labs  Lab 06/05/17 0456 06/06/17 0812 06/07/17 0733 06/08/17 0746 06/09/17 0800  GLUCAP 129* 109* 95 96 96   Lipid Profile: No results for input(s): CHOL, HDL, LDLCALC, TRIG, CHOLHDL, LDLDIRECT in the last 72 hours. Thyroid Function Tests: No results for input(s): TSH, T4TOTAL, FREET4, T3FREE, THYROIDAB in the last 72 hours. Anemia Panel: No results for input(s): VITAMINB12, FOLATE, FERRITIN, TIBC, IRON, RETICCTPCT in the last 72 hours. Urine analysis:    Component Value Date/Time   COLORURINE YELLOW 04/26/2017 1840   APPEARANCEUR CLEAR 04/26/2017 1840   LABSPEC 1.010 04/26/2017 1840   PHURINE 6.0 04/26/2017 1840   GLUCOSEU NEGATIVE 04/26/2017 1840   HGBUR NEGATIVE 04/26/2017 1840   BILIRUBINUR NEGATIVE 04/26/2017 1840   KETONESUR NEGATIVE 04/26/2017 1840   PROTEINUR NEGATIVE 04/26/2017 1840   NITRITE NEGATIVE 04/26/2017 1840   LEUKOCYTESUR NEGATIVE 04/26/2017 1840   Sepsis Labs: @LABRCNTIP (procalcitonin:4,lacticidven:4)  ) No results found for this or any previous visit (from the past 240 hour(s)).    Studies: No results found.  Scheduled Meds: . enoxaparin (LOVENOX) injection  40 mg Subcutaneous Q24H  . sodium chloride flush  3 mL Intravenous Q12H    Continuous Infusions: . sodium chloride 10 mL/hr at 06/03/17 0357  .  ceFAZolin (ANCEF) IV Stopped (06/09/17 1351)     LOS: 18 days     [name], MD [facility] Page via [email or web address] password [email or web address]  If 7PM-7AM, please contact night-coverage  06/09/2017, 2:19 PM

## 2017-06-09 NOTE — Progress Notes (Addendum)
Physical Therapy Treatment Patient Details Name: Hunter [name] MRN: [id number] DOB: [birth year] Today's Date: 06/09/2017    History of Present Illness Hunter [name] is a 32 y.o. male with medical history significant for IVDA, recent hospitalization for right forearm deep abscess and strep pyogenes bacteremia, hepatitis C who presented to ED with reports of shortness of breath for past 3-4 days prior to the admission associated with chest pain especially when taking a deep breath. MRI showed discitis/osteomyelitis of lumbar spine; Sepsis ([place]) / Osteomyelitis ([place]) / Discitis of lumbar region / Leukocytosis    PT Comments    Pt is showing progress towards goals and has achieved 3/6 goals. Goals updated and discussed with evaluating PT. Pt currently ambulates with supervision for safety and slightly antalgic gait secondary to pain. He would benefit from continued skilled PT to achieve unmet goals and increase safety with mobility.   Follow Up Recommendations  No PT follow up     Equipment Recommendations  None recommended by PT    Recommendations for Other Services OT consult(ordered per protocol)     Precautions / Restrictions Precautions Precautions: None Precaution Comments: Back precautions for comfort Restrictions Weight Bearing Restrictions: No    Mobility  Bed Mobility Overal bed mobility: Modified Independent             General bed mobility comments: Up in room on arrival  Transfers Overall transfer level: Needs assistance Equipment used: None Transfers: Sit to/from Stand Sit to Stand: Supervision         General transfer comment: supervision for safety; pt with guarded movements when standing due to back pain  Ambulation/Gait Ambulation/Gait assistance: Supervision Ambulation Distance (Feet): 250 Feet Assistive device: None Gait Pattern/deviations: Step-through pattern;Decreased stride length;Antalgic Gait velocity: decreased Gait velocity  interpretation: Below normal speed for age/gender General Gait Details: Pt able to ambulate in hall w/o AD and supervision for safety. No instability noted, though pt with slow guarded gait.    Stairs     Stair Management: One rail Left;Step to pattern;Alternating pattern;Forwards Number of Stairs: 7 General stair comments: Pt slow and guarded with stair negotiation. He initially attempted to negotiate steps with alternating pattern. Advised pt to attempt step to pattern secondary to increased pain.   Wheelchair Mobility    Modified Rankin (Stroke Patients Only)       Balance Overall balance assessment: Modified Independent Sitting-balance support: Feet supported;No upper extremity supported Sitting balance-Leahy Scale: Good Sitting balance - Comments: Pt able to don socks while seated in chair. Reports increased pain.   Standing balance support: No upper extremity supported;During functional activity Standing balance-Leahy Scale: Good Standing balance comment: pt able to ambulate w/o AD.                             Cognition Arousal/Alertness: Awake/alert Behavior During Therapy: WFL for tasks assessed/performed Overall Cognitive Status: Within Functional Limits for tasks assessed                                        Exercises      General Comments General comments (skin integrity, edema, etc.): Pt in room on arrival and attempting to don clothing. Pt able to sit in chair to don shorts, assist given to don gown secondary to IV in the way.      Pertinent Vitals/Pain Pain Assessment: 0-10 Faces  Pain Scale: Hurts little more Pain Location: Back pain with flexion when donning socks Pain Descriptors / Indicators: Aching Pain Intervention(s): Monitored during session    Home Living                      Prior Function            PT Goals (current goals can now be found in the care plan section) Acute Rehab PT Goals Patient  Stated Goal: to stay clean PT Goal Formulation: With patient Time For Goal Achievement: 06/06/17 Potential to Achieve Goals: Good Progress towards PT goals: Goals met and updated - see care plan    Frequency    Min 2X/week      PT Plan Current plan remains appropriate    Co-evaluation              AM-PAC PT "6 Clicks" Daily Activity  Outcome Measure  Difficulty turning over in bed (including adjusting bedclothes, sheets and blankets)?: A Little Difficulty moving from lying on back to sitting on the side of the bed? : A Little Difficulty sitting down on and standing up from a chair with arms (e.g., wheelchair, bedside commode, etc,.)?: A Little Help needed moving to and from a bed to chair (including a wheelchair)?: A Little Help needed walking in hospital room?: A Little Help needed climbing 3-5 steps with a railing? : A Little 6 Click Score: 18    End of Session Equipment Utilized During Treatment: Gait belt Activity Tolerance: Patient tolerated treatment well Patient left: with call bell/phone within reach;in bed(sitting at EOB) Nurse Communication: Mobility status PT Visit Diagnosis: Unsteadiness on feet (R26.81);Other abnormalities of gait and mobility (R26.89);Pain Pain - part of body: (back)     Time: 1537-9432 PT Time Calculation (min) (ACUTE ONLY): 19 min  Charges:  $Gait Training: 8-22 mins                    G Codes:       Benjiman Core, [place] Pager [phone] Acute Rehab   Allena Katz 06/09/2017, 12:18 PM

## 2017-06-10 LAB — GLUCOSE, CAPILLARY: GLUCOSE-CAPILLARY: 100 mg/dL — AB (ref 65–99)

## 2017-06-10 NOTE — Progress Notes (Addendum)
PROGRESS NOTE    Hunter [name] [facility]  DTO:[id number] DOB: [birth year] DOA: 05/22/2017 PCP: Patient, No Pcp Per     Brief Narrative:  Mylo  is a 32 yo male with history of IV drug use, prior hand infection and group A streptococcal bacteremia, status post surgery in November discharged on Augmentin which he stopped taking now admitted with MSSA bacteremia and sepsis, lumbar disc infection with psoas abscess.   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 No epidural abscess. -Blood cultures grew MSSA, repeat blood cultures with no growth to date -TEE did not show any evidence of definitive vegetation -CSW following, not appropriate for discharge home with PICC line given IV drug abuse, will either need SNF or stay inpatient to complete antibiotic course -ID consulted and now signed off. See last progress note 12/21 for recommendations. Continue IV ancef for 6 weeks, last day 1/26. Weekly CBC, BMP, CRP, ESR. Call ID back near end of antibiotics.   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 No heroin or opiates in a long time.   -Patient planning to eventually move back to [place] to live with his dad -Appreciate Dr. Autumn Patty assistance with possibly starting suboxone when pain is better controlled, possibly next week  HCV -+AB, Genotype 2b, HCV RNA 6.547, hep c quantitative 3,520, 000 -Follow  up with ID   Normocytic anemia -Stable, baseline Hgb ~11   DVT prophylaxis: lovenox Code Status: full Family Communication: at bedside Disposition Plan: inpatient until completion of IV antibiotics    Consultants:   ID  Cardiology  IMTS for suboxone   Procedures:   TEE 12/21, negative for vegetation   Antimicrobials:  Anti-infectives (From admission, onward)   Start     Dose/Rate Route Frequency Ordered Stop   05/23/17 1700  ceFAZolin (ANCEF) IVPB 2g/100 mL premix     2 g 200 mL/hr over 30 Minutes Intravenous Every 8 hours 05/23/17 1628 07/03/17 2359   05/23/17 0400  vancomycin (VANCOCIN) IVPB 1000 mg/200 mL premix  Status:  Discontinued     1,000 mg 200 mL/hr over 60 Minutes Intravenous Every 8 hours 05/22/17 1733 05/23/17 1455   05/22/17 2000  piperacillin-tazobactam (ZOSYN) IVPB 3.375 g  Status:  Discontinued     3.375 g 12.5 mL/hr over 240 Minutes Intravenous Every 8 hours 05/22/17 1733 05/23/17 1628   05/22/17 1745  vancomycin (VANCOCIN) 500 mg in sodium chloride 0.9 % 100 mL IVPB     500 mg 100 mL/hr over 60 Minutes Intravenous  Once 05/22/17 1733 05/22/17 2042   05/22/17 1115  vancomycin (VANCOCIN) IVPB 1000 mg/200 mL premix     1,000 mg 200 mL/hr over 60 Minutes Intravenous  Once 05/22/17 1100 05/22/17 1220   05/22/17 1115  piperacillin-tazobactam (ZOSYN) IVPB 3.375 g     3.375 g 100 mL/hr over 30 Minutes Intravenous  Once 05/22/17 1100 05/22/17 1744   05/22/17 0000  azithromycin (ZITHROMAX) 250  MG tablet     250 mg Oral Daily 05/22/17 1051          Subjective: No new events. No fevers.   Objective: Vitals:   06/09/17 1408 06/09/17 2114 06/09/17 2118 06/10/17 0544  BP: 118/74 121/81  120/67  Pulse: (!) 102 (!) 114  (!) 101  Resp: 17 18  18   Temp: 98.4 F (36.9 C) (!) 97.3 F (36.3 C) 98.5 F (36.9 C) 99.1 F (37.3 C)  TempSrc: Oral Oral Oral Oral  SpO2: 100% 99%  96%  Weight:    73.4 kg (161 lb 14.4 oz)  Height:        Intake/Output  Summary (Last 24 hours) at 06/10/2017 1340 Last data filed at 06/10/2017 1220 Gross per 24 hour  Intake 2399.33 ml  Output -  Net 2399.33 ml   Filed Weights   06/08/17 0633 06/09/17 0500 06/10/17 0544  Weight: 75.2 kg (165 lb 12.8 oz) 74.5 kg (164 lb 3.9 oz) 73.4 kg (161 lb 14.4 oz)    Examination:  General exam: Appears calm  Respiratory system: Clear to auscultation. Respiratory effort normal. Cardiovascular system: S1 & S2 heard, tachycardic, regular rhythm. No JVD, murmurs, rubs, gallops or clicks. No pedal edema. Gastrointestinal system: Abdomen is nondistended, soft and nontender. No organomegaly or masses felt. Normal bowel sounds heard. Central nervous system: Alert and oriented. No focal neurological deficits. Extremities: Symmetric 5 x 5 power. Skin: No rashes, lesions or ulcers Psychiatry: Judgement and insight appear normal  Data Reviewed: I have personally reviewed following labs and imaging studies  CBC: Recent Labs  Lab 06/04/17 0628  WBC 6.7  NEUTROABS 4.1  HGB 10.0*  HCT 31.4*  MCV 87.0  PLT 599   Basic Metabolic Panel: Recent Labs  Lab 06/04/17 0628  NA 136  K 4.8  CL 99*  CO2 24  GLUCOSE 80  BUN 14  CREATININE 0.61  CALCIUM 9.1   GFR: Estimated Creatinine Clearance: 138.9 mL/min (by C-G formula based on SCr of 0.61 mg/dL). Liver Function Tests: No results for input(s): AST, ALT, ALKPHOS, BILITOT, PROT, ALBUMIN in the last 168 hours. No results for input(s): LIPASE, AMYLASE in the last 168 hours. No results for input(s): AMMONIA in the last 168 hours. Coagulation Profile: No results for input(s): INR, PROTIME in the last 168 hours. Cardiac Enzymes: No results for input(s): CKTOTAL, CKMB, CKMBINDEX, TROPONINI in the last 168 hours. BNP (last 3 results) No results for input(s): PROBNP in the last 8760 hours. HbA1C: No results for input(s): HGBA1C in the last 72 hours. CBG: Recent Labs  Lab 06/06/17 0812 06/07/17 0733 06/08/17 0746  06/09/17 0800 06/10/17 0803  GLUCAP 109* 95 96 96 100*   Lipid Profile: No results for input(s): CHOL, HDL, LDLCALC, TRIG, CHOLHDL, LDLDIRECT in the last 72 hours. Thyroid Function Tests: No results for input(s): TSH, T4TOTAL, FREET4, T3FREE, THYROIDAB in the last 72 hours. Anemia Panel: No results for input(s): VITAMINB12, FOLATE, FERRITIN, TIBC, IRON, RETICCTPCT in the last 72 hours. Sepsis Labs: No results for input(s): PROCALCITON, LATICACIDVEN in the last 168 hours.  No results found for this or any previous visit (from the past 240 hour(s)).     Radiology Studies: No results found.    Scheduled Meds: . enoxaparin (LOVENOX) injection  40 mg Subcutaneous Q24H  . sodium chloride flush  3 mL Intravenous Q12H   Continuous Infusions: . sodium chloride 10 mL/hr at 06/03/17 0357  .  ceFAZolin (ANCEF) IV 2 g (06/10/17 1333)  LOS: 19 days    Time spent: 40 minutes   Dessa Phi, DO [facility] [email or web address] Password [email or web address] 06/10/2017, 1:40 PM

## 2017-06-10 NOTE — Plan of Care (Signed)
Progressing

## 2017-06-11 LAB — BASIC METABOLIC PANEL
ANION GAP: 8 (ref 5–15)
BUN: 14 mg/dL (ref 6–20)
CO2: 25 mmol/L (ref 22–32)
Calcium: 8.6 mg/dL — ABNORMAL LOW (ref 8.9–10.3)
Chloride: 99 mmol/L — ABNORMAL LOW (ref 101–111)
Creatinine, Ser: 0.66 mg/dL (ref 0.61–1.24)
GFR calc Af Amer: >60 mL/min (ref >60)
GFR calc non Af Amer: >60 mL/min (ref >60)
Glucose, Bld: 113 mg/dL — ABNORMAL HIGH (ref 65–99)
POTASSIUM: 4.1 mmol/L (ref 3.5–5.1)
SODIUM: 132 mmol/L — AB (ref 135–145)

## 2017-06-11 LAB — C-REACTIVE PROTEIN: CRP: 14.2 mg/dL — ABNORMAL HIGH (ref <1.0)

## 2017-06-11 LAB — CBC
HCT: 28.6 % — ABNORMAL LOW (ref 39.0–52.0)
Hemoglobin: 8.8 g/dL — ABNORMAL LOW (ref 13.0–17.0)
MCH: 26.1 pg (ref 26.0–34.0)
MCHC: 30.8 g/dL (ref 30.0–36.0)
MCV: 84.9 fL (ref 78.0–100.0)
PLATELETS: 225 10*3/uL (ref 150–400)
RBC: 3.37 MIL/uL — AB (ref 4.22–5.81)
RDW: 14.5 % (ref 11.5–15.5)
WBC: 8.1 10*3/uL (ref 4.0–10.5)

## 2017-06-11 LAB — SEDIMENTATION RATE: Sed Rate: 72 mm/hr — ABNORMAL HIGH (ref 0–16)

## 2017-06-11 LAB — GLUCOSE, CAPILLARY: Glucose-Capillary: 106 mg/dL — ABNORMAL HIGH (ref 65–99)

## 2017-06-11 NOTE — Progress Notes (Addendum)
PROGRESS NOTE    Hunter [name] [facility]  HYI:[id number] DOB: May 17, 1986 DOA: 05/22/2017 PCP: Patient, No Pcp Per     Brief Narrative:  Hunter [name] is a 32 yo male with history of IV drug use, prior hand infection and group A streptococcal bacteremia, status post surgery in November discharged on Augmentin which he stopped taking now admitted with MSSA bacteremia and sepsis, lumbar disc infection with psoas abscess.   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 No epidural abscess. -Blood cultures grew MSSA, repeat blood cultures with no growth to date -TEE did not show any evidence of definitive vegetation -CSW following, not appropriate for discharge home with PICC line given IV drug abuse, will either need SNF or stay inpatient to complete antibiotic course -ID consulted and now signed off. See last progress note 12/21 for recommendations. Continue IV ancef for 6 weeks, last day 1/26. Weekly CBC, BMP, CRP, ESR. Call ID back near end of antibiotics.   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 No heroin or opiates in a long time.   -Patient planning to eventually move back to [place] to live with his dad -Appreciate Dr. Autumn Patty assistance with possibly starting suboxone when pain is better controlled, possibly next week  HCV -+AB, Genotype 2b, HCV RNA 6.547, hep c quantitative 3,520, 000 -Follow  up with ID   Normocytic anemia -Stable, baseline Hgb ~11   DVT prophylaxis: lovenox Code Status: full Family Communication: at bedside sleeping  Disposition Plan: inpatient until completion of IV antibiotics    Consultants:   ID  Cardiology  IMTS for suboxone   Procedures:   TEE 12/21, negative for vegetation   Antimicrobials:  Anti-infectives (From admission, onward)   Start     Dose/Rate Route Frequency Ordered Stop   05/23/17 1700  ceFAZolin (ANCEF) IVPB 2g/100 mL premix     2 g 200 mL/hr over 30 Minutes Intravenous Every 8 hours 05/23/17 1628 07/03/17 2359   05/23/17 0400  vancomycin (VANCOCIN) IVPB 1000 mg/200 mL premix  Status:  Discontinued     1,000 mg 200 mL/hr over 60 Minutes Intravenous Every 8 hours 05/22/17 1733 05/23/17 1455   05/22/17 2000  piperacillin-tazobactam (ZOSYN) IVPB 3.375 g  Status:  Discontinued     3.375 g 12.5 mL/hr over 240 Minutes Intravenous Every 8 hours 05/22/17 1733 05/23/17 1628   05/22/17 1745  vancomycin (VANCOCIN) 500 mg in sodium chloride 0.9 % 100 mL IVPB     500 mg 100 mL/hr over 60 Minutes Intravenous  Once 05/22/17 1733 05/22/17 2042   05/22/17 1115  vancomycin (VANCOCIN) IVPB 1000 mg/200 mL premix     1,000 mg 200 mL/hr over 60 Minutes Intravenous  Once 05/22/17 1100 05/22/17 1220   05/22/17 1115  piperacillin-tazobactam (ZOSYN) IVPB 3.375 g     3.375 g 100 mL/hr over 30 Minutes Intravenous  Once 05/22/17 1100 05/22/17 1744   05/22/17 0000  azithromycin (  ZITHROMAX) 250 MG tablet     250 mg Oral Daily 05/22/17 1051         Subjective: No new events. No fevers.   Objective: Vitals:   06/10/17 0544 06/10/17 1352 06/10/17 2115 06/11/17 0429  BP: 120/67 (!) 108/58 103/62 109/63  Pulse: (!) 101 (!) 122 97 100  Resp: 18 17 18 18   Temp: 99.1 F (37.3 C) 99.1 F (37.3 C) 98.5 F (36.9 C) 98.5 F (36.9 C)  TempSrc: Oral Oral    SpO2: 96% 97% 97% 97%  Weight: 73.4 kg (161 lb 14.4 oz)   75 kg (165 lb 6.4 oz)   Height:        Intake/Output Summary (Last 24 hours) at 06/11/2017 1336 Last data filed at 06/11/2017 0602 Gross per 24 hour  Intake 781.33 ml  Output -  Net 781.33 ml   Filed Weights   06/09/17 0500 06/10/17 0544 06/11/17 0429  Weight: 74.5 kg (164 lb 3.9 oz) 73.4 kg (161 lb 14.4 oz) 75 kg (165 lb 6.4 oz)    Examination:  General exam: Appears calm  Respiratory system: Clear to auscultation. Respiratory effort normal. Cardiovascular system: S1 & S2 heard, tachycardic, regular rhythm. No JVD, murmurs, rubs, gallops or clicks. No pedal edema. Gastrointestinal system: Abdomen is nondistended, soft and nontender. No organomegaly or masses felt. Normal bowel sounds heard. Central nervous system: Alert and oriented. No focal neurological deficits. Extremities: Symmetric 5 x 5 power. Skin: No rashes, lesions or ulcers Psychiatry: Judgement and insight appear normal  Data Reviewed: I have personally reviewed following labs and imaging studies  CBC: Recent Labs  Lab 06/11/17 0434  WBC 8.1  HGB 8.8*  HCT 28.6*  MCV 84.9  PLT 569   Basic Metabolic Panel: Recent Labs  Lab 06/11/17 0434  NA 132*  K 4.1  CL 99*  CO2 25  GLUCOSE 113*  BUN 14  CREATININE 0.66  CALCIUM 8.6*   GFR: Estimated Creatinine Clearance: 141.9 mL/min (by C-G formula based on SCr of 0.66 mg/dL). Liver Function Tests: No results for input(s): AST, ALT, ALKPHOS, BILITOT, PROT, ALBUMIN in the last 168 hours. No results for input(s): LIPASE, AMYLASE in the last 168 hours. No results for input(s): AMMONIA in the last 168 hours. Coagulation Profile: No results for input(s): INR, PROTIME in the last 168 hours. Cardiac Enzymes: No results for input(s): CKTOTAL, CKMB, CKMBINDEX, TROPONINI in the last 168 hours. BNP (last 3 results) No results for input(s): PROBNP in the last 8760 hours. HbA1C: No results for input(s): HGBA1C in the last 72 hours. CBG: Recent Labs  Lab 06/07/17 0733 06/08/17 0746  06/09/17 0800 06/10/17 0803 06/11/17 0802  GLUCAP 95 96 96 100* 106*   Lipid Profile: No results for input(s): CHOL, HDL, LDLCALC, TRIG, CHOLHDL, LDLDIRECT in the last 72 hours. Thyroid Function Tests: No results for input(s): TSH, T4TOTAL, FREET4, T3FREE, THYROIDAB in the last 72 hours. Anemia Panel: No results for input(s): VITAMINB12, FOLATE, FERRITIN, TIBC, IRON, RETICCTPCT in the last 72 hours. Sepsis Labs: No results for input(s): PROCALCITON, LATICACIDVEN in the last 168 hours.  No results found for this or any previous visit (from the past 240 hour(s)).     Radiology Studies: No results found.    Scheduled Meds: . enoxaparin (LOVENOX) injection  40 mg Subcutaneous Q24H  . sodium chloride flush  3 mL Intravenous Q12H   Continuous Infusions: . sodium chloride 10 mL/hr at 06/03/17 0357  .  ceFAZolin (ANCEF) IV Stopped (06/11/17 7948)  LOS: 20 days    Time spent: 30 minutes   Dessa Phi, DO [facility] [email or web address] Password [email or web address] 06/11/2017, 1:36 PM

## 2017-06-11 NOTE — Progress Notes (Addendum)
Physical Therapy Treatment Patient Details Name: Hunter MountsDavid Anthony Tolson MRN: [id number] DOB: 1985-10-11 Today's Date: 06/11/2017    History of Present Illness Hunter [name] is a 32 y.o. male with medical history significant for IVDA, recent hospitalization for right forearm deep abscess and strep pyogenes bacteremia, hepatitis C who presented to ED with reports of shortness of breath for past 3-4 days prior to the admission associated with chest pain especially when taking a deep breath. MRI showed discitis/osteomyelitis of lumbar spine; Sepsis (HCC) / Osteomyelitis (HCC) / Discitis of lumbar region / Leukocytosis    PT Comments    Pt sleeping on arrival but easily aroused and agreeable to work with therapy.  Pt with flat affect and lethargic throughout session. Min guard for ambulation this session secondary to mild instability and drifting during gait. Pt would benefit from continued skilled PT to increase safety with mobility. Will continue to follow acutely.   Follow Up Recommendations  No PT follow up     Equipment Recommendations  None recommended by PT    Recommendations for Other Services OT consult(ordered per protocol)     Precautions / Restrictions Precautions Precautions: None Precaution Comments: Back precautions for comfort Restrictions Weight Bearing Restrictions: No    Mobility  Bed Mobility Overal bed mobility: Modified Independent Bed Mobility: Rolling;Sidelying to Sit Rolling: Modified independent (Device/Increase time) Sidelying to sit: Modified independent (Device/Increase time);HOB elevated   Sit to supine: Modified independent (Device/Increase time) Sit to sidelying: Modified independent (Device/Increase time) General bed mobility comments: Good log rolling technique to prevent back pain.  Transfers Overall transfer level: Needs assistance Equipment used: None Transfers: Sit to/from Stand Sit to Stand: Supervision         General transfer  comment: supervision for safety; pt with guarded movements when standing due to back pain  Ambulation/Gait Ambulation/Gait assistance: Min guard Ambulation Distance (Feet): 500 Feet Assistive device: None Gait Pattern/deviations: Step-through pattern;Decreased stride length;Antalgic;Drifts right/left Gait velocity: decreased Gait velocity interpretation: Below normal speed for age/gender General Gait Details: Min guard this session, as pt seemed unsteady and drifting with gait.    Stairs            Wheelchair Mobility    Modified Rankin (Stroke Patients Only)       Balance Overall balance assessment: Modified Independent Sitting-balance support: Feet supported;No upper extremity supported Sitting balance-Leahy Scale: Good Sitting balance - Comments: Pt able to don socks while seated in chair. Reports increased pain. Postural control: Right lateral lean Standing balance support: No upper extremity supported;During functional activity Standing balance-Leahy Scale: Good Standing balance comment: pt able to ambulate w/o AD.                             Cognition Arousal/Alertness: Awake/alert Behavior During Therapy: Flat affect Overall Cognitive Status: Within Functional Limits for tasks assessed                                 General Comments: Flat affect and fatigued this session. Pt reported poor sleep and was recently woken from nap.      Exercises      General Comments        Pertinent Vitals/Pain Pain Assessment: 0-10 Pain Score: 9  Pain Location: Back pain Pain Descriptors / Indicators: Aching Pain Intervention(s): Monitored during session    Home Living  Prior Function            PT Goals (current goals can now be found in the care plan section) Acute Rehab PT Goals Patient Stated Goal: to stay clean PT Goal Formulation: With patient Time For Goal Achievement: 06/23/17 Potential to  Achieve Goals: Good Progress towards PT goals: Progressing toward goals    Frequency    Min 2X/week      PT Plan Current plan remains appropriate    Co-evaluation              AM-PAC PT "6 Clicks" Daily Activity  Outcome Measure  Difficulty turning over in bed (including adjusting bedclothes, sheets and blankets)?: None Difficulty moving from lying on back to sitting on the side of the bed? : None Difficulty sitting down on and standing up from a chair with arms (e.g., wheelchair, bedside commode, etc,.)?: None Help needed moving to and from a bed to chair (including a wheelchair)?: A Little Help needed walking in hospital room?: A Little Help needed climbing 3-5 steps with a railing? : A Little 6 Click Score: 21    End of Session Equipment Utilized During Treatment: Gait belt Activity Tolerance: Patient tolerated treatment well Patient left: with call bell/phone within reach;in bed;with family/visitor present(sitting at EOB) Nurse Communication: Mobility status PT Visit Diagnosis: Unsteadiness on feet (R26.81);Other abnormalities of gait and mobility (R26.89);Pain Pain - part of body: (back)     Time: 1610-9604 PT Time Calculation (min) (ACUTE ONLY): 13 min  Charges:  $Gait Training: 8-22 mins                    G Codes:       Kallie Locks, [place] Pager [phone] Acute Rehab   Sheral Apley 06/11/2017, 1:20 PM

## 2017-06-12 LAB — GLUCOSE, CAPILLARY: GLUCOSE-CAPILLARY: 118 mg/dL — AB (ref 65–99)

## 2017-06-12 NOTE — Progress Notes (Addendum)
PROGRESS NOTE    Hunter [name] [facility]  XIH:[id number] DOB: [birth year] DOA: 05/22/2017 PCP: Patient, No Pcp Per     Brief Narrative:  Zaylyn Bergdoll is a 32 yo male with history of IV drug use, prior hand infection and group A streptococcal bacteremia, status post surgery in November discharged on Augmentin which he stopped taking now admitted with MSSA bacteremia and sepsis, lumbar disc infection with psoas abscess.   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 No epidural abscess. -Blood cultures grew MSSA, repeat blood cultures with no growth to date -TEE did not show any evidence of definitive vegetation -CSW following, not appropriate for discharge home with PICC line given IV drug abuse, will either need SNF or stay inpatient to complete antibiotic course -ID consulted and now signed off. See last progress note 12/21 for recommendations. Continue IV ancef for 6 weeks, last day 1/26. Weekly CBC, BMP, CRP, ESR. Call ID back near end of antibiotics.   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 No heroin or opiates in a long time.   -Patient planning to eventually move back to [place] to live with his dad -Appreciate Dr. Autumn Patty assistance with possibly starting suboxone when pain is better controlled. They will follow up   HCV -+AB, Genotype 2b, HCV RNA 6.547, hep c quantitative 3,520,  000 -Follow up with ID   Normocytic anemia -Stable, baseline Hgb ~11   DVT prophylaxis: lovenox Code Status: full Family Communication: at bedside sleeping  Disposition Plan: inpatient until completion of IV antibiotics    Consultants:   ID  Cardiology  IMTS for suboxone   Procedures:   TEE 12/21, negative for vegetation   Antimicrobials:  Anti-infectives (From admission, onward)   Start     Dose/Rate Route Frequency Ordered Stop   05/23/17 1700  ceFAZolin (ANCEF) IVPB 2g/100 mL premix     2 g 200 mL/hr over 30 Minutes Intravenous Every 8 hours 05/23/17 1628 07/03/17 2359   05/23/17 0400  vancomycin (VANCOCIN) IVPB 1000 mg/200 mL premix  Status:  Discontinued     1,000 mg 200 mL/hr over 60 Minutes Intravenous Every 8 hours 05/22/17 1733 05/23/17 1455   05/22/17 2000  piperacillin-tazobactam (ZOSYN) IVPB 3.375 g  Status:  Discontinued     3.375 g 12.5 mL/hr over 240 Minutes Intravenous Every 8 hours 05/22/17 1733 05/23/17 1628   05/22/17 1745  vancomycin (VANCOCIN) 500 mg in sodium chloride 0.9 % 100 mL IVPB     500 mg 100 mL/hr over 60 Minutes Intravenous  Once 05/22/17 1733 05/22/17 2042   05/22/17 1115  vancomycin (VANCOCIN) IVPB 1000 mg/200 mL premix     1,000 mg 200 mL/hr over 60 Minutes Intravenous  Once 05/22/17 1100 05/22/17 1220   05/22/17 1115  piperacillin-tazobactam (ZOSYN) IVPB 3.375 g     3.375 g 100 mL/hr over 30 Minutes Intravenous  Once 05/22/17 1100 05/22/17 1744   05/22/17 0000  azithromycin (ZITHROMAX) 250 MG tablet     250 mg Oral Daily 05/22/17 1051         Subjective: No new events. Feeling about the same.   Objective: Vitals:   06/11/17 0429 06/11/17 1448 06/11/17 2047 06/12/17 0700  BP: 109/63 104/65 113/62 116/71  Pulse: 100 98 94 77  Resp: _0 Temp: 98.5 F (36.9 C) 99 F (37.2 C) 99.4 F (37.4 C) 97.8 F (36.6 C)  TempSrc:  Oral  Oral  SpO2: 97% 98% 98% 100%  Weight: 75 kg (165 lb 6.4 oz)   75.2 kg (165 lb 12.8  oz)  Height:        Intake/Output Summary (Last 24 hours) at 06/12/2017 1159 Last data filed at 06/12/2017 1017 Gross per 24 hour  Intake 840 ml  Output -  Net 840 ml   Filed Weights   06/10/17 0544 06/11/17 0429 06/12/17 0700  Weight: 73.4 kg (161 lb 14.4 oz) 75 kg (165 lb 6.4 oz) 75.2 kg (165 lb 12.8 oz)    Examination:  General exam: Appears calm  Respiratory system: Clear to auscultation. Respiratory effort normal. Cardiovascular system: S1 & S2 heard, RRR No JVD, murmurs, rubs, gallops or clicks. No pedal edema. Gastrointestinal system: Abdomen is nondistended, soft and nontender. No organomegaly or masses felt. Normal bowel sounds heard. Central nervous system: Alert and oriented. No focal neurological deficits. Extremities: Symmetric 5 x 5 power. Skin: No rashes, lesions or ulcers Psychiatry: Judgement and insight appear normal  Data Reviewed: I have personally reviewed following labs and imaging studies  CBC: Recent Labs  Lab 06/11/17 0434  WBC 8.1  HGB 8.8*  HCT 28.6*  MCV 84.9  PLT 953   Basic Metabolic Panel: Recent Labs  Lab 06/11/17 0434  NA 132*  K 4.1  CL 99*  CO2 25  GLUCOSE 113*  BUN 14  CREATININE 0.66  CALCIUM 8.6*   GFR: Estimated Creatinine Clearance: 142.3 mL/min (by C-G formula based on SCr of 0.66 mg/dL). Liver Function Tests: No results for input(s): AST, ALT, ALKPHOS, BILITOT, PROT, ALBUMIN in the last 168 hours. No results for input(s): LIPASE, AMYLASE in the last 168 hours. No results for input(s): AMMONIA in the last 168 hours. Coagulation Profile: No results for input(s): INR, PROTIME in the last 168 hours. Cardiac Enzymes: No results for input(s): CKTOTAL, CKMB, CKMBINDEX, TROPONINI in the last 168 hours. BNP (last 3 results) No results for input(s): PROBNP in the last 8760 hours. HbA1C: No results for input(s): HGBA1C in the last 72 hours. CBG: Recent Labs  Lab 06/08/17 0746 06/09/17 0800 06/10/17 0803 06/11/17 0802  06/12/17 0748  GLUCAP 96 96 100* 106* 118*   Lipid Profile: No results for input(s): CHOL, HDL, LDLCALC, TRIG, CHOLHDL, LDLDIRECT in the last 72 hours. Thyroid Function Tests: No results for input(s): TSH, T4TOTAL, FREET4, T3FREE, THYROIDAB in the last 72 hours. Anemia Panel: No results for input(s): VITAMINB12, FOLATE, FERRITIN, TIBC, IRON, RETICCTPCT in the last 72 hours. Sepsis Labs: No results for input(s): PROCALCITON, LATICACIDVEN in the last 168 hours.  No results found for this or any previous visit (from the past 240 hour(s)).     Radiology Studies: No results found.    Scheduled Meds: . enoxaparin (LOVENOX) injection  40 mg Subcutaneous Q24H  . sodium chloride flush  3 mL Intravenous Q12H   Continuous Infusions: . sodium chloride 10 mL/hr at 06/03/17 0357  .  ceFAZolin (ANCEF) IV Stopped (06/12/17 0740)  LOS: 21 days    Time spent: 30 minutes   Dessa Phi, DO [facility] [email or web address] Password [email or web address] 06/12/2017, 11:59 AM

## 2017-06-13 LAB — GLUCOSE, CAPILLARY: GLUCOSE-CAPILLARY: 93 mg/dL (ref 65–99)

## 2017-06-13 NOTE — Progress Notes (Addendum)
PROGRESS NOTE    Hunter [name] [facility]  OFH:[id number] DOB: [birth year] DOA: 05/22/2017 PCP: Patient, No Pcp Per     Brief Narrative:  Hunter [name] is a 32 yo male with history of IV drug use, prior hand infection and group A streptococcal bacteremia, status post surgery in November discharged on Augmentin which he stopped taking now admitted with MSSA bacteremia and sepsis, lumbar disc infection with psoas abscess.   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 No epidural abscess. -Blood cultures grew MSSA, repeat blood cultures with no growth to date -TEE did not show any evidence of definitive vegetation -CSW following, not appropriate for discharge home with PICC line given IV drug abuse, will either need SNF or stay inpatient to complete antibiotic course -ID consulted and now signed off. See last progress note 12/21 for recommendations. Continue IV ancef for 6 weeks, last day 1/26. Weekly CBC, BMP, CRP, ESR. Call ID back near end of antibiotics.   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 No heroin or opiates in a long time.   -Patient planning to eventually move back to [place] to live with his dad -Appreciate Dr. Autumn Patty assistance with possibly starting suboxone when pain is better controlled. They will follow up   HCV -+AB, Genotype 2b, HCV RNA 6.547, hep c quantitative 3,520,  000 -Follow up with ID   Normocytic anemia -Stable, baseline Hgb ~11   DVT prophylaxis: lovenox Code Status: full Family Communication: at bedside  Disposition Plan: inpatient until completion of IV antibiotics    Consultants:   ID  Cardiology  IMTS for suboxone   Procedures:   TEE 12/21, negative for vegetation   Antimicrobials:  Anti-infectives (From admission, onward)   Start     Dose/Rate Route Frequency Ordered Stop   05/23/17 1700  ceFAZolin (ANCEF) IVPB 2g/100 mL premix     2 g 200 mL/hr over 30 Minutes Intravenous Every 8 hours 05/23/17 1628 07/03/17 2359   05/23/17 0400  vancomycin (VANCOCIN) IVPB 1000 mg/200 mL premix  Status:  Discontinued     1,000 mg 200 mL/hr over 60 Minutes Intravenous Every 8 hours 05/22/17 1733 05/23/17 1455   05/22/17 2000  piperacillin-tazobactam (ZOSYN) IVPB 3.375 g  Status:  Discontinued     3.375 g 12.5 mL/hr over 240 Minutes Intravenous Every 8 hours 05/22/17 1733 05/23/17 1628   05/22/17 1745  vancomycin (VANCOCIN) 500 mg in sodium chloride 0.9 % 100 mL IVPB     500 mg 100 mL/hr over 60 Minutes Intravenous  Once 05/22/17 1733 05/22/17 2042   05/22/17 1115  vancomycin (VANCOCIN) IVPB 1000 mg/200 mL premix     1,000 mg 200 mL/hr over 60 Minutes Intravenous  Once 05/22/17 1100 05/22/17 1220   05/22/17 1115  piperacillin-tazobactam (ZOSYN) IVPB 3.375 g     3.375 g 100 mL/hr over 30 Minutes Intravenous  Once 05/22/17 1100 05/22/17 1744   05/22/17 0000  azithromycin (ZITHROMAX) 250 MG tablet     250 mg Oral Daily 05/22/17 1051         Subjective: No new events. No complaints. Ambulating and eating.   Objective: Vitals:   06/12/17 0700 06/12/17 1558 06/12/17 2248 06/13/17 0538  BP: 116/71 114/73 110/69 111/61  Pulse: 77 99 90 100  Resp: 18 20 18 18   Temp: 97.8 F (36.6 C) 98.1 F (36.7 C) 98.1 F (36.7 C) 98.3 F (36.8 C)  TempSrc: Oral     SpO2: 100% 98% 100% 100%  Weight: 75.2 kg (165 lb 12.8 oz)   75.5 kg (166  lb 7.2 oz)  Height:        Intake/Output Summary (Last 24 hours) at 06/13/2017 1052 Last data filed at 06/13/2017 0606 Gross per 24 hour  Intake 661 ml  Output -  Net 661 ml   Filed Weights   06/11/17 0429 06/12/17 0700 06/13/17 0538  Weight: 75 kg (165 lb 6.4 oz) 75.2 kg (165 lb 12.8 oz) 75.5 kg (166 lb 7.2 oz)    Examination:  General exam: Appears calm  Respiratory system: Clear to auscultation. Respiratory effort normal. Cardiovascular system: S1 & S2 heard, RRR No JVD, murmurs, rubs, gallops or clicks. No pedal edema. Gastrointestinal system: Abdomen is nondistended, soft and nontender. No organomegaly or masses felt. Normal bowel sounds heard. Central nervous system: Alert and oriented. No focal neurological deficits. Extremities: Symmetric 5 x 5 power. Skin: No rashes, lesions or ulcers Psychiatry: Judgement and insight appear normal  Data Reviewed: I have personally reviewed following labs and imaging studies  CBC: Recent Labs  Lab 06/11/17 0434  WBC 8.1  HGB 8.8*  HCT 28.6*  MCV 84.9  PLT 759   Basic Metabolic Panel: Recent Labs  Lab 06/11/17 0434  NA 132*  K 4.1  CL 99*  CO2 25  GLUCOSE 113*  BUN 14  CREATININE 0.66  CALCIUM 8.6*   GFR: Estimated Creatinine Clearance: 142.9 mL/min (by C-G formula based on SCr of 0.66 mg/dL). Liver Function Tests: No results for input(s): AST, ALT, ALKPHOS, BILITOT, PROT, ALBUMIN in the last 168 hours. No results for input(s): LIPASE, AMYLASE in the last 168 hours. No results for input(s): AMMONIA in the last 168 hours. Coagulation Profile: No results for input(s): INR, PROTIME in the last 168 hours. Cardiac Enzymes: No results for input(s): CKTOTAL, CKMB, CKMBINDEX, TROPONINI in the last 168 hours. BNP (last 3 results) No results for input(s): PROBNP in the last 8760 hours. HbA1C: No results for input(s): HGBA1C in the last 72 hours. CBG: Recent Labs  Lab 06/09/17 0800 06/10/17 0803 06/11/17 0802  06/12/17 0748 06/13/17 0801  GLUCAP 96 100* 106* 118* 93   Lipid Profile: No results for input(s): CHOL, HDL, LDLCALC, TRIG, CHOLHDL, LDLDIRECT in the last 72 hours. Thyroid Function Tests: No results for input(s): TSH, T4TOTAL, FREET4, T3FREE, THYROIDAB in the last 72 hours. Anemia Panel: No results for input(s): VITAMINB12, FOLATE, FERRITIN, TIBC, IRON, RETICCTPCT in the last 72 hours. Sepsis Labs: No results for input(s): PROCALCITON, LATICACIDVEN in the last 168 hours.  No results found for this or any previous visit (from the past 240 hour(s)).     Radiology Studies: No results found.    Scheduled Meds: . enoxaparin (LOVENOX) injection  40 mg Subcutaneous Q24H  . sodium chloride flush  3 mL Intravenous Q12H   Continuous Infusions: . sodium chloride 10 mL/hr at 06/03/17 0357  .  ceFAZolin (ANCEF) IV Stopped (06/13/17 1638)  LOS: 22 days    Time spent: 30 minutes   Dessa Phi, DO [facility] [email or web address] Password [email or web address] 06/13/2017, 10:52 AM

## 2017-06-14 LAB — GLUCOSE, CAPILLARY: Glucose-Capillary: 87 mg/dL (ref 65–99)

## 2017-06-14 NOTE — Progress Notes (Signed)
Received a call from CCMD that the patient heart rate had increased into the 140's and was sustaining.  Went to the patient's room and patient was laying in the bed with a woman in the chair beside of him.  Patient seemed more jittery than normal and I informed him that I had received a call about his heart increasing again.  He chuckled and stated he had been to the bathroom again.  Will continue to monitor.

## 2017-06-14 NOTE — Progress Notes (Signed)
Patient complained of IV infiltrated. Went to assess and removed IV d/t redness and swelling. Noticed at that time a piece of tape on the RUA with redness and swelling. Patient stated that he had another IV site there that also infiltrated. There was no gauze under the tape only tape. The site felt hard.Will continue to monitor.

## 2017-06-14 NOTE — Progress Notes (Signed)
Received call from CCMD about patient heart rate increasing to the 140's.  Went to patient room and patient was in the restroom with door open and 2 females and male present in the room.  Asked the patient what he was doing and he said using the rest room and going to take a shower.  Educated the patient that he has to tell me he wants a shower so I can secure his IV and I also have to inform CCMD that he will be off the telemetry while in the shower.  Patient stated verbally that he understood.  Will continue to monitor.

## 2017-06-14 NOTE — Progress Notes (Addendum)
PROGRESS NOTE    Hunter [name] [facility]  BWG:[id number] DOB: 1986-03-30 DOA: 05/22/2017 PCP: Patient, No Pcp Per     Brief Narrative:  Hunter [name] is a 32 yo male with history of IV drug use, prior hand infection and group A streptococcal bacteremia, status post surgery in November discharged on Augmentin which he stopped taking now admitted with MSSA bacteremia and sepsis, lumbar disc infection with psoas abscess.   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 No epidural abscess. -Blood cultures grew MSSA, repeat blood cultures with no growth to date -TEE did not show any evidence of definitive vegetation -CSW following, not appropriate for discharge home with PICC line given IV drug abuse, will either need SNF or stay inpatient to complete antibiotic course -ID consulted and now signed off. See last progress note 12/21 for recommendations. Continue IV ancef for 6 weeks, last day 1/26. Weekly CBC, BMP, CRP, ESR. Call ID back near end of antibiotics.   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 No heroin or opiates in a long time.  -Patient planning to eventually move back to [place] to live with his dad -Appreciate Dr. Autumn Patty assistance with possibly starting suboxone when pain is better controlled. They will follow up   HCV -+AB, Genotype 2b, HCV RNA 6.547, hep c quantitative 3,520, 000 -Follow  up with ID   Normocytic anemia -Stable, baseline Hgb ~11   DVT prophylaxis: lovenox Code Status: full Family Communication: at bedside sleeping  Disposition Plan: inpatient until completion of IV antibiotics    Consultants:   ID  Cardiology  IMTS for suboxone   Procedures:   TEE 12/21, negative for vegetation   Antimicrobials:  Anti-infectives (From admission, onward)   Start     Dose/Rate Route Frequency Ordered Stop   05/23/17 1700  ceFAZolin (ANCEF) IVPB 2g/100 mL premix     2 g 200 mL/hr over 30 Minutes Intravenous Every 8 hours 05/23/17 1628 07/03/17 2359   05/23/17 0400  vancomycin (VANCOCIN) IVPB 1000 mg/200 mL premix  Status:  Discontinued     1,000 mg 200 mL/hr over 60 Minutes Intravenous Every 8 hours 05/22/17 1733 05/23/17 1455   05/22/17 2000  piperacillin-tazobactam (ZOSYN) IVPB 3.375 g  Status:  Discontinued     3.375 g 12.5 mL/hr over 240 Minutes Intravenous Every 8 hours 05/22/17 1733 05/23/17 1628   05/22/17 1745  vancomycin (VANCOCIN) 500 mg in sodium chloride 0.9 % 100 mL IVPB     500 mg 100 mL/hr over 60 Minutes Intravenous  Once 05/22/17 1733 05/22/17 2042   05/22/17 1115  vancomycin (VANCOCIN) IVPB 1000 mg/200 mL premix     1,000 mg 200 mL/hr over 60 Minutes Intravenous  Once 05/22/17 1100 05/22/17 1220   05/22/17 1115  piperacillin-tazobactam (ZOSYN) IVPB 3.375 g     3.375 g 100 mL/hr over 30 Minutes Intravenous  Once 05/22/17 1100 05/22/17 1744   05/22/17 0000  azithromycin (ZITHROMAX) 250 MG tablet     250 mg Oral Daily 05/22/17 1051         Subjective: No new events. No complaints. Got tachycardic yesterday when in bathroom, no symptoms. HR better today   Objective: Vitals:   06/13/17 0538 06/13/17 1456 06/13/17 2149 06/14/17 0453  BP: 111/61 116/68 114/72 104/62  Pulse: 100 92 (!) 106 96  Resp: 18 18 18 18   Temp: 98.3 F (36.8 C) 98.3 F (36.8 C) 99.4 F (37.4 C) 98.8 F (37.1 C)  TempSrc:  Oral    SpO2: 100% 97% 96% 96%   Weight: 75.5 kg (166 lb 7.2 oz)   72.3 kg (159 lb 6.3 oz)  Height:        Intake/Output Summary (Last 24 hours) at 06/14/2017 1141 Last data filed at 06/14/2017 0946 Gross per 24 hour  Intake 802 ml  Output -  Net 802 ml   Filed Weights   06/12/17 0700 06/13/17 0538 06/14/17 0453  Weight: 75.2 kg (165 lb 12.8 oz) 75.5 kg (166 lb 7.2 oz) 72.3 kg (159 lb 6.3 oz)    Examination:  General exam: Appears calm  Respiratory system: Clear to auscultation. Respiratory effort normal. Cardiovascular system: S1 & S2 heard, tachycardic rate 100s, No JVD, murmurs, rubs, gallops or clicks. No pedal edema. Gastrointestinal system: Abdomen is nondistended, soft and nontender. No organomegaly or masses felt. Normal bowel sounds heard. Central nervous system: Alert and oriented. No focal neurological deficits. Extremities: Symmetric 5 x 5 power. Skin: No rashes, lesions or ulcers Psychiatry: Judgement and insight appear normal  Data Reviewed: I have personally reviewed following labs and imaging studies  CBC: Recent Labs  Lab 06/11/17 0434  WBC 8.1  HGB 8.8*  HCT 28.6*  MCV 84.9  PLT 859   Basic Metabolic Panel: Recent Labs  Lab 06/11/17 0434  NA 132*  K 4.1  CL 99*  CO2 25  GLUCOSE 113*  BUN 14  CREATININE 0.66  CALCIUM 8.6*   GFR: Estimated Creatinine Clearance: 136.8 mL/min (by C-G formula based on SCr of 0.66 mg/dL). Liver Function Tests: No results for input(s): AST, ALT, ALKPHOS, BILITOT, PROT, ALBUMIN in the last 168 hours. No results for input(s): LIPASE, AMYLASE in the last 168 hours. No results for input(s): AMMONIA in the last 168 hours. Coagulation Profile: No results for input(s): INR, PROTIME in the last 168 hours. Cardiac Enzymes: No results for input(s): CKTOTAL, CKMB, CKMBINDEX, TROPONINI in the last 168 hours. BNP (last 3 results) No results for input(s): PROBNP in the last 8760 hours. HbA1C: No results for input(s): HGBA1C in the last 72  hours. CBG: Recent Labs  Lab 06/10/17 0803 06/11/17 0802 06/12/17 0748 06/13/17 0801 06/14/17 0745  GLUCAP 100* 106* 118* 93 87   Lipid Profile: No results for input(s): CHOL, HDL, LDLCALC, TRIG, CHOLHDL, LDLDIRECT in the last 72 hours. Thyroid Function Tests: No results for input(s): TSH, T4TOTAL, FREET4, T3FREE, THYROIDAB in the last 72 hours. Anemia Panel: No results for input(s): VITAMINB12, FOLATE, FERRITIN, TIBC, IRON, RETICCTPCT in the last 72 hours. Sepsis Labs: No results for input(s): PROCALCITON, LATICACIDVEN in the last 168 hours.  No results found for this or any previous visit (from the past 240 hour(s)).     Radiology Studies: No results found.    Scheduled Meds: . enoxaparin (LOVENOX) injection  40 mg Subcutaneous Q24H  . sodium chloride flush  3 mL Intravenous Q12H   Continuous Infusions: . sodium chloride 10 mL/hr at 06/03/17 0357  .  ceFAZolin (ANCEF) IV 2 g (06/14/17 0610)     LOS: 23 days    Time spent: 30 minutes   Dessa Phi, DO [facility] [email or web address] Password [email or web address] 06/14/2017, 11:41 AM

## 2017-06-15 LAB — GLUCOSE, CAPILLARY: Glucose-Capillary: 111 mg/dL — ABNORMAL HIGH (ref 65–99)

## 2017-06-15 NOTE — Progress Notes (Signed)
CSW confirmed with medical director that patient will continue to remain in the hospital for the duration of his IV antibiotics.   CSW signing off.  Osborne Cascoadia Kie Calvin LCSWA [phone]

## 2017-06-15 NOTE — Progress Notes (Addendum)
PROGRESS NOTE    Antwoin Lackey [facility]  VCB:[id number] DOB: 1986-04-06 DOA: 05/22/2017 PCP: Patient, No Pcp Per     Brief Narrative:  Hunter [name] is a 32 yo male with history of IV drug use, prior hand infection and group A streptococcal bacteremia, status post surgery in November discharged on Augmentin which he stopped taking now admitted with MSSA bacteremia and sepsis, lumbar disc infection with psoas abscess. He is currently being treated with 6 week course of IV ancef.   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 No epidural abscess. -Blood cultures grew MSSA, repeat blood cultures with no growth to date -TEE did not show any evidence of definitive vegetation -CSW following, not appropriate for discharge home with PICC line given IV drug abuse, will either need SNF or stay inpatient to complete antibiotic course -ID consulted and now signed off. See last progress note 12/21 for recommendations. Continue IV ancef for 6 weeks, last day 1/26. Weekly CBC, BMP, CRP, ESR. Call ID back near end of antibiotics.   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 No heroin or opiates in a long time.  -Patient planning to eventually move back to [place] to live with his dad -Appreciate Dr. Autumn Patty assistance with possibly starting suboxone when pain is better controlled. They will follow up   HCV -+AB,  Genotype 2b, HCV RNA 6.547, hep c quantitative 3,520, 000 -Follow up with ID   Normocytic anemia -Stable, baseline Hgb ~11   DVT prophylaxis: lovenox Code Status: full Family Communication: at bedside sleeping  Disposition Plan: inpatient until completion of IV antibiotics    Consultants:   ID  Cardiology  IMTS for suboxone   Procedures:   TEE 12/21, negative for vegetation   Antimicrobials:  Anti-infectives (From admission, onward)   Start     Dose/Rate Route Frequency Ordered Stop   05/23/17 1700  ceFAZolin (ANCEF) IVPB 2g/100 mL premix     2 g 200 mL/hr over 30 Minutes Intravenous Every 8 hours 05/23/17 1628 07/03/17 2359   05/23/17 0400  vancomycin (VANCOCIN) IVPB 1000 mg/200 mL premix  Status:  Discontinued     1,000 mg 200 mL/hr over 60 Minutes Intravenous Every 8 hours 05/22/17 1733 05/23/17 1455   05/22/17 2000  piperacillin-tazobactam (ZOSYN) IVPB 3.375 g  Status:  Discontinued     3.375 g 12.5 mL/hr over 240 Minutes Intravenous Every 8 hours 05/22/17 1733 05/23/17 1628   05/22/17 1745  vancomycin (VANCOCIN) 500 mg in sodium chloride 0.9 % 100 mL IVPB     500 mg 100 mL/hr over 60 Minutes Intravenous  Once 05/22/17 1733 05/22/17 2042   05/22/17 1115  vancomycin (VANCOCIN) IVPB 1000 mg/200 mL premix     1,000 mg 200 mL/hr over 60 Minutes Intravenous  Once 05/22/17 1100 05/22/17 1220   05/22/17 1115  piperacillin-tazobactam (ZOSYN) IVPB 3.375 g     3.375 g 100 mL/hr over 30 Minutes  Intravenous  Once 05/22/17 1100 05/22/17 1744   05/22/17 0000  azithromycin (ZITHROMAX) 250 MG tablet     250 mg Oral Daily 05/22/17 1051         Subjective: No new events. No complaints. Has been walking with PT and doing better in terms of pain and ambulation.   Objective: Vitals:   06/14/17 1445 06/14/17 1615 06/14/17 2118 06/15/17 0625  BP: 105/71  111/62 110/63  Pulse: (!) 108  (!) 109 88  Resp: 18  16 17   Temp: 100.2 F (37.9 C) 98.1 F (36.7 C) (!) 100.4 F (38  C) 98.5 F (36.9 C)  TempSrc: Oral  Oral Oral  SpO2: 96%  96% 98%  Weight:    74.7 kg (164 lb 10.9 oz)  Height:        Intake/Output Summary (Last 24 hours) at 06/15/2017 1203 Last data filed at 06/15/2017 8119 Gross per 24 hour  Intake 260 ml  Output 300 ml  Net -40 ml   Filed Weights   06/13/17 0538 06/14/17 0453 06/15/17 0625  Weight: 75.5 kg (166 lb 7.2 oz) 72.3 kg (159 lb 6.3 oz) 74.7 kg (164 lb 10.9 oz)    Examination:  General exam: Appears calm  Respiratory system: Clear to auscultation. Respiratory effort normal. Cardiovascular system: S1 & S2 heard, tachycardic rate 110s, No JVD, murmurs, rubs, gallops or clicks. No pedal edema. Gastrointestinal system: Abdomen is nondistended, soft and nontender. No organomegaly or masses felt. Normal bowel sounds heard. Central nervous system: Alert and oriented. No focal neurological deficits. Extremities: Symmetric 5 x 5 power. Skin: No rashes, lesions or ulcers Psychiatry: Judgement and insight appear normal  Data Reviewed: I have personally reviewed following labs and imaging studies  CBC: Recent Labs  Lab 06/11/17 0434  WBC 8.1  HGB 8.8*  HCT 28.6*  MCV 84.9  PLT 147   Basic Metabolic Panel: Recent Labs  Lab 06/11/17 0434  NA 132*  K 4.1  CL 99*  CO2 25  GLUCOSE 113*  BUN 14  CREATININE 0.66  CALCIUM 8.6*   GFR: Estimated Creatinine Clearance: 141.4 mL/min (by C-G formula based on SCr of 0.66 mg/dL). Liver Function Tests: No results for input(s): AST, ALT, ALKPHOS, BILITOT, PROT, ALBUMIN in the last 168 hours. No results for input(s): LIPASE, AMYLASE in the last 168 hours. No results for input(s): AMMONIA in the last 168 hours. Coagulation Profile: No results for input(s): INR, PROTIME in the last 168 hours. Cardiac Enzymes: No results for input(s): CKTOTAL, CKMB, CKMBINDEX, TROPONINI in the last 168 hours. BNP (last 3 results) No results for input(s): PROBNP in the last 8760 hours. HbA1C: No results  for input(s): HGBA1C in the last 72 hours. CBG: Recent Labs  Lab 06/11/17 0802 06/12/17 0748 06/13/17 0801 06/14/17 0745 06/15/17 0748  GLUCAP 106* 118* 93 87 111*   Lipid Profile: No results for input(s): CHOL, HDL, LDLCALC, TRIG, CHOLHDL, LDLDIRECT in the last 72 hours. Thyroid Function Tests: No results for input(s): TSH, T4TOTAL, FREET4, T3FREE, THYROIDAB in the last 72 hours. Anemia Panel: No results for input(s): VITAMINB12, FOLATE, FERRITIN, TIBC, IRON, RETICCTPCT in the last 72 hours. Sepsis Labs: No results for input(s): PROCALCITON, LATICACIDVEN in the last 168 hours.  No results found for this or any previous visit (from the past 240 hour(s)).     Radiology Studies: No results found.    Scheduled Meds: . enoxaparin (LOVENOX) injection  40 mg Subcutaneous Q24H  . sodium chloride flush  3 mL Intravenous  Q12H   Continuous Infusions: . sodium chloride 10 mL/hr at 06/03/17 0357  .  ceFAZolin (ANCEF) IV 2 g (06/15/17 0629)     LOS: 24 days    Time spent: 30 minutes   Dessa Phi, DO [facility] [email or web address] Password [facility] 06/15/2017, 12:03 PM

## 2017-06-15 NOTE — Progress Notes (Addendum)
    I re-evaluated Hunter [name] this morning, he is a 32 year old person who uses drugs: polysubstances including cocaine, meth, and heroin. He has been admitted to [facility] since 12/15 for vertebral osteomyelitis of L2-3, MSSA bacteremia, and infectious paraspinal myositis/abscess which are all complications of injection drug use. He was in intense back pain the last I saw him, requiring high doses of IV dilaudid. His pain has improved nicely over the last two weeks, he is currently using Oxycodone 10mg  q4-6 hours, yesterday he required five doses. He is planning on staying in hospital for three more weeks to complete an antibiotic course. After discharge, he is planning on immediately moving to [place] to live with his father and go through drug treatment. He tells me that his father has made treatment a condition of his move. [name] remains very interested in starting Buprenorphine while he is in the hospital.   I think his acute pain is still a little too intense right now to transition to Buprenorphine. I would recommend continuing with current treatment, including prn oxycodone. I will visit with him early next week to check in. He will need to be able to go 12 hours without oxycodone, then we will start Buprenorphine which will manage withdrawal symptoms, support residual chronic pain, and manage opioid cravings. I can then provide him a one week prescription for Buprenorphine at discharge. I encouraged him to start looking now for a [facility] provider or treatment center in [place] who will pick up his care quickly once he makes that move, which he agrees to do.

## 2017-06-15 NOTE — Progress Notes (Addendum)
Physical Therapy Treatment and Discharge Patient Details Name: Hunter [name] MRN: [id number] DOB: [birth year] Today's Date: 06/15/2017    History of Present Illness Hunter [name] is a 32 y.o. male with medical history significant for IVDA, recent hospitalization for right forearm deep abscess and strep pyogenes bacteremia, hepatitis C who presented to ED with reports of shortness of breath for past 3-4 days prior to the admission associated with chest pain especially when taking a deep breath. MRI showed discitis/osteomyelitis of lumbar spine; Sepsis ([place]) / Osteomyelitis ([place]) / Discitis of lumbar region / Leukocytosis    PT Comments    Pt asleep on entry however was willing to participate with PT once awoken. Pt has reached modified independence with his functional mobility and as such has met all of his PT goals. Pt no longer needs skilled PT and has been encouraged to walk multiple times a day with his nurse techs to keep his strength and endurance. If pt functional mobility needs change please reorder PT services. PT signing off.    Follow Up Recommendations  No PT follow up     Equipment Recommendations  None recommended by PT    Recommendations for Other Services OT consult(ordered per protocol)     Precautions / Restrictions Precautions Precautions: None Precaution Comments: Back precautions for comfort Restrictions Weight Bearing Restrictions: No    Mobility  Bed Mobility Overal bed mobility: Modified Independent Bed Mobility: Sidelying to Sit;Rolling Rolling: Modified independent (Device/Increase time) Sidelying to sit: Modified independent (Device/Increase time);HOB elevated       General bed mobility comments: able to come all the way over onto L hip before coming to sitting  Transfers Overall transfer level: Needs assistance Equipment used: None Transfers: Sit to/from Stand Sit to Stand: Modified independent (Device/Increase time)         General  transfer comment: mod I for increased time to come to standing due to back pain  Ambulation/Gait Ambulation/Gait assistance: Modified independent (Device/Increase time) Ambulation Distance (Feet): 500 Feet Assistive device: None Gait Pattern/deviations: Step-through pattern;Decreased stride length;Antalgic;Drifts right/left Gait velocity: decreased Gait velocity interpretation: Below normal speed for age/gender General Gait Details: mod I with gait, mild drifting right/left with gait, no overt loss of balance   Stairs Stairs: Yes   Stair Management: One rail Left;Step to pattern;Alternating pattern;Forwards Number of Stairs: 4 General stair comments: Pt with slow but steady ascent/descent of 4 steps with use of single railing  Wheelchair Mobility    Modified Rankin (Stroke Patients Only)       Balance Overall balance assessment: Modified Independent Sitting-balance support: Feet supported;No upper extremity supported Sitting balance-Leahy Scale: Good   Postural control: Right lateral lean Standing balance support: No upper extremity supported;During functional activity Standing balance-Leahy Scale: Good                              Cognition Arousal/Alertness: Awake/alert Behavior During Therapy: Flat affect Overall Cognitive Status: Within Functional Limits for tasks assessed                                 General Comments: Flat affect and fatigued this session. Pt reported poor sleep and was recently woken from nap.             Pertinent Vitals/Pain Pain Assessment: 0-10 Pain Score: 8  Pain Location: Back pain Pain Descriptors / Indicators: Aching Pain Intervention(s): Limited  activity within patient's tolerance;Monitored during session           PT Goals (current goals can now be found in the care plan section) Acute Rehab PT Goals Patient Stated Goal: to stay clean PT Goal Formulation: With patient Time For Goal Achievement:  06/23/17 Potential to Achieve Goals: Good Progress towards PT goals: Goals met/education completed, patient discharged from PT    Frequency    Min 2X/week      PT Plan Current plan remains appropriate    Co-evaluation              AM-PAC PT "6 Clicks" Daily Activity  Outcome Measure  Difficulty turning over in bed (including adjusting bedclothes, sheets and blankets)?: None Difficulty moving from lying on back to sitting on the side of the bed? : None Difficulty sitting down on and standing up from a chair with arms (e.g., wheelchair, bedside commode, etc,.)?: None Help needed moving to and from a bed to chair (including a wheelchair)?: None Help needed walking in hospital room?: None Help needed climbing 3-5 steps with a railing? : None 6 Click Score: 24    End of Session Equipment Utilized During Treatment: Gait belt Activity Tolerance: Patient tolerated treatment well Patient left: with call bell/phone within reach;in bed;with family/visitor present(sitting at EOB, physician present ) Nurse Communication: Mobility status PT Visit Diagnosis: Unsteadiness on feet (R26.81);Other abnormalities of gait and mobility (R26.89);Pain Pain - part of body: (back)     Time: 2429-9806 PT Time Calculation (min) (ACUTE ONLY): 15 min  Charges:  $Gait Training: 8-22 mins                    G Codes:       Calirose Mccance B. Migdalia Dk PT, DPT Acute Rehabilitation  [phone] Pager [phone]     [place] 06/15/2017, 1:29 PM

## 2017-06-16 NOTE — Plan of Care (Signed)
Patient still complaining of chronic back pain, encouraged patient to ambulate in halls.  Vital signs remain stable, will continue to monitor

## 2017-06-17 LAB — DRUG PROFILE, UR, 9 DRUGS (LABCORP)
AMPHETAMINES, URINE: NEGATIVE ng/mL
BARBITURATE, UR: NEGATIVE ng/mL
Benzodiazepine Quant, Ur: NEGATIVE ng/mL
CANNABINOID QUANT UR: NEGATIVE ng/mL
Cocaine (Metab.): NEGATIVE ng/mL
Methadone Screen, Urine: NEGATIVE ng/mL
Opiate Quant, Ur: POSITIVE — AB
PROPOXYPHENE, URINE: NEGATIVE ng/mL
Phencyclidine, Ur: NEGATIVE ng/mL

## 2017-06-17 NOTE — Progress Notes (Addendum)
PROGRESS NOTE    [facility]  DGL:[id number] DOB: 08/24/1985 DOA: 05/22/2017 PCP: Patient, No Pcp Per     Brief Narrative:  Daegon Deiss is a 32 yo male with history of IV drug use, prior hand infection and group A streptococcal bacteremia, status post surgery in November discharged on Augmentin which he stopped taking now admitted with MSSA bacteremia and sepsis, lumbar disc infection with psoas abscess. 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 No epidural abscess. -Blood cultures grew MSSA, repeat blood cultures with no growth to date -TEE did not show any evidence of definitive vegetation -CSW following, not appropriate for discharge home with PICC line given IV drug abuse, will either need SNF or stay inpatient to complete antibiotic course -ID consulted and now signed off. See last progress note 12/21 for recommendations. Continue IV ancef for 6 weeks, last day 1/26. Weekly CBC, BMP, CRP, ESR. Call ID back near end of antibiotics.   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 No heroin or opiates in a long time.  -Patient planning to eventually move back to [place] to live with his dad -Appreciate Dr. Autumn Patty assistance with possibly starting suboxone when pain is better controlled. They will follow up again next week, last note from 1/8 reviewed  HCV -+AB, Genotype 2b, HCV RNA 6.547, hep c quantitative 3,520, 000 -Follow up with ID   Normocytic anemia -Stable, baseline Hgb ~11   DVT prophylaxis: lovenox Code Status: full Family Communication: at bedside sleeping  Disposition Plan: inpatient until completion of IV antibiotics     Consultants:   ID  Cardiology  IMTS for suboxone   Procedures:   TEE 12/21, negative for vegetation   Antimicrobials:  Anti-infectives (From admission, onward)   Start     Dose/Rate Route Frequency Ordered Stop   05/23/17 1700  ceFAZolin (ANCEF) IVPB 2g/100 mL premix     2 g 200 mL/hr over 30 Minutes Intravenous Every 8 hours 05/23/17 1628 07/03/17 2359   05/23/17 0400  vancomycin (VANCOCIN) IVPB 1000 mg/200 mL premix  Status:  Discontinued     1,000 mg 200 mL/hr over 60 Minutes Intravenous Every 8 hours 05/22/17 1733 05/23/17 1455   05/22/17 2000  piperacillin-tazobactam (ZOSYN) IVPB 3.375 g  Status:  Discontinued     3.375 g 12.5 mL/hr over 240 Minutes Intravenous Every 8 hours 05/22/17 1733 05/23/17 1628   05/22/17 1745  vancomycin (VANCOCIN) 500 mg in sodium chloride 0.9 % 100 mL IVPB     500 mg 100 mL/hr over 60 Minutes Intravenous  Once 05/22/17 1733 05/22/17 2042   05/22/17 1115  vancomycin (VANCOCIN) IVPB 1000 mg/200 mL premix     1,000 mg 200 mL/hr over 60 Minutes Intravenous  Once 05/22/17 1100 05/22/17 1220   05/22/17 1115  piperacillin-tazobactam (ZOSYN) IVPB 3.375 g     3.375 g 100 mL/hr over 30 Minutes Intravenous  Once 05/22/17 1100 05/22/17 1744   05/22/17 0000  azithromycin (ZITHROMAX) 250 MG tablet     250 mg Oral Daily 05/22/17 1051         Subjective: -No new issues, continues to have mild low back pain and spasms however ambulating in the  halls.   Objective: Vitals:   06/16/17 1350 06/16/17 2236 06/17/17 0526 06/17/17 0650  BP: 125/68 122/69 97/66   Pulse: (!) 107 100 96   Resp: 16 16 20    Temp: 98.5 F (36.9 C) 99.5 F (37.5 C) 98.6 F (37 C)   TempSrc: Oral Oral Oral   SpO2: 97% 100% 94%   Weight:    75.4 kg (166 lb 4.8 oz)  Height:        Intake/Output Summary (Last 24 hours) at 06/17/2017 1308 Last data filed at 06/16/2017 1800 Gross per 24 hour  Intake 600 ml  Output -  Net 600 ml   Filed Weights   06/15/17 0625 06/16/17  0216 06/17/17 0650  Weight: 74.7 kg (164 lb 10.9 oz) 81.5 kg (179 lb 10.8 oz) 75.4 kg (166 lb 4.8 oz)    Examination:  Gen: Awake, Alert, Oriented X 3,  HEENT: PERRLA, Neck supple, no JVD Lungs: Good air movement bilaterally, CTAB CVS: RRR,No Gallops,Rubs or new Murmurs Abd: soft, Non tender, non distended, BS present Extremities: No Cyanosis, Clubbing or edema Skin: Tattoos throughout his arms and chest, area of induration redness and mild tenderness near his elbow Psychiatry: Judgement and insight appear normal  Data Reviewed: I have personally reviewed following labs and imaging studies  CBC: Recent Labs  Lab 06/11/17 0434  WBC 8.1  HGB 8.8*  HCT 28.6*  MCV 84.9  PLT 355   Basic Metabolic Panel: Recent Labs  Lab 06/11/17 0434  NA 132*  K 4.1  CL 99*  CO2 25  GLUCOSE 113*  BUN 14  CREATININE 0.66  CALCIUM 8.6*   GFR: Estimated Creatinine Clearance: 142.7 mL/min (by C-G formula based on SCr of 0.66 mg/dL). Liver Function Tests: No results for input(s): AST, ALT, ALKPHOS, BILITOT, PROT, ALBUMIN in the last 168 hours. No results for input(s): LIPASE, AMYLASE in the last 168 hours. No results for input(s): AMMONIA in the last 168 hours. Coagulation Profile: No results for input(s): INR, PROTIME in the last 168 hours. Cardiac Enzymes: No results for input(s): CKTOTAL, CKMB, CKMBINDEX, TROPONINI in the last 168 hours. BNP (last 3 results) No results for input(s): PROBNP in the last 8760 hours. HbA1C: No results for input(s): HGBA1C in the last 72 hours. CBG: Recent Labs  Lab 06/11/17 0802 06/12/17 0748 06/13/17 0801 06/14/17 0745 06/15/17 0748  GLUCAP 106* 118* 93 87 111*   Lipid Profile: No results for input(s): CHOL, HDL, LDLCALC, TRIG, CHOLHDL, LDLDIRECT in the last 72 hours. Thyroid Function Tests: No results for input(s): TSH, T4TOTAL, FREET4, T3FREE, THYROIDAB in the last 72 hours. Anemia Panel: No results for input(s): VITAMINB12, FOLATE,  FERRITIN, TIBC, IRON, RETICCTPCT in the last 72 hours. Sepsis Labs: No results for input(s): PROCALCITON, LATICACIDVEN in the last 168 hours.  No results found for this or any previous visit (from the past 240 hour(s)).     Radiology Studies: No results found.    Scheduled Meds: . enoxaparin (LOVENOX) injection  40 mg Subcutaneous Q24H  . sodium chloride flush  3 mL Intravenous Q12H   Continuous Infusions: . sodium chloride 10 mL/hr at 06/03/17 0357  .  ceFAZolin (ANCEF) IV 2 g (06/17/17 0555)     LOS: 26 days    Time spent: 25 minutes   Domenic Polite, MD [facility] [email or web address] Password [email or web address] 06/17/2017, 1:08 PM

## 2017-06-18 LAB — SEDIMENTATION RATE: Sed Rate: 77 mm/hr — ABNORMAL HIGH (ref 0–16)

## 2017-06-18 LAB — CBC
HCT: 30.4 % — ABNORMAL LOW (ref 39.0–52.0)
Hemoglobin: 9.3 g/dL — ABNORMAL LOW (ref 13.0–17.0)
MCH: 25.7 pg — AB (ref 26.0–34.0)
MCHC: 30.6 g/dL (ref 30.0–36.0)
MCV: 84 fL (ref 78.0–100.0)
PLATELETS: 245 10*3/uL (ref 150–400)
RBC: 3.62 MIL/uL — AB (ref 4.22–5.81)
RDW: 14.2 % (ref 11.5–15.5)
WBC: 6 10*3/uL (ref 4.0–10.5)

## 2017-06-18 LAB — BASIC METABOLIC PANEL
Anion gap: 9 (ref 5–15)
BUN: 11 mg/dL (ref 6–20)
CO2: 27 mmol/L (ref 22–32)
CREATININE: 0.69 mg/dL (ref 0.61–1.24)
Calcium: 8.8 mg/dL — ABNORMAL LOW (ref 8.9–10.3)
Chloride: 98 mmol/L — ABNORMAL LOW (ref 101–111)
GFR calc Af Amer: >60 mL/min (ref >60)
Glucose, Bld: 114 mg/dL — ABNORMAL HIGH (ref 65–99)
POTASSIUM: 4.2 mmol/L (ref 3.5–5.1)
SODIUM: 134 mmol/L — AB (ref 135–145)

## 2017-06-18 LAB — C-REACTIVE PROTEIN: CRP: 11.7 mg/dL — AB (ref <1.0)

## 2017-06-18 NOTE — Progress Notes (Addendum)
PROGRESS NOTE    Dorean Daniello [facility]  WUJ:[id number] DOB: 07/31/1985 DOA: 05/22/2017 PCP: Patient, No Pcp Per     Brief Narrative:  Hunter [name] is a 32 yo male with history of IV drug use, prior hand infection and group A streptococcal bacteremia, status post surgery in November discharged on Augmentin which he stopped taking now admitted with MSSA bacteremia and sepsis, lumbar disc infection with psoas abscess. 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 No epidural abscess. -Blood cultures grew MSSA, repeat blood cultures with no growth to date -TEE did not show any evidence of definitive vegetation -CSW following, not appropriate for discharge home with PICC line given IV drug abuse, will either need SNF or stay inpatient to complete antibiotic course -ID consulted and now signed off. See last progress note 12/21 for recommendations. Continue IV ancef for 6 weeks, last day 1/26. Weekly CBC, BMP, CRP, ESR. Call ID back near end of antibiotics.  -Remains stable  Chest wall abscess -Resolved with antibiotics  IVDA  -Drug use 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 No heroin or opiates in a long time.  -Patient planning to eventually move back to [place] to live with his dad -Appreciate Dr. Autumn Patty assistance with possibly starting suboxone when pain is better controlled. They will follow up again next week, last note from 1/8 reviewed  HCV -+AB, Genotype 2b, HCV RNA 6.547, hep c quantitative 3,520, 000 -Follow up with ID   Normocytic anemia -Stable, baseline Hgb ~11   DVT prophylaxis: lovenox Code Status: full Family Communication: at bedside sleeping  Disposition Plan: inpatient until completion of  IV antibiotics    Consultants:   ID  Cardiology  IMTS for suboxone   Procedures:   TEE 12/21, negative for vegetation   Antimicrobials:  Anti-infectives (From admission, onward)   Start     Dose/Rate Route Frequency Ordered Stop   05/23/17 1700  ceFAZolin (ANCEF) IVPB 2g/100 mL premix     2 g 200 mL/hr over 30 Minutes Intravenous Every 8 hours 05/23/17 1628 07/03/17 2359   05/23/17 0400  vancomycin (VANCOCIN) IVPB 1000 mg/200 mL premix  Status:  Discontinued     1,000 mg 200 mL/hr over 60 Minutes Intravenous Every 8 hours 05/22/17 1733 05/23/17 1455   05/22/17 2000  piperacillin-tazobactam (ZOSYN) IVPB 3.375 g  Status:  Discontinued     3.375 g 12.5 mL/hr over 240 Minutes Intravenous Every 8 hours 05/22/17 1733 05/23/17 1628   05/22/17 1745  vancomycin (VANCOCIN) 500 mg in sodium chloride 0.9 % 100 mL IVPB     500 mg 100 mL/hr over 60 Minutes Intravenous  Once 05/22/17 1733 05/22/17 2042   05/22/17 1115  vancomycin (VANCOCIN) IVPB 1000 mg/200 mL premix     1,000 mg 200 mL/hr over 60 Minutes Intravenous  Once 05/22/17 1100 05/22/17 1220   05/22/17 1115  piperacillin-tazobactam (ZOSYN) IVPB 3.375 g     3.375 g 100 mL/hr over 30 Minutes Intravenous  Once 05/22/17 1100 05/22/17 1744   05/22/17 0000  azithromycin (ZITHROMAX) 250 MG tablet     250 mg Oral Daily 05/22/17 1051         Subjective: -Remains stable, mild low back pain which is improving, ambulating without distress  Objective: Vitals:   06/17/17 0650 06/17/17 1407 06/18/17 0032 06/18/17 0553  BP:  119/68 111/63 107/67  Pulse:  (!) 103 92 85  Resp:  18 18 18   Temp:  99.9 F (37.7 C) 99 F (37.2 C) 98.5 F (36.9 C)  TempSrc:  Oral    SpO2:  98% 97% 98%  Weight: 75.4 kg (166 lb 4.8 oz)   74.7 kg (164 lb 11.2 oz)  Height:        Intake/Output Summary (Last 24 hours) at 06/18/2017 1306 Last data filed at 06/18/2017 1201 Gross per 24 hour  Intake 1440.17 ml  Output -  Net 1440.17 ml   Filed Weights    06/16/17 0216 06/17/17 0650 06/18/17 0553  Weight: 81.5 kg (179 lb 10.8 oz) 75.4 kg (166 lb 4.8 oz) 74.7 kg (164 lb 11.2 oz)    Examination:  Gen: Awake, Alert, Oriented X 3,  HEENT: PERRLA, Neck supple, no JVD Lungs: Good air movement bilaterally, CTAB CVS: RRR,No Gallops,Rubs or new Murmurs Abd: soft, Non tender, non distended, BS present Extremities: No Cyanosis, Clubbing or edema Skin: Numerous tattoos, area of redness and induration near the right elbow is improving  Data Reviewed: I have personally reviewed following labs and imaging studies  CBC: Recent Labs  Lab 06/18/17 0545  WBC 6.0  HGB 9.3*  HCT 30.4*  MCV 84.0  PLT 015   Basic Metabolic Panel: Recent Labs  Lab 06/18/17 0545  NA 134*  K 4.2  CL 98*  CO2 27  GLUCOSE 114*  BUN 11  CREATININE 0.69  CALCIUM 8.8*   GFR: Estimated Creatinine Clearance: 141.4 mL/min (by C-G formula based on SCr of 0.69 mg/dL). Liver Function Tests: No results for input(s): AST, ALT, ALKPHOS, BILITOT, PROT, ALBUMIN in the last 168 hours. No results for input(s): LIPASE, AMYLASE in the last 168 hours. No results for input(s): AMMONIA in the last 168 hours. Coagulation Profile: No results for input(s): INR, PROTIME in the last 168 hours. Cardiac Enzymes: No results for input(s): CKTOTAL, CKMB, CKMBINDEX, TROPONINI in the last 168 hours. BNP (last 3 results) No results for input(s): PROBNP in the last 8760 hours. HbA1C: No results for input(s): HGBA1C in the last 72 hours. CBG: Recent Labs  Lab 06/12/17 0748 06/13/17 0801 06/14/17 0745 06/15/17 0748  GLUCAP 118* 93 87 111*   Lipid Profile: No results for input(s): CHOL, HDL, LDLCALC, TRIG, CHOLHDL, LDLDIRECT in the last 72 hours. Thyroid Function Tests: No results for input(s): TSH, T4TOTAL, FREET4, T3FREE, THYROIDAB in the last 72 hours. Anemia Panel: No results for input(s): VITAMINB12, FOLATE, FERRITIN, TIBC, IRON, RETICCTPCT in the last 72 hours. Sepsis  Labs: No results for input(s): PROCALCITON, LATICACIDVEN in the last 168 hours.  No results found for this or any previous visit (from the past 240 hour(s)).     Radiology Studies: No results found.    Scheduled Meds: . enoxaparin (LOVENOX) injection  40 mg Subcutaneous Q24H  . sodium chloride flush  3 mL Intravenous Q12H   Continuous Infusions: . sodium chloride 10 mL/hr at 06/03/17 0357  .  ceFAZolin (ANCEF) IV Stopped (06/18/17 1155)     LOS: 27 days    Time spent: 10 minutes   Domenic Polite, MD [facility] [email or web address] Password [email or web address] 06/18/2017, 1:06 PM

## 2017-06-19 NOTE — Progress Notes (Signed)
No changes, continue IV Ancef and current regimen  Zannie CovePreetha Juanito Gonyer, MD

## 2017-06-20 NOTE — Progress Notes (Signed)
No changes, remains in good spirits, continue IV Ancef and current medication regimen  Zannie CovePreetha Kelli Egolf, MD

## 2017-06-21 NOTE — Progress Notes (Signed)
PROGRESS NOTE    Hunter [name]  UJW:[id number] DOB: 1985-11-13 DOA: 05/22/2017 PCP: Patient, No Pcp Per     Brief Narrative:  Hunter [name] is a 32 yo male with history of IV drug use, prior hand infection and group A streptococcal bacteremia, status post surgery in November discharged on Augmentin which he stopped taking now admitted with MSSA bacteremia and sepsis, lumbar disc infection with psoas abscess. 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 No epidural abscess. -Blood cultures grew MSSA, repeat blood cultures with no growth to date -TEE did not show any evidence of definitive vegetation -CSW following, not appropriate for discharge home with PICC line given IV drug abuse, will either need SNF or stay inpatient to complete antibiotic course -ID consulted and now signed off. See last progress note 12/21 for recommendations. Continue IV ancef for 6 weeks, last day 1/26. Weekly CBC, BMP, CRP, ESR. Call ID back near end of antibiotics.  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 No heroin or opiates in a long time.  -Patient planning to eventually move back to [place] to live with his dad -Appreciate Dr. Autumn Patty assistance with possibly starting suboxone when pain is better controlled. They will follow up again next week, last note from 1/8 reviewed  HCV -+AB, Genotype 2b, HCV RNA 6.547, hep c quantitative 3,520, 000 -Follow up with ID   Normocytic anemia -Stable, baseline Hgb ~11   DVT prophylaxis: lovenox Code Status: full Family Communication: none at bedside Disposition Plan: inpatient until  completion of IV antibiotics    Consultants:   ID  Cardiology  IMTS for suboxone   Procedures:   TEE 12/21, negative for vegetation   Antimicrobials:  Anti-infectives (From admission, onward)   Start     Dose/Rate Route Frequency Ordered Stop   05/23/17 1700  ceFAZolin (ANCEF) IVPB 2g/100 mL premix     2 g 200 mL/hr over 30 Minutes Intravenous Every 8 hours 05/23/17 1628 07/03/17 2359   05/23/17 0400  vancomycin (VANCOCIN) IVPB 1000 mg/200 mL premix  Status:  Discontinued     1,000 mg 200 mL/hr over 60 Minutes Intravenous Every 8 hours 05/22/17 1733 05/23/17 1455   05/22/17 2000  piperacillin-tazobactam (ZOSYN) IVPB 3.375 g  Status:  Discontinued     3.375 g 12.5 mL/hr over 240 Minutes Intravenous Every 8 hours 05/22/17 1733 05/23/17 1628   05/22/17 1745  vancomycin (VANCOCIN) 500 mg in sodium chloride 0.9 % 100 mL IVPB     500 mg 100 mL/hr over 60 Minutes Intravenous  Once 05/22/17 1733 05/22/17 2042   05/22/17 1115  vancomycin (VANCOCIN) IVPB 1000 mg/200 mL premix     1,000 mg 200 mL/hr over 60 Minutes Intravenous  Once 05/22/17 1100 05/22/17 1220   05/22/17 1115  piperacillin-tazobactam (ZOSYN) IVPB 3.375 g     3.375 g 100 mL/hr over 30 Minutes Intravenous  Once 05/22/17 1100 05/22/17 1744   05/22/17 0000  azithromycin (ZITHROMAX) 250 MG tablet     250 mg Oral Daily 05/22/17 1051         Subjective: -feels ok, in good spirits, plan to move to [facility] to be with his dad  Objective: Vitals:   06/20/17 0519 06/20/17 1500 06/20/17 2156 06/21/17 0556  BP: 119/67 (!) 101/59 (!) 98/51 135/85  Pulse: 98 91 96 (!) 101  Resp: 18 20 18 18   Temp: 97.8 F (36.6 C) 98.3 F (36.8 C) 98 F (36.7 C) 98.4 F (36.9 C)  TempSrc: Oral Oral Oral Oral  SpO2: 99% 96% 100% 97%  Weight: 75.3 kg (166 lb 1.6 oz)   75.3 kg (166 lb)  Height:        Intake/Output Summary (Last 24 hours) at 06/21/2017 1542 Last data filed at 06/21/2017 0529 Gross per 24 hour  Intake 245.33 ml  Output  -  Net 245.33 ml   Filed Weights   06/19/17 0500 06/20/17 0519 06/21/17 0556  Weight: 74.8 kg (165 lb) 75.3 kg (166 lb 1.6 oz) 75.3 kg (166 lb)    Examination:  Gen: Awake, Alert, Oriented X 3,  HEENT: PERRLA, Neck supple, no JVD Lungs: Good air movement bilaterally, CTAB CVS: RRR,No Gallops,Rubs or new Murmurs Abd: soft, Non tender, non distended, BS present Extremities: No Cyanosis, Clubbing or edema Skin: multiple tattoos, inflammation near elbow has resolved   Data Reviewed: I have personally reviewed following labs and imaging studies  CBC: Recent Labs  Lab 06/18/17 0545  WBC 6.0  HGB 9.3*  HCT 30.4*  MCV 84.0  PLT 099   Basic Metabolic Panel: Recent Labs  Lab 06/18/17 0545  NA 134*  K 4.2  CL 98*  CO2 27  GLUCOSE 114*  BUN 11  CREATININE 0.69  CALCIUM 8.8*   GFR: Estimated Creatinine Clearance: 142.5 mL/min (by C-G formula based on SCr of 0.69 mg/dL). Liver Function Tests: No results for input(s): AST, ALT, ALKPHOS, BILITOT, PROT, ALBUMIN in the last 168 hours. No results for input(s): LIPASE, AMYLASE in the last 168 hours. No results for input(s): AMMONIA in the last 168 hours. Coagulation Profile: No results for input(s): INR, PROTIME in the last 168 hours. Cardiac Enzymes: No results for input(s): CKTOTAL, CKMB, CKMBINDEX, TROPONINI in the last 168 hours. BNP (last 3 results) No results for input(s): PROBNP in the last 8760 hours. HbA1C: No results for input(s): HGBA1C in the last 72 hours. CBG: Recent Labs  Lab 06/15/17 0748  GLUCAP 111*   Lipid Profile: No results for input(s): CHOL, HDL, LDLCALC, TRIG, CHOLHDL, LDLDIRECT in the last 72 hours. Thyroid Function Tests: No results for input(s): TSH, T4TOTAL, FREET4, T3FREE, THYROIDAB in the last 72 hours. Anemia Panel: No results for input(s): VITAMINB12, FOLATE, FERRITIN, TIBC, IRON, RETICCTPCT in the last 72 hours. Sepsis Labs: No results for input(s): PROCALCITON, LATICACIDVEN in the  last 168 hours.  No results found for this or any previous visit (from the past 240 hour(s)).     Radiology Studies: No results found.    Scheduled Meds: . enoxaparin (LOVENOX) injection  40 mg Subcutaneous Q24H  . sodium chloride flush  3 mL Intravenous Q12H   Continuous Infusions: . sodium chloride 10 mL/hr at 06/03/17 0357  .  ceFAZolin (ANCEF) IV 2 g (06/21/17 1453)     LOS: 30 days    Time spent: 15 minutes   Domenic Polite, MD [facility] [email or web address] Password [place] 06/21/2017, 3:42 PM

## 2017-06-22 NOTE — Progress Notes (Addendum)
Pt seen and examined Clinically improving Continue IV Ancef, no changes from 1/14 note  Zannie CovePreetha Amogh Komatsu, MD

## 2017-06-22 NOTE — Progress Notes (Addendum)
    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 He has been admitted since 12/15 for vertebral osteomyelitis due to MSSA which is a complication of injection drug use. He had severe acute pain due to a psoas abscess and intense surrounding myositis which was managed medically. He has been using short acting opioids, oxycodone, to manage this pain as well as stave off withdrawal symptoms. His pain generator is now much improved, he reports that it is manageable, he is walking, eating, drinking, and near normal functional status. He wants to transition to OUD treatment with Buprenorphine. He has plans to continue [facility] treatment in [place] while living with his father, they have compiled a list of clinics that offer MAT which they can afford.   We made a plan that his last dose of oxycodone will be around 8pm tonight. He will need to go 12 hours without any short acting opioids prior to starting Buprenorphine. This may result in moderate withdrawal symptoms, which he understands. In the morning, we can start Bupe/Naloxone 8mg  SL once. I will place this order in the morning if he is able to abstain from oxycodone as planned. I will re-evaluate him tomorrow and move to maintenance dosing 8mg  BID if he does well.

## 2017-06-23 MED ORDER — BUPRENORPHINE HCL-NALOXONE HCL 8-2 MG SL SUBL
1.0000 | SUBLINGUAL_TABLET | Freq: Two times a day (BID) | SUBLINGUAL | Status: DC
  Administered 2017-06-23 – 2017-07-03 (×20): 1 via SUBLINGUAL
  Filled 2017-06-23 (×20): qty 1

## 2017-06-23 MED ORDER — BUPRENORPHINE HCL-NALOXONE HCL 8-2 MG SL SUBL
1.0000 | SUBLINGUAL_TABLET | Freq: Every day | SUBLINGUAL | Status: DC
  Administered 2017-06-23: 1 via SUBLINGUAL
  Filled 2017-06-23: qty 1

## 2017-06-23 NOTE — Progress Notes (Addendum)
PROGRESS NOTE    Hunter [name] [facility]  BDZ:[id number] DOB: 10-13-1985 DOA: 05/22/2017 PCP: Patient, No Pcp Per     Brief Narrative:  Hunter [name] is a 32 yo male with history of IV drug use, prior hand infection and group A streptococcal bacteremia, status post surgery in November discharged on Augmentin which he stopped taking now admitted with MSSA bacteremia and sepsis, lumbar disc infection with psoas abscess. 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 No epidural abscess. -Blood cultures grew MSSA, repeat blood cultures with no growth to date -TEE did not show any evidence of definitive vegetation -CSW following, not appropriate for discharge home with PICC line given IV drug abuse, will stay inpatient to complete antibiotic course -ID consulted and now signed off. See last progress note 12/21 for recommendations. Continue IV ancef for 6 weeks, last day 1/26. Weekly CBC, BMP, CRP, ESR. Call ID back near end of antibiotics.  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 No heroin or opiates in a long time.  -Patient planning to eventually move back to [place] to live with his dad -Appreciate Dr. Autumn Patty assistance plan to start suboxone likely today, we was provided a list of clinics in [place] that offer MAT that is affordable   HCV -+AB, Genotype 2b, HCV RNA 6.547, hep c quantitative 3,520, 000 -Follow up with ID   Normocytic anemia -Stable, baseline Hgb ~11   DVT prophylaxis: lovenox Code  Status: full Family Communication: none at bedside Disposition Plan: inpatient until completion of IV antibiotics    Consultants:   ID  Cardiology  IMTS for suboxone   Procedures:   TEE 12/21, negative for vegetation   Antimicrobials:  Anti-infectives (From admission, onward)   Start     Dose/Rate Route Frequency Ordered Stop   05/23/17 1700  ceFAZolin (ANCEF) IVPB 2g/100 mL premix     2 g 200 mL/hr over 30 Minutes Intravenous Every 8 hours 05/23/17 1628 07/03/17 2359   05/23/17 0400  vancomycin (VANCOCIN) IVPB 1000 mg/200 mL premix  Status:  Discontinued     1,000 mg 200 mL/hr over 60 Minutes Intravenous Every 8 hours 05/22/17 1733 05/23/17 1455   05/22/17 2000  piperacillin-tazobactam (ZOSYN) IVPB 3.375 g  Status:  Discontinued     3.375 g 12.5 mL/hr over 240 Minutes Intravenous Every 8 hours 05/22/17 1733 05/23/17 1628   05/22/17 1745  vancomycin (VANCOCIN) 500 mg in sodium chloride 0.9 % 100 mL IVPB     500 mg 100 mL/hr over 60 Minutes Intravenous  Once 05/22/17 1733 05/22/17 2042   05/22/17 1115  vancomycin (VANCOCIN) IVPB 1000 mg/200 mL premix     1,000 mg 200 mL/hr over 60 Minutes Intravenous  Once 05/22/17 1100 05/22/17 1220   05/22/17 1115  piperacillin-tazobactam (ZOSYN) IVPB 3.375 g     3.375 g 100 mL/hr over 30 Minutes Intravenous  Once 05/22/17 1100 05/22/17 1744   05/22/17 0000  azithromycin (ZITHROMAX) 250 MG tablet     250 mg Oral Daily 05/22/17 1051  Subjective: -feels ok, waiting to start Suboxone per Dr.VIncent   Objective: Vitals:   06/22/17 0505 06/22/17 1459 06/22/17 2127 06/23/17 0705  BP: 127/71 (!) 102/55 110/66 115/72  Pulse: 87 89 88 84  Resp: 18 18 18 20   Temp: 97.8 F (36.6 C) (!) 97.5 F (36.4 C) 97.6 F (36.4 C) 98 F (36.7 C)  TempSrc: Oral Oral Oral Oral  SpO2: 98% 97% 100% 98%  Weight: 73.6 kg (162 lb 3.2 oz)     Height:        Intake/Output Summary (Last 24 hours) at 06/23/2017 1138 Last data filed at 06/23/2017  1007 Gross per 24 hour  Intake 826 ml  Output -  Net 826 ml   Filed Weights   06/20/17 0519 06/21/17 0556 06/22/17 0505  Weight: 75.3 kg (166 lb 1.6 oz) 75.3 kg (166 lb) 73.6 kg (162 lb 3.2 oz)    Examination:  Gen: Awake, Alert, Oriented X 3,  HEENT: PERRLA, Neck supple, no JVD Lungs: Good air movement bilaterally, CTAB CVS: RRR,no murmurs Abd: soft, Non tender, non distended, BS present Extremities: No Cyanosis, Clubbing or edema Skin: numerous tattoos, areas of swelling and inflammation near elbow much improved    Data Reviewed: I have personally reviewed following labs and imaging studies  CBC: Recent Labs  Lab 06/18/17 0545  WBC 6.0  HGB 9.3*  HCT 30.4*  MCV 84.0  PLT 060   Basic Metabolic Panel: Recent Labs  Lab 06/18/17 0545  NA 134*  K 4.2  CL 98*  CO2 27  GLUCOSE 114*  BUN 11  CREATININE 0.69  CALCIUM 8.8*   GFR: Estimated Creatinine Clearance: 139.3 mL/min (by C-G formula based on SCr of 0.69 mg/dL). Liver Function Tests: No results for input(s): AST, ALT, ALKPHOS, BILITOT, PROT, ALBUMIN in the last 168 hours. No results for input(s): LIPASE, AMYLASE in the last 168 hours. No results for input(s): AMMONIA in the last 168 hours. Coagulation Profile: No results for input(s): INR, PROTIME in the last 168 hours. Cardiac Enzymes: No results for input(s): CKTOTAL, CKMB, CKMBINDEX, TROPONINI in the last 168 hours. BNP (last 3 results) No results for input(s): PROBNP in the last 8760 hours. HbA1C: No results for input(s): HGBA1C in the last 72 hours. CBG: No results for input(s): GLUCAP in the last 168 hours. Lipid Profile: No results for input(s): CHOL, HDL, LDLCALC, TRIG, CHOLHDL, LDLDIRECT in the last 72 hours. Thyroid Function Tests: No results for input(s): TSH, T4TOTAL, FREET4, T3FREE, THYROIDAB in the last 72 hours. Anemia Panel: No results for input(s): VITAMINB12, FOLATE, FERRITIN, TIBC, IRON, RETICCTPCT in the last 72 hours. Sepsis  Labs: No results for input(s): PROCALCITON, LATICACIDVEN in the last 168 hours.  No results found for this or any previous visit (from the past 240 hour(s)).     Radiology Studies: No results found.    Scheduled Meds: . buprenorphine-naloxone  1 tablet Sublingual Daily  . enoxaparin (LOVENOX) injection  40 mg Subcutaneous Q24H  . sodium chloride flush  3 mL Intravenous Q12H   Continuous Infusions: . sodium chloride 10 mL/hr at 06/23/17 1005  .  ceFAZolin (ANCEF) IV 2 g (06/23/17 0610)     LOS: 32 days    Time spent: 15 minutes   Domenic Polite, MD [facility] [email or web address] Password [facility] 06/23/2017, 11:38 AM

## 2017-06-23 NOTE — Progress Notes (Addendum)
   Hunter [name] did well with starting Bupe/Naloxone this morning. His last dose of oxycodone was midnight, this morning he had mild withdrawal symptoms with sweating and anxiousness. Says it was a little worse than he was expecting. He started Bupe 8mg  at 10am and had resolution of symptoms. He is currently feeling normal, no sedation. Plan is to redose tonight at 10pm and then do 8mg  BID for the remainder of the hospitalization. At discharge, I will give him a prescription for a one week supply so he can make the transition to outpatient MAT in [place]. Will follow up with him in a few days. Please call me if there are any questions.   Hunter Honguncan Vincent MD Pager: [phone]

## 2017-06-24 NOTE — Progress Notes (Addendum)
PROGRESS NOTE    Hunter [name]  TDS:[id number] DOB: 1986/01/02 DOA: 05/22/2017 PCP: Patient, No Pcp Per     Brief Narrative:  Hunter [name] is a 32 yo male with history of IV drug use, prior hand infection and group A streptococcal bacteremia, status post surgery in November discharged on Augmentin which he stopped taking now admitted with MSSA bacteremia and sepsis, lumbar disc infection with psoas abscess. He is currently being treated with 6 week course of IV ancef until 1/26 IM Dr.Vincent following, plan to start Suboxone this admission and then FU with Clinic in [place] that provide affordable MAT  06/24/2017 - Patient seen today. No new complaints.  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 No epidural abscess. -Blood cultures grew MSSA, repeat blood cultures with no growth to date -TEE did not show any evidence of definitive vegetation -CSW following, not appropriate for discharge home with PICC line given IV drug abuse, will stay inpatient to complete antibiotic course -ID consulted and now signed off. See last progress note 12/21 for recommendations. Continue IV ancef for 6 weeks, last day 1/26. Weekly CBC, BMP, CRP, ESR. Call ID back near end of antibiotics.  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 No heroin or opiates in a long time.  -Patient planning to eventually move back to [place] to live with his dad -Appreciate Dr. Autumn Patty assistance plan to start suboxone likely today, we was provided a list of clinics in [place] that offer MAT that is affordable   HCV -+AB, Genotype 2b, HCV RNA 6.547, hep c quantitative 3,520, 000 -Follow up with ID   Normocytic anemia -Stable,  baseline Hgb ~11   DVT prophylaxis: lovenox Code Status: full Family Communication: none at bedside Disposition Plan: inpatient until completion of IV antibiotics    Consultants:   ID  Cardiology  IMTS for suboxone   Procedures:   TEE 12/21, negative for vegetation   Antimicrobials:  Anti-infectives (From admission, onward)   Start     Dose/Rate Route Frequency Ordered Stop   05/23/17 1700  ceFAZolin (ANCEF) IVPB 2g/100 mL premix     2 g 200 mL/hr over 30 Minutes Intravenous Every 8 hours 05/23/17 1628 07/03/17 2359   05/23/17 0400  vancomycin (VANCOCIN) IVPB 1000 mg/200 mL premix  Status:  Discontinued     1,000 mg 200 mL/hr over 60 Minutes Intravenous Every 8 hours 05/22/17 1733 05/23/17 1455   05/22/17 2000  piperacillin-tazobactam (ZOSYN) IVPB 3.375 g  Status:  Discontinued     3.375 g 12.5 mL/hr over 240 Minutes Intravenous Every 8 hours 05/22/17 1733 05/23/17 1628   05/22/17 1745  vancomycin (VANCOCIN) 500 mg in sodium chloride 0.9 % 100 mL IVPB     500 mg 100 mL/hr over 60 Minutes Intravenous  Once 05/22/17 1733 05/22/17 2042   05/22/17 1115  vancomycin (VANCOCIN) IVPB 1000 mg/200 mL premix     1,000 mg 200 mL/hr over 60 Minutes Intravenous  Once 05/22/17 1100 05/22/17 1220   05/22/17 1115  piperacillin-tazobactam (ZOSYN) IVPB 3.375 g     3.375 g 100 mL/hr over 30 Minutes Intravenous  Once 05/22/17 1100 05/22/17 1744   05/22/17 0000  azithromycin (ZITHROMAX) 250 MG tablet     250 mg  Oral Daily 05/22/17 1051         Subjective: -feels ok, waiting to start Suboxone per Dr.VIncent   Objective: Vitals:   06/23/17 1436 06/23/17 2224 06/24/17 0602 06/24/17 1458  BP: 115/72 105/64 111/70 95/65  Pulse: 92 92 86 82  Resp: 18 18 18 18   Temp: 98 F (36.7 C) 98 F (36.7 C) 98.2 F (36.8 C) 98.3 F (36.8 C)  TempSrc: Oral Oral Oral Oral  SpO2: 100% 100% 99% 99%  Weight:   76.1 kg (167 lb 11.2 oz)   Height:        Intake/Output Summary (Last 24 hours) at  06/24/2017 1814 Last data filed at 06/24/2017 0303 Gross per 24 hour  Intake 887.17 ml  Output -  Net 887.17 ml   Filed Weights   06/21/17 0556 06/22/17 0505 06/24/17 0602  Weight: 75.3 kg (166 lb) 73.6 kg (162 lb 3.2 oz) 76.1 kg (167 lb 11.2 oz)    Examination:  Gen: Awake, Alert, Oriented X 3,  HEENT: PERRLA, Neck supple, no JVD Lungs: Good air movement bilaterally, CTAB CVS: RRR,no murmurs Abd: soft, Non tender, non distended, BS present Extremities: No Cyanosis, Clubbing or edema Skin: numerous tattoos, areas of swelling and inflammation near elbow much improved    Data Reviewed: I have personally reviewed following labs and imaging studies  CBC: Recent Labs  Lab 06/18/17 0545  WBC 6.0  HGB 9.3*  HCT 30.4*  MCV 84.0  PLT 102   Basic Metabolic Panel: Recent Labs  Lab 06/18/17 0545  NA 134*  K 4.2  CL 98*  CO2 27  GLUCOSE 114*  BUN 11  CREATININE 0.69  CALCIUM 8.8*   GFR: Estimated Creatinine Clearance: 144 mL/min (by C-G formula based on SCr of 0.69 mg/dL). Liver Function Tests: No results for input(s): AST, ALT, ALKPHOS, BILITOT, PROT, ALBUMIN in the last 168 hours. No results for input(s): LIPASE, AMYLASE in the last 168 hours. No results for input(s): AMMONIA in the last 168 hours. Coagulation Profile: No results for input(s): INR, PROTIME in the last 168 hours. Cardiac Enzymes: No results for input(s): CKTOTAL, CKMB, CKMBINDEX, TROPONINI in the last 168 hours. BNP (last 3 results) No results for input(s): PROBNP in the last 8760 hours. HbA1C: No results for input(s): HGBA1C in the last 72 hours. CBG: No results for input(s): GLUCAP in the last 168 hours. Lipid Profile: No results for input(s): CHOL, HDL, LDLCALC, TRIG, CHOLHDL, LDLDIRECT in the last 72 hours. Thyroid Function Tests: No results for input(s): TSH, T4TOTAL, FREET4, T3FREE, THYROIDAB in the last 72 hours. Anemia Panel: No results for input(s): VITAMINB12, FOLATE, FERRITIN, TIBC,  IRON, RETICCTPCT in the last 72 hours. Sepsis Labs: No results for input(s): PROCALCITON, LATICACIDVEN in the last 168 hours.  No results found for this or any previous visit (from the past 240 hour(s)).     Radiology Studies: No results found.    Scheduled Meds: . buprenorphine-naloxone  1 tablet Sublingual BID  . enoxaparin (LOVENOX) injection  40 mg Subcutaneous Q24H  . sodium chloride flush  3 mL Intravenous Q12H   Continuous Infusions: . sodium chloride 10 mL/hr at 06/23/17 1005  .  ceFAZolin (ANCEF) IV Stopped (06/24/17 1354)     LOS: 33 days    Time spent: 15 minutes   Bonnell Public, MD [facility] [email or web address] Password [facility] 06/24/2017, 6:14 PM

## 2017-06-25 LAB — BASIC METABOLIC PANEL
Anion gap: 11 (ref 5–15)
BUN: 12 mg/dL (ref 6–20)
CALCIUM: 9.1 mg/dL (ref 8.9–10.3)
CO2: 25 mmol/L (ref 22–32)
CREATININE: 0.65 mg/dL (ref 0.61–1.24)
Chloride: 102 mmol/L (ref 101–111)
GFR calc Af Amer: >60 mL/min (ref >60)
GLUCOSE: 103 mg/dL — AB (ref 65–99)
POTASSIUM: 4 mmol/L (ref 3.5–5.1)
SODIUM: 138 mmol/L (ref 135–145)

## 2017-06-25 LAB — C-REACTIVE PROTEIN: CRP: 1.9 mg/dL — ABNORMAL HIGH (ref <1.0)

## 2017-06-25 LAB — CBC
HCT: 34.5 % — ABNORMAL LOW (ref 39.0–52.0)
Hemoglobin: 10.7 g/dL — ABNORMAL LOW (ref 13.0–17.0)
MCH: 26.3 pg (ref 26.0–34.0)
MCHC: 31 g/dL (ref 30.0–36.0)
MCV: 84.8 fL (ref 78.0–100.0)
PLATELETS: 300 10*3/uL (ref 150–400)
RBC: 4.07 MIL/uL — AB (ref 4.22–5.81)
RDW: 14.3 % (ref 11.5–15.5)
WBC: 9.7 10*3/uL (ref 4.0–10.5)

## 2017-06-25 LAB — SEDIMENTATION RATE: Sed Rate: 51 mm/hr — ABNORMAL HIGH (ref 0–16)

## 2017-06-25 NOTE — Progress Notes (Addendum)
PROGRESS NOTE    Hunter [name] [facility]  EHU:[id number] DOB: 1985-10-28 DOA: 05/22/2017 PCP: Patient, No Pcp Per     Brief Narrative:  Hunter [name] is a 32 yo male with history of IV drug use, prior hand infection and group A streptococcal bacteremia, status post surgery in November discharged on Augmentin which he stopped taking now admitted with MSSA bacteremia and sepsis, lumbar disc infection with psoas abscess. He is currently being treated with 6 week course of IV ancef until 1/26 IM Dr.Vincent following, plan to start Suboxone this admission and then FU with Clinic in [place] that provide affordable MAT  06/25/2017 - No new complaints.  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 No epidural abscess. -Blood cultures grew MSSA, repeat blood cultures with no growth to date -TEE did not show any evidence of definitive vegetation -CSW following, not appropriate for discharge home with PICC line given IV drug abuse, will stay inpatient to complete antibiotic course -ID consulted and now signed off. See last progress note 12/21 for recommendations. Continue IV ancef for 6 weeks, last day 1/26. Weekly CBC, BMP, CRP, ESR. Call ID back near end of antibiotics.  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 No heroin or opiates in a long time.  -Patient planning to eventually move back to [place] to live with his dad -Appreciate Dr. Autumn Patty assistance plan to start suboxone likely today, we was provided a list of clinics in [place] that offer MAT that is affordable   HCV -+AB, Genotype 2b, HCV RNA 6.547, hep c quantitative 3,520, 000 -Follow up with ID   Normocytic anemia -Stable, baseline Hgb  ~11   DVT prophylaxis: lovenox Code Status: full Family Communication: none at bedside Disposition Plan: inpatient until completion of IV antibiotics    Consultants:   ID  Cardiology  IMTS for suboxone   Procedures:   TEE 12/21, negative for vegetation   Antimicrobials:  Anti-infectives (From admission, onward)   Start     Dose/Rate Route Frequency Ordered Stop   05/23/17 1700  ceFAZolin (ANCEF) IVPB 2g/100 mL premix     2 g 200 mL/hr over 30 Minutes Intravenous Every 8 hours 05/23/17 1628 07/03/17 2359   05/23/17 0400  vancomycin (VANCOCIN) IVPB 1000 mg/200 mL premix  Status:  Discontinued     1,000 mg 200 mL/hr over 60 Minutes Intravenous Every 8 hours 05/22/17 1733 05/23/17 1455   05/22/17 2000  piperacillin-tazobactam (ZOSYN) IVPB 3.375 g  Status:  Discontinued     3.375 g 12.5 mL/hr over 240 Minutes Intravenous Every 8 hours 05/22/17 1733 05/23/17 1628   05/22/17 1745  vancomycin (VANCOCIN) 500 mg in sodium chloride 0.9 % 100 mL IVPB     500 mg 100 mL/hr over 60 Minutes Intravenous  Once 05/22/17 1733 05/22/17 2042   05/22/17 1115  vancomycin (VANCOCIN) IVPB 1000 mg/200 mL premix     1,000 mg 200 mL/hr over 60 Minutes Intravenous  Once 05/22/17 1100 05/22/17 1220   05/22/17 1115  piperacillin-tazobactam (ZOSYN) IVPB 3.375 g     3.375 g 100 mL/hr over 30 Minutes Intravenous  Once 05/22/17 1100 05/22/17 1744   05/22/17 0000  azithromycin (ZITHROMAX) 250 MG tablet     250 mg Oral Daily 05/22/17  1051         Subjective: -feels ok, waiting to start Suboxone per Dr.VIncent   Objective: Vitals:   06/24/17 1458 06/24/17 2138 06/25/17 0601 06/25/17 1317  BP: 95/65 116/78 119/75 115/72  Pulse: 82 (!) 105 98 96  Resp: 18 18 18 17   Temp: 98.3 F (36.8 C) 98.1 F (36.7 C) 98.3 F (36.8 C) 98 F (36.7 C)  TempSrc: Oral  Oral Oral  SpO2: 99% 98% 99% 100%  Weight:   76.6 kg (168 lb 12.8 oz)   Height:        Intake/Output Summary (Last 24 hours) at 06/25/2017  1835 Last data filed at 06/25/2017 1515 Gross per 24 hour  Intake 1832 ml  Output -  Net 1832 ml   Filed Weights   06/22/17 0505 06/24/17 0602 06/25/17 0601  Weight: 73.6 kg (162 lb 3.2 oz) 76.1 kg (167 lb 11.2 oz) 76.6 kg (168 lb 12.8 oz)    Examination:  Gen: Awake, Alert, Oriented X 3,  HEENT: PERRLA, Neck supple, no JVD Lungs: Good air movement bilaterally, CTAB CVS: RRR,no murmurs Abd: soft, Non tender, non distended, BS present Extremities: No Cyanosis, Clubbing or edema Skin: numerous tattoos, areas of swelling and inflammation near elbow much improved    Data Reviewed: I have personally reviewed following labs and imaging studies  CBC: Recent Labs  Lab 06/25/17 0459  WBC 9.7  HGB 10.7*  HCT 34.5*  MCV 84.8  PLT 932   Basic Metabolic Panel: Recent Labs  Lab 06/25/17 0459  NA 138  K 4.0  CL 102  CO2 25  GLUCOSE 103*  BUN 12  CREATININE 0.65  CALCIUM 9.1   GFR: Estimated Creatinine Clearance: 145 mL/min (by C-G formula based on SCr of 0.65 mg/dL). Liver Function Tests: No results for input(s): AST, ALT, ALKPHOS, BILITOT, PROT, ALBUMIN in the last 168 hours. No results for input(s): LIPASE, AMYLASE in the last 168 hours. No results for input(s): AMMONIA in the last 168 hours. Coagulation Profile: No results for input(s): INR, PROTIME in the last 168 hours. Cardiac Enzymes: No results for input(s): CKTOTAL, CKMB, CKMBINDEX, TROPONINI in the last 168 hours. BNP (last 3 results) No results for input(s): PROBNP in the last 8760 hours. HbA1C: No results for input(s): HGBA1C in the last 72 hours. CBG: No results for input(s): GLUCAP in the last 168 hours. Lipid Profile: No results for input(s): CHOL, HDL, LDLCALC, TRIG, CHOLHDL, LDLDIRECT in the last 72 hours. Thyroid Function Tests: No results for input(s): TSH, T4TOTAL, FREET4, T3FREE, THYROIDAB in the last 72 hours. Anemia Panel: No results for input(s): VITAMINB12, FOLATE, FERRITIN, TIBC, IRON,  RETICCTPCT in the last 72 hours. Sepsis Labs: No results for input(s): PROCALCITON, LATICACIDVEN in the last 168 hours.  No results found for this or any previous visit (from the past 240 hour(s)).     Radiology Studies: No results found.    Scheduled Meds: . buprenorphine-naloxone  1 tablet Sublingual BID  . enoxaparin (LOVENOX) injection  40 mg Subcutaneous Q24H  . sodium chloride flush  3 mL Intravenous Q12H   Continuous Infusions: . sodium chloride 10 mL/hr at 06/23/17 1005  .  ceFAZolin (ANCEF) IV Stopped (06/25/17 1505)     LOS: 34 days    Time spent: 15 minutes   Bonnell Public, MD [facility] [email or web address] Password [facility] 06/25/2017, 6:35 PM

## 2017-06-26 NOTE — Progress Notes (Addendum)
PROGRESS NOTE    Hunter [name] [facility]  ZSM:[id number] DOB: Jan 20, 1986 DOA: 05/22/2017 PCP: Patient, No Pcp Per     Brief Narrative:  Cejay Cambre is a 32 yo male with history of IV drug use, prior hand infection and group A streptococcal bacteremia, status post surgery in November discharged on Augmentin which he stopped taking now admitted with MSSA bacteremia and sepsis, lumbar disc infection with psoas abscess. He is currently being treated with 6 week course of IV ancef until 1/26 IM Dr.Vincent following, plan to start Suboxone this admission and then FU with Clinic in [place] that provide affordable MAT  06/26/2017 - No new complaints. Watching TV in the room.  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 No epidural abscess. -Blood cultures grew MSSA, repeat blood cultures with no growth to date -TEE did not show any evidence of definitive vegetation -CSW following, not appropriate for discharge home with PICC line given IV drug abuse, will stay inpatient to complete antibiotic course -ID consulted and now signed off. See last progress note 12/21 for recommendations. Continue IV ancef for 6 weeks, last day 1/26. Weekly CBC, BMP, CRP, ESR. Call ID back near end of antibiotics.  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 No heroin or opiates in a long time.  -Patient planning to eventually move back to [place] to live with his dad -Appreciate Dr. Autumn Patty assistance plan to start suboxone likely today, we was provided a list of clinics in [place] that offer MAT that is affordable   HCV -+AB, Genotype 2b, HCV RNA 6.547, hep c quantitative 3,520, 000 -Follow up with ID   Normocytic anemia -Stable,  baseline Hgb ~11   DVT prophylaxis: lovenox Code Status: full Family Communication: none at bedside Disposition Plan: inpatient until completion of IV antibiotics    Consultants:   ID  Cardiology  IMTS for suboxone   Procedures:   TEE 12/21, negative for vegetation   Antimicrobials:  Anti-infectives (From admission, onward)   Start     Dose/Rate Route Frequency Ordered Stop   05/23/17 1700  ceFAZolin (ANCEF) IVPB 2g/100 mL premix     2 g 200 mL/hr over 30 Minutes Intravenous Every 8 hours 05/23/17 1628 07/03/17 2359   05/23/17 0400  vancomycin (VANCOCIN) IVPB 1000 mg/200 mL premix  Status:  Discontinued     1,000 mg 200 mL/hr over 60 Minutes Intravenous Every 8 hours 05/22/17 1733 05/23/17 1455   05/22/17 2000  piperacillin-tazobactam (ZOSYN) IVPB 3.375 g  Status:  Discontinued     3.375 g 12.5 mL/hr over 240 Minutes Intravenous Every 8 hours 05/22/17 1733 05/23/17 1628   05/22/17 1745  vancomycin (VANCOCIN) 500 mg in sodium chloride 0.9 % 100 mL IVPB     500 mg 100 mL/hr over 60 Minutes Intravenous  Once 05/22/17 1733 05/22/17 2042   05/22/17 1115  vancomycin (VANCOCIN) IVPB 1000 mg/200 mL premix     1,000 mg 200 mL/hr over 60 Minutes Intravenous  Once 05/22/17 1100 05/22/17 1220   05/22/17 1115  piperacillin-tazobactam (ZOSYN) IVPB 3.375 g     3.375 g 100 mL/hr over 30 Minutes Intravenous  Once 05/22/17 1100 05/22/17 1744   05/22/17 0000  azithromycin (ZITHROMAX) 250 MG tablet  250 mg Oral Daily 05/22/17 1051         Subjective: -feels ok, waiting to start Suboxone per Dr.VIncent   Objective: Vitals:   06/25/17 0601 06/25/17 1317 06/25/17 2215 06/26/17 0555  BP: 119/75 115/72 99/81 120/79  Pulse: 98 96 (!) 113 87  Resp: 18 17 17 17   Temp: 98.3 F (36.8 C) 98 F (36.7 C) 98 F (36.7 C) 98.6 F (37 C)  TempSrc: Oral Oral    SpO2: 99% 100% 98% 100%  Weight: 76.6 kg (168 lb 12.8 oz)   78.7 kg (173 lb 8 oz)  Height:        Intake/Output Summary  (Last 24 hours) at 06/26/2017 1724 Last data filed at 06/26/2017 1511 Gross per 24 hour  Intake 560 ml  Output -  Net 560 ml   Filed Weights   06/24/17 0602 06/25/17 0601 06/26/17 0555  Weight: 76.1 kg (167 lb 11.2 oz) 76.6 kg (168 lb 12.8 oz) 78.7 kg (173 lb 8 oz)    Examination:  Gen: Awake, Alert, Oriented X 3,  HEENT: PERRLA, Neck supple, no JVD Lungs: Good air movement bilaterally, CTAB CVS: RRR,no murmurs Abd: soft, Non tender, non distended, BS present Extremities: No Cyanosis, Clubbing or edema Skin: numerous tattoos, areas of swelling and inflammation near elbow much improved    Data Reviewed: I have personally reviewed following labs and imaging studies  CBC: Recent Labs  Lab 06/25/17 0459  WBC 9.7  HGB 10.7*  HCT 34.5*  MCV 84.8  PLT 761   Basic Metabolic Panel: Recent Labs  Lab 06/25/17 0459  NA 138  K 4.0  CL 102  CO2 25  GLUCOSE 103*  BUN 12  CREATININE 0.65  CALCIUM 9.1   GFR: Estimated Creatinine Clearance: 148.9 mL/min (by C-G formula based on SCr of 0.65 mg/dL). Liver Function Tests: No results for input(s): AST, ALT, ALKPHOS, BILITOT, PROT, ALBUMIN in the last 168 hours. No results for input(s): LIPASE, AMYLASE in the last 168 hours. No results for input(s): AMMONIA in the last 168 hours. Coagulation Profile: No results for input(s): INR, PROTIME in the last 168 hours. Cardiac Enzymes: No results for input(s): CKTOTAL, CKMB, CKMBINDEX, TROPONINI in the last 168 hours. BNP (last 3 results) No results for input(s): PROBNP in the last 8760 hours. HbA1C: No results for input(s): HGBA1C in the last 72 hours. CBG: No results for input(s): GLUCAP in the last 168 hours. Lipid Profile: No results for input(s): CHOL, HDL, LDLCALC, TRIG, CHOLHDL, LDLDIRECT in the last 72 hours. Thyroid Function Tests: No results for input(s): TSH, T4TOTAL, FREET4, T3FREE, THYROIDAB in the last 72 hours. Anemia Panel: No results for input(s): VITAMINB12,  FOLATE, FERRITIN, TIBC, IRON, RETICCTPCT in the last 72 hours. Sepsis Labs: No results for input(s): PROCALCITON, LATICACIDVEN in the last 168 hours.  No results found for this or any previous visit (from the past 240 hour(s)).     Radiology Studies: No results found.    Scheduled Meds: . buprenorphine-naloxone  1 tablet Sublingual BID  . enoxaparin (LOVENOX) injection  40 mg Subcutaneous Q24H  . sodium chloride flush  3 mL Intravenous Q12H   Continuous Infusions: . sodium chloride 10 mL/hr at 06/23/17 1005  .  ceFAZolin (ANCEF) IV 2 g (06/26/17 1430)     LOS: 35 days    Time spent: 15 minutes   Bonnell Public, MD [facility] [email or web address] Password [facility] 06/26/2017, 5:24 PM

## 2017-06-27 NOTE — Progress Notes (Addendum)
PROGRESS NOTE    Hunter [name]  KTG:[id number] DOB: 1985-08-08 DOA: 05/22/2017 PCP: Patient, No Pcp Per     Brief Narrative:  Hunter [name] is a 32 yo male with history of IV drug use, prior hand infection and group A streptococcal bacteremia, status post surgery in November discharged on Augmentin which he stopped taking now admitted with MSSA bacteremia and sepsis, lumbar disc infection with psoas abscess. He is currently being treated with 6 week course of IV ancef until 1/26 IM Dr.Vincent following, plan to start Suboxone this admission and then FU with Clinic in [place] that provide affordable MAT  06/27/2017 - No new complaints. No fever or chills. No SOB or chest pain.  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 No epidural abscess. -Blood cultures grew MSSA, repeat blood cultures with no growth to date -TEE did not show any evidence of definitive vegetation -CSW following, not appropriate for discharge home with PICC line given IV drug abuse, will stay inpatient to complete antibiotic course -ID consulted and now signed off. See last progress note 12/21 for recommendations. Continue IV ancef for 6 weeks, last day 1/26. Weekly CBC, BMP, CRP, ESR. Call ID back near end of antibiotics.  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 No heroin or opiates in a long time.  -Patient planning to eventually move back to [place] to live with his dad -Appreciate Dr. Autumn Patty assistance plan to start suboxone likely today, we was provided a list of clinics in [place] that offer MAT that is affordable   HCV -+AB, Genotype 2b, HCV RNA 6.547, hep c quantitative 3,520, 000 -Follow up with ID   Normocytic  anemia -Stable, baseline Hgb ~11   DVT prophylaxis: lovenox Code Status: full Family Communication: none at bedside Disposition Plan: inpatient until completion of IV antibiotics    Consultants:   ID  Cardiology  IMTS for suboxone   Procedures:   TEE 12/21, negative for vegetation   Antimicrobials:  Anti-infectives (From admission, onward)   Start     Dose/Rate Route Frequency Ordered Stop   05/23/17 1700  ceFAZolin (ANCEF) IVPB 2g/100 mL premix     2 g 200 mL/hr over 30 Minutes Intravenous Every 8 hours 05/23/17 1628 07/03/17 2359   05/23/17 0400  vancomycin (VANCOCIN) IVPB 1000 mg/200 mL premix  Status:  Discontinued     1,000 mg 200 mL/hr over 60 Minutes Intravenous Every 8 hours 05/22/17 1733 05/23/17 1455   05/22/17 2000  piperacillin-tazobactam (ZOSYN) IVPB 3.375 g  Status:  Discontinued     3.375 g 12.5 mL/hr over 240 Minutes Intravenous Every 8 hours 05/22/17 1733 05/23/17 1628   05/22/17 1745  vancomycin (VANCOCIN) 500 mg in sodium chloride 0.9 % 100 mL IVPB     500 mg 100 mL/hr over 60 Minutes Intravenous  Once 05/22/17 1733 05/22/17 2042   05/22/17 1115  vancomycin (VANCOCIN) IVPB 1000 mg/200 mL premix     1,000 mg 200 mL/hr over 60 Minutes Intravenous  Once 05/22/17 1100 05/22/17 1220   05/22/17 1115  piperacillin-tazobactam (ZOSYN) IVPB 3.375 g     3.375 g 100 mL/hr over 30 Minutes Intravenous  Once 05/22/17 1100 05/22/17 1744   05/22/17 0000  azithromycin (ZITHROMAX) 250 MG tablet  250 mg Oral Daily 05/22/17 1051         Subjective: -feels ok, waiting to start Suboxone per Dr.VIncent   Objective: Vitals:   06/26/17 0555 06/26/17 1510 06/26/17 2120 06/27/17 0524  BP: 120/79 113/71 113/68 114/69  Pulse: 87 91 86 80  Resp: _0 Temp: 98.6 F (37 C) 98 F (36.7 C) 97.8 F (36.6 C) 98.2 F (36.8 C)  TempSrc:   Oral Oral  SpO2: 100% 95% 98% 98%  Weight: 78.7 kg (173 lb 8 oz)   80.9 kg (178 lb 5.6 oz)  Height:         Intake/Output Summary (Last 24 hours) at 06/27/2017 1216 Last data filed at 06/26/2017 1511 Gross per 24 hour  Intake 440 ml  Output -  Net 440 ml   Filed Weights   06/25/17 0601 06/26/17 0555 06/27/17 0524  Weight: 76.6 kg (168 lb 12.8 oz) 78.7 kg (173 lb 8 oz) 80.9 kg (178 lb 5.6 oz)    Examination:  Gen: Awake, Alert, Oriented X 3,  HEENT: PERRLA, Neck supple, no JVD Lungs: Good air movement bilaterally, CTAB CVS: RRR,no murmurs Abd: soft, Non tender, non distended, BS present Extremities: No Cyanosis, Clubbing or edema Skin: numerous tattoos, areas of swelling and inflammation near elbow much improved    Data Reviewed: I have personally reviewed following labs and imaging studies  CBC: Recent Labs  Lab 06/25/17 0459  WBC 9.7  HGB 10.7*  HCT 34.5*  MCV 84.8  PLT 622   Basic Metabolic Panel: Recent Labs  Lab 06/25/17 0459  NA 138  K 4.0  CL 102  CO2 25  GLUCOSE 103*  BUN 12  CREATININE 0.65  CALCIUM 9.1   GFR: Estimated Creatinine Clearance: 151.2 mL/min (by C-G formula based on SCr of 0.65 mg/dL). Liver Function Tests: No results for input(s): AST, ALT, ALKPHOS, BILITOT, PROT, ALBUMIN in the last 168 hours. No results for input(s): LIPASE, AMYLASE in the last 168 hours. No results for input(s): AMMONIA in the last 168 hours. Coagulation Profile: No results for input(s): INR, PROTIME in the last 168 hours. Cardiac Enzymes: No results for input(s): CKTOTAL, CKMB, CKMBINDEX, TROPONINI in the last 168 hours. BNP (last 3 results) No results for input(s): PROBNP in the last 8760 hours. HbA1C: No results for input(s): HGBA1C in the last 72 hours. CBG: No results for input(s): GLUCAP in the last 168 hours. Lipid Profile: No results for input(s): CHOL, HDL, LDLCALC, TRIG, CHOLHDL, LDLDIRECT in the last 72 hours. Thyroid Function Tests: No results for input(s): TSH, T4TOTAL, FREET4, T3FREE, THYROIDAB in the last 72 hours. Anemia Panel: No results for  input(s): VITAMINB12, FOLATE, FERRITIN, TIBC, IRON, RETICCTPCT in the last 72 hours. Sepsis Labs: No results for input(s): PROCALCITON, LATICACIDVEN in the last 168 hours.  No results found for this or any previous visit (from the past 240 hour(s)).     Radiology Studies: No results found.    Scheduled Meds: . buprenorphine-naloxone  1 tablet Sublingual BID  . enoxaparin (LOVENOX) injection  40 mg Subcutaneous Q24H  . sodium chloride flush  3 mL Intravenous Q12H   Continuous Infusions: . sodium chloride 10 mL/hr at 06/23/17 1005  .  ceFAZolin (ANCEF) IV 2 g (06/27/17 0520)     LOS: 36 days    Time spent: 15 minutes   Bonnell Public, MD [facility] [email or web address] Password [email or web address] 06/27/2017, 12:16 PM

## 2017-06-28 NOTE — Progress Notes (Addendum)
PROGRESS NOTE    Hunter [name]  LTJ:[id number] DOB: [birth year] DOA: 05/22/2017 PCP: Patient, No Pcp Per     Brief Narrative:  Hunter [name] is a 32 yo male with history of IV drug use, prior hand infection and group A streptococcal bacteremia, status post surgery in November discharged on Augmentin which he stopped taking now admitted with MSSA bacteremia and sepsis, lumbar disc infection with psoas abscess. He is currently being treated with 6 week course of IV ancef until 1/26 IM Dr.Vincent following, plan to start Suboxone this admission and then FU with Clinic in [place] that provide affordable MAT  06/28/2017 - No new complaints. No fever or chills.  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 No epidural abscess. -Blood cultures grew MSSA, repeat blood cultures with no growth to date -TEE did not show any evidence of definitive vegetation -CSW following, not appropriate for discharge home with PICC line given IV drug abuse, will stay inpatient to complete antibiotic course -ID consulted and now signed off. See last progress note 12/21 for recommendations. Continue IV ancef for 6 weeks, last day 1/26. Weekly CBC, BMP, CRP, ESR. Call ID back near end of antibiotics.  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 No heroin or opiates in a long time.  -Patient planning to eventually move back to [place] to live with his dad -Appreciate Dr. Autumn Patty assistance plan to start suboxone likely today, we was provided a list of clinics in [place] that offer MAT that is affordable   HCV -+AB, Genotype 2b, HCV RNA 6.547, hep c quantitative 3,520, 000 -Follow up with ID   Normocytic anemia -Stable,  baseline Hgb ~11   DVT prophylaxis: lovenox Code Status: full Family Communication: none at bedside Disposition Plan: inpatient until completion of IV antibiotics    Consultants:   ID  Cardiology  IMTS for suboxone   Procedures:   TEE 12/21, negative for vegetation   Antimicrobials:  Anti-infectives (From admission, onward)   Start     Dose/Rate Route Frequency Ordered Stop   05/23/17 1700  ceFAZolin (ANCEF) IVPB 2g/100 mL premix     2 g 200 mL/hr over 30 Minutes Intravenous Every 8 hours 05/23/17 1628 07/03/17 2359   05/23/17 0400  vancomycin (VANCOCIN) IVPB 1000 mg/200 mL premix  Status:  Discontinued     1,000 mg 200 mL/hr over 60 Minutes Intravenous Every 8 hours 05/22/17 1733 05/23/17 1455   05/22/17 2000  piperacillin-tazobactam (ZOSYN) IVPB 3.375 g  Status:  Discontinued     3.375 g 12.5 mL/hr over 240 Minutes Intravenous Every 8 hours 05/22/17 1733 05/23/17 1628   05/22/17 1745  vancomycin (VANCOCIN) 500 mg in sodium chloride 0.9 % 100 mL IVPB     500 mg 100 mL/hr over 60 Minutes Intravenous  Once 05/22/17 1733 05/22/17 2042   05/22/17 1115  vancomycin (VANCOCIN) IVPB 1000 mg/200 mL premix     1,000 mg 200 mL/hr over 60 Minutes Intravenous  Once 05/22/17 1100 05/22/17 1220   05/22/17 1115  piperacillin-tazobactam (ZOSYN) IVPB 3.375 g     3.375 g 100 mL/hr over 30 Minutes Intravenous  Once 05/22/17 1100 05/22/17 1744   05/22/17 0000  azithromycin (ZITHROMAX) 250 MG tablet     250  mg Oral Daily 05/22/17 1051         Subjective: -feels ok, waiting to start Suboxone per Dr.VIncent   Objective: Vitals:   06/27/17 0524 06/27/17 1526 06/27/17 2124 06/28/17 0520  BP: 114/69 123/83 127/85 117/71  Pulse: 80 (!) 116 (!) 115 96  Resp: _0 Temp: 98.2 F (36.8 C) (!) 97.5 F (36.4 C) 97.9 F (36.6 C) 98.5 F (36.9 C)  TempSrc: Oral Oral Oral Oral  SpO2: 98% 99% 99% 97%  Weight: 80.9 kg (178 lb 5.6 oz)     Height:        Intake/Output Summary  (Last 24 hours) at 06/28/2017 1120 Last data filed at 06/27/2017 1531 Gross per 24 hour  Intake 1192.67 ml  Output -  Net 1192.67 ml   Filed Weights   06/25/17 0601 06/26/17 0555 06/27/17 0524  Weight: 76.6 kg (168 lb 12.8 oz) 78.7 kg (173 lb 8 oz) 80.9 kg (178 lb 5.6 oz)    Examination:  Gen: Awake, Alert, Oriented X 3,  HEENT: PERRLA, Neck supple, no JVD Lungs: Good air movement bilaterally, CTAB CVS: RRR,no murmurs Abd: soft, Non tender, non distended, BS present Extremities: No Cyanosis, Clubbing or edema Skin: numerous tattoos, areas of swelling and inflammation near elbow much improved    Data Reviewed: I have personally reviewed following labs and imaging studies  CBC: Recent Labs  Lab 06/25/17 0459  WBC 9.7  HGB 10.7*  HCT 34.5*  MCV 84.8  PLT 330   Basic Metabolic Panel: Recent Labs  Lab 06/25/17 0459  NA 138  K 4.0  CL 102  CO2 25  GLUCOSE 103*  BUN 12  CREATININE 0.65  CALCIUM 9.1   GFR: Estimated Creatinine Clearance: 151.2 mL/min (by C-G formula based on SCr of 0.65 mg/dL). Liver Function Tests: No results for input(s): AST, ALT, ALKPHOS, BILITOT, PROT, ALBUMIN in the last 168 hours. No results for input(s): LIPASE, AMYLASE in the last 168 hours. No results for input(s): AMMONIA in the last 168 hours. Coagulation Profile: No results for input(s): INR, PROTIME in the last 168 hours. Cardiac Enzymes: No results for input(s): CKTOTAL, CKMB, CKMBINDEX, TROPONINI in the last 168 hours. BNP (last 3 results) No results for input(s): PROBNP in the last 8760 hours. HbA1C: No results for input(s): HGBA1C in the last 72 hours. CBG: No results for input(s): GLUCAP in the last 168 hours. Lipid Profile: No results for input(s): CHOL, HDL, LDLCALC, TRIG, CHOLHDL, LDLDIRECT in the last 72 hours. Thyroid Function Tests: No results for input(s): TSH, T4TOTAL, FREET4, T3FREE, THYROIDAB in the last 72 hours. Anemia Panel: No results for input(s):  VITAMINB12, FOLATE, FERRITIN, TIBC, IRON, RETICCTPCT in the last 72 hours. Sepsis Labs: No results for input(s): PROCALCITON, LATICACIDVEN in the last 168 hours.  No results found for this or any previous visit (from the past 240 hour(s)).     Radiology Studies: No results found.    Scheduled Meds: . buprenorphine-naloxone  1 tablet Sublingual BID  . enoxaparin (LOVENOX) injection  40 mg Subcutaneous Q24H  . sodium chloride flush  3 mL Intravenous Q12H   Continuous Infusions: . sodium chloride 10 mL/hr at 06/23/17 1005  .  ceFAZolin (ANCEF) IV 2 g (06/28/17 0537)     LOS: 37 days    Time spent: 15 minutes   Bonnell Public, MD [facility] [email or web address] Password [facility] 06/28/2017, 11:20 AM

## 2017-06-29 NOTE — Care Management (Signed)
Discussed  In LOS 06/29/17 - Pt remains appropriate for continued stay.  Pt remains on IV antibiotics

## 2017-06-29 NOTE — Progress Notes (Addendum)
PROGRESS NOTE    Hunter [name]  NAT:[id number] DOB: 1986/02/02 DOA: 05/22/2017 PCP: Patient, No Pcp Per     Brief Narrative:  Hunter [name] is a 32 yo male with history of IV drug use, prior hand infection and group A streptococcal bacteremia, status post surgery in November, discharged on Augmentin which he stopped taking. Patient is admitted with MSSA bacteremia and sepsis, lumbar disc infection with psoas abscess. Patient is currently being treated with 6 week course of IV Ancef, to be completed on 07/03/2017. Patient has been started on Suboxone on this admission and the plan is for the patient to follow up with a Clinic in [place] that provides affordable MAT  06/29/2017 - No new complaints. No fever or chills.  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 No epidural abscess. -Blood cultures grew MSSA, repeat blood cultures with no growth to date -TEE did not show any evidence of definitive vegetation -CSW following, not appropriate for discharge home with PICC line given IV drug abuse, will stay inpatient to complete antibiotic course -ID consulted and now signed off. See last progress note 12/21 for recommendations. Continue IV ancef for 6 weeks, last day 1/26. Weekly CBC, BMP, CRP, ESR. Call ID back near end of antibiotics.   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 No heroin or opiates in a long time.  -Patient planning to eventually move back to [place] to live with his dad -Appreciate Dr. Autumn Patty assistance plan to start suboxone, he was provided a list of clinics in [place] that offer MAT that is affordable   HCV -+AB, Genotype 2b, HCV RNA 6.547, hep c quantitative 3,520, 000 -Follow up with ID   Normocytic  anemia -Stable, baseline Hgb ~11   DVT prophylaxis: lovenox Code Status: full Family Communication: none at bedside Disposition Plan: inpatient until completion of IV antibiotics    Consultants:   ID  Cardiology  IMTS for suboxone   Procedures:   TEE 12/21, negative for vegetation   Antimicrobials:  Anti-infectives (From admission, onward)   Start     Dose/Rate Route Frequency Ordered Stop   05/23/17 1700  ceFAZolin (ANCEF) IVPB 2g/100 mL premix     2 g 200 mL/hr over 30 Minutes Intravenous Every 8 hours 05/23/17 1628 07/03/17 2359   05/23/17 0400  vancomycin (VANCOCIN) IVPB 1000 mg/200 mL premix  Status:  Discontinued     1,000 mg 200 mL/hr over 60 Minutes Intravenous Every 8 hours 05/22/17 1733 05/23/17 1455   05/22/17 2000  piperacillin-tazobactam (ZOSYN) IVPB 3.375 g  Status:  Discontinued     3.375 g 12.5 mL/hr over 240 Minutes Intravenous Every 8 hours 05/22/17 1733 05/23/17 1628   05/22/17 1745  vancomycin (VANCOCIN) 500 mg in sodium chloride 0.9 % 100 mL IVPB     500 mg 100 mL/hr over 60 Minutes Intravenous  Once 05/22/17 1733 05/22/17 2042   05/22/17 1115  vancomycin (VANCOCIN) IVPB 1000 mg/200 mL premix     1,000 mg 200 mL/hr over 60 Minutes Intravenous  Once 05/22/17 1100 05/22/17 1220   05/22/17 1115  piperacillin-tazobactam (ZOSYN) IVPB 3.375 g     3.375 g 100 mL/hr over 30 Minutes Intravenous  Once 05/22/17 1100 05/22/17 1744   05/22/17 0000  azithromycin (ZITHROMAX) 250 MG  tablet     250 mg Oral Daily 05/22/17 1051         Subjective: - No new complaints. No fever or chills. Comfortable in his room watching Television.   Objective: Vitals:   06/28/17 0520 06/28/17 1510 06/28/17 2149 06/29/17 0538  BP: 117/71 113/65 122/73 117/71  Pulse: 96 97 (!) 108 (!) 108  Resp: 18  18 18   Temp: 98.5 F (36.9 C) 98.3 F (36.8 C) 97.8 F (36.6 C) 98.6 F (37 C)  TempSrc: Oral Oral Oral   SpO2: 97% 96% 98% 98%  Weight:      Height:         Intake/Output Summary (Last 24 hours) at 06/29/2017 0845 Last data filed at 06/29/2017 0643 Gross per 24 hour  Intake 1022 ml  Output -  Net 1022 ml   Filed Weights   06/25/17 0601 06/26/17 0555 06/27/17 0524  Weight: 76.6 kg (168 lb 12.8 oz) 78.7 kg (173 lb 8 oz) 80.9 kg (178 lb 5.6 oz)    Examination:  Gen: Awake, Alert, Oriented X 3,  HEENT: PERRLA, Neck supple, no JVD Lungs: Good air movement bilaterally, CTAB CVS: RRR,no murmurs Abd: soft, Non tender, non distended, BS present Extremities: No Cyanosis, Clubbing or edema Skin: numerous tattoos.   Data Reviewed: I have personally reviewed following labs and imaging studies  CBC: Recent Labs  Lab 06/25/17 0459  WBC 9.7  HGB 10.7*  HCT 34.5*  MCV 84.8  PLT 388   Basic Metabolic Panel: Recent Labs  Lab 06/25/17 0459  NA 138  K 4.0  CL 102  CO2 25  GLUCOSE 103*  BUN 12  CREATININE 0.65  CALCIUM 9.1   GFR: Estimated Creatinine Clearance: 151.2 mL/min (by C-G formula based on SCr of 0.65 mg/dL). Liver Function Tests: No results for input(s): AST, ALT, ALKPHOS, BILITOT, PROT, ALBUMIN in the last 168 hours. No results for input(s): LIPASE, AMYLASE in the last 168 hours. No results for input(s): AMMONIA in the last 168 hours. Coagulation Profile: No results for input(s): INR, PROTIME in the last 168 hours. Cardiac Enzymes: No results for input(s): CKTOTAL, CKMB, CKMBINDEX, TROPONINI in the last 168 hours. BNP (last 3 results) No results for input(s): PROBNP in the last 8760 hours. HbA1C: No results for input(s): HGBA1C in the last 72 hours. CBG: No results for input(s): GLUCAP in the last 168 hours. Lipid Profile: No results for input(s): CHOL, HDL, LDLCALC, TRIG, CHOLHDL, LDLDIRECT in the last 72 hours. Thyroid Function Tests: No results for input(s): TSH, T4TOTAL, FREET4, T3FREE, THYROIDAB in the last 72 hours. Anemia Panel: No results for input(s): VITAMINB12, FOLATE, FERRITIN, TIBC, IRON,  RETICCTPCT in the last 72 hours. Sepsis Labs: No results for input(s): PROCALCITON, LATICACIDVEN in the last 168 hours.  No results found for this or any previous visit (from the past 240 hour(s)).     Radiology Studies: No results found.    Scheduled Meds: . buprenorphine-naloxone  1 tablet Sublingual BID  . enoxaparin (LOVENOX) injection  40 mg Subcutaneous Q24H  . sodium chloride flush  3 mL Intravenous Q12H   Continuous Infusions: . sodium chloride 10 mL/hr at 06/23/17 1005  .  ceFAZolin (ANCEF) IV 2 g (06/29/17 0524)     LOS: 38 days    Time spent: 15 minutes   Bonnell Public, MD [facility] [email or web address] Password [name] 06/29/2017, 8:45 AM

## 2017-06-30 NOTE — Progress Notes (Addendum)
PROGRESS NOTE    Hunter [name] [facility]  OFH:[id number] DOB: 1985/12/31 DOA: 05/22/2017 PCP: Patient, No Pcp Per     Brief Narrative:  Hunter [name] is a 32 yo male with history of IV drug use, prior hand infection and group A streptococcal bacteremia, status post surgery in November, discharged on Augmentin which he stopped taking. Patient is admitted with MSSA bacteremia and sepsis, lumbar disc infection with psoas abscess. Patient is currently being treated with 6 week course of IV Ancef, to be completed on 07/03/2017. Patient has been started on Suboxone on this admission and the plan is for the patient to follow up with a Clinic in [place] that provides affordable MAT.  06/30/2017 - Patient seen and examined at his bedside. He has no new complaints.  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 No epidural abscess. -Blood cultures grew MSSA, repeat blood cultures with no growth to date -TEE did not show any evidence of definitive vegetation -CSW following, not appropriate for discharge home with PICC line given IV drug abuse, will stay inpatient to complete antibiotic course -ID consulted and now signed off. See last progress note 12/21 for recommendations. Continue IV ancef for 6 weeks, last day 1/26. Weekly CBC, BMP, CRP, ESR. Call ID back near end of antibiotics.   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 No heroin or opiates in a long time.  -Patient planning to eventually move back to [place] to live with his dad -Appreciate Dr. Autumn Patty assistance plan to start suboxone, he was provided a list of clinics in [place] that offer MAT that is affordable.   HCV -+AB, Genotype 2b, HCV RNA 6.547, hep c quantitative 3,520, 000 -Follow  up with ID   Normocytic anemia -Stable, baseline Hgb ~11   DVT prophylaxis: lovenox sq Code Status: full Family Communication: none at bedside Disposition Plan: inpatient until completion of IV antibiotics 07/03/17   Consultants:   ID  Cardiology  IMTS for suboxone   Procedures:   TEE 12/21, negative for vegetation   Antimicrobials:  Anti-infectives (From admission, onward)   Start     Dose/Rate Route Frequency Ordered Stop   05/23/17 1700  ceFAZolin (ANCEF) IVPB 2g/100 mL premix     2 g 200 mL/hr over 30 Minutes Intravenous Every 8 hours 05/23/17 1628 07/03/17 2359   05/23/17 0400  vancomycin (VANCOCIN) IVPB 1000 mg/200 mL premix  Status:  Discontinued     1,000 mg 200 mL/hr over 60 Minutes Intravenous Every 8 hours 05/22/17 1733 05/23/17 1455   05/22/17 2000  piperacillin-tazobactam (ZOSYN) IVPB 3.375 g  Status:  Discontinued     3.375 g 12.5 mL/hr over 240 Minutes Intravenous Every 8 hours 05/22/17 1733 05/23/17 1628   05/22/17 1745  vancomycin (VANCOCIN) 500 mg in sodium chloride 0.9 % 100 mL IVPB     500 mg 100 mL/hr over 60 Minutes Intravenous  Once 05/22/17 1733 05/22/17 2042   05/22/17 1115  vancomycin (VANCOCIN) IVPB 1000 mg/200 mL premix     1,000 mg 200 mL/hr over 60 Minutes Intravenous  Once 05/22/17 1100 05/22/17 1220   05/22/17 1115  piperacillin-tazobactam (ZOSYN) IVPB 3.375 g     3.375 g 100 mL/hr over 30 Minutes Intravenous  Once 05/22/17 1100 05/22/17 1744   05/22/17  0000  azithromycin (ZITHROMAX) 250 MG tablet     250 mg Oral Daily 05/22/17 1051        Objective: Vitals:   06/29/17 1853 06/29/17 2233 06/30/17 0555 06/30/17 0645  BP:  105/62  118/71  Pulse: (!) 121 (!) 116  (!) 113  Resp:  18  16  Temp:  99.6 F (37.6 C)  98.8 F (37.1 C)  TempSrc:  Oral  Oral  SpO2: 94% 100%  99%  Weight:   79.5 kg (175 lb 4.8 oz)   Height:        Intake/Output Summary (Last 24 hours) at 06/30/2017 0723 Last data filed at 06/30/2017 8811 Gross per  24 hour  Intake 344.5 ml  Output -  Net 344.5 ml   Filed Weights   06/26/17 0555 06/27/17 0524 06/30/17 0555  Weight: 78.7 kg (173 lb 8 oz) 80.9 kg (178 lb 5.6 oz) 79.5 kg (175 lb 4.8 oz)    Examination:  Seen and examined 06/30/17- no changes in his physical exam Gen: Awake, Alert, Oriented X 3,  HEENT: PERRLA, Neck supple, no JVD Lungs: Good air movement bilaterally, CTAB CVS: RRR,no murmurs Abd: soft, Non tender, non distended, BS present Extremities: No Cyanosis, Clubbing or edema Skin: numerous tattoos.   Data Reviewed: I have personally reviewed following labs and imaging studies  CBC: Recent Labs  Lab 06/25/17 0459  WBC 9.7  HGB 10.7*  HCT 34.5*  MCV 84.8  PLT 031   Basic Metabolic Panel: Recent Labs  Lab 06/25/17 0459  NA 138  K 4.0  CL 102  CO2 25  GLUCOSE 103*  BUN 12  CREATININE 0.65  CALCIUM 9.1   GFR: Estimated Creatinine Clearance: 150.4 mL/min (by C-G formula based on SCr of 0.65 mg/dL). Liver Function Tests: No results for input(s): AST, ALT, ALKPHOS, BILITOT, PROT, ALBUMIN in the last 168 hours. No results for input(s): LIPASE, AMYLASE in the last 168 hours. No results for input(s): AMMONIA in the last 168 hours. Coagulation Profile: No results for input(s): INR, PROTIME in the last 168 hours. Cardiac Enzymes: No results for input(s): CKTOTAL, CKMB, CKMBINDEX, TROPONINI in the last 168 hours. BNP (last 3 results) No results for input(s): PROBNP in the last 8760 hours. HbA1C: No results for input(s): HGBA1C in the last 72 hours. CBG: No results for input(s): GLUCAP in the last 168 hours. Lipid Profile: No results for input(s): CHOL, HDL, LDLCALC, TRIG, CHOLHDL, LDLDIRECT in the last 72 hours. Thyroid Function Tests: No results for input(s): TSH, T4TOTAL, FREET4, T3FREE, THYROIDAB in the last 72 hours. Anemia Panel: No results for input(s): VITAMINB12, FOLATE, FERRITIN, TIBC, IRON, RETICCTPCT in the last 72 hours. Sepsis Labs: No  results for input(s): PROCALCITON, LATICACIDVEN in the last 168 hours.  No results found for this or any previous visit (from the past 240 hour(s)).     Radiology Studies: No results found.    Scheduled Meds: . buprenorphine-naloxone  1 tablet Sublingual BID  . enoxaparin (LOVENOX) injection  40 mg Subcutaneous Q24H  . sodium chloride flush  3 mL Intravenous Q12H   Continuous Infusions: . sodium chloride 10 mL/hr at 06/23/17 1005  .  ceFAZolin (ANCEF) IV Stopped (06/30/17 0549)     LOS: 39 days    Time spent: 15 minutes   Kayleen Memos, MD [facility] [email or web address] Password [facility] 06/30/2017, 7:23 AM

## 2017-07-01 NOTE — Progress Notes (Addendum)
PROGRESS NOTE    Hunter [name] [facility]  LDJ:[id number] DOB: 05-08-1986 DOA: 05/22/2017 PCP: Patient, No Pcp Per     Brief Narrative:  Hunter [name] is a 32 yo male with history of IV drug use, prior hand infection and group A streptococcal bacteremia, status post surgery in November, discharged on Augmentin which he stopped taking. Patient is admitted with MSSA bacteremia and sepsis, lumbar disc infection with psoas abscess. Patient is currently being treated with 6 week course of IV Ancef, to be completed on 07/03/2017. Patient has been started on Suboxone on this admission and the plan is for the patient to follow up with a Clinic in [place] that provides affordable MAT.  07/01/2017 - Patient seen and examined at his bedside. He has no new complaints.   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 No epidural abscess. -Blood cultures grew MSSA, repeat blood cultures with no growth to date -TEE did not show any evidence of definitive vegetation -CSW following, not appropriate for discharge home with PICC line given IV drug abuse, will stay inpatient to complete antibiotic course -ID consulted and now signed off. See last progress note 12/21 for recommendations. Continue IV ancef for 6 weeks, last day 1/26. Weekly CBC, BMP, CRP, ESR. Call ID back near end of antibiotics.  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 No heroin or opiates in a long time.  -Patient planning to eventually move back to [place] to live with his dad -Appreciate Dr. Autumn Patty assistance plan to start suboxone, he was provided a list of clinics in [place] that offer MAT that is affordable.   HCV -+AB, Genotype 2b, HCV RNA  6.547, hep c quantitative 3,520, 000 -Follow up with ID   Normocytic anemia -Stable, baseline Hgb ~11 -07/01/17: will get CBC am   DVT prophylaxis: lovenox sq Code Status: full Family Communication: none at bedside Disposition Plan: inpatient until completion of IV antibiotics 07/03/17   Consultants:   ID  Cardiology  IMTS for suboxone   Procedures:   TEE 12/21, negative for vegetation   Antimicrobials:  Anti-infectives (From admission, onward)   Start     Dose/Rate Route Frequency Ordered Stop   05/23/17 1700  ceFAZolin (ANCEF) IVPB 2g/100 mL premix     2 g 200 mL/hr over 30 Minutes Intravenous Every 8 hours 05/23/17 1628 07/03/17 2359   05/23/17 0400  vancomycin (VANCOCIN) IVPB 1000 mg/200 mL premix  Status:  Discontinued     1,000 mg 200 mL/hr over 60 Minutes Intravenous Every 8 hours 05/22/17 1733 05/23/17 1455   05/22/17 2000  piperacillin-tazobactam (ZOSYN) IVPB 3.375 g  Status:  Discontinued     3.375 g 12.5 mL/hr over 240 Minutes Intravenous Every 8 hours 05/22/17 1733 05/23/17 1628   05/22/17 1745  vancomycin (VANCOCIN) 500 mg in sodium chloride 0.9 % 100 mL IVPB     500 mg 100 mL/hr over 60 Minutes Intravenous  Once 05/22/17 1733 05/22/17 2042   05/22/17 1115  vancomycin (VANCOCIN) IVPB 1000 mg/200 mL premix     1,000 mg 200 mL/hr over 60 Minutes Intravenous  Once 05/22/17 1100 05/22/17 1220   05/22/17 1115  piperacillin-tazobactam (ZOSYN) IVPB 3.375 g     3.375 g 100 mL/hr  over 30 Minutes Intravenous  Once 05/22/17 1100 05/22/17 1744   05/22/17 0000  azithromycin (ZITHROMAX) 250 MG tablet     250 mg Oral Daily 05/22/17 1051        Objective: Vitals:   06/30/17 0645 06/30/17 1345 06/30/17 2200 07/01/17 0628  BP: 118/71 105/63 127/78 (!) 117/58  Pulse: (!) 113 97 (!) 115 (!) 122  Resp: _0 Temp: 98.8 F (37.1 C) 98.3 F (36.8 C) 98.2 F (36.8 C) 99.6 F (37.6 C)  TempSrc: Oral Oral Oral Oral  SpO2: 99% 98% 99% 96%  Weight:    83.7 kg  (184 lb 8.4 oz)  Height:        Intake/Output Summary (Last 24 hours) at 07/01/2017 0813 Last data filed at 07/01/2017 0533 Gross per 24 hour  Intake 740 ml  Output -  Net 740 ml   Filed Weights   06/27/17 0524 06/30/17 0555 07/01/17 0628  Weight: 80.9 kg (178 lb 5.6 oz) 79.5 kg (175 lb 4.8 oz) 83.7 kg (184 lb 8.4 oz)    Examination:  Seen and examined 07/01/17- no changes in his physical exam Gen: Awake, Alert, Oriented X 3,  HEENT: PERRLA, Neck supple, no JVD Lungs: Good air movement bilaterally, CTAB CVS: RRR,no murmurs Abd: soft, Non tender, non distended, BS present Extremities: No Cyanosis, Clubbing or edema Skin: numerous tattoos.   Data Reviewed: I have personally reviewed following labs and imaging studies  CBC: Recent Labs  Lab 06/25/17 0459  WBC 9.7  HGB 10.7*  HCT 34.5*  MCV 84.8  PLT 591   Basic Metabolic Panel: Recent Labs  Lab 06/25/17 0459  NA 138  K 4.0  CL 102  CO2 25  GLUCOSE 103*  BUN 12  CREATININE 0.65  CALCIUM 9.1   GFR: Estimated Creatinine Clearance: 151.2 mL/min (by C-G formula based on SCr of 0.65 mg/dL). Liver Function Tests: No results for input(s): AST, ALT, ALKPHOS, BILITOT, PROT, ALBUMIN in the last 168 hours. No results for input(s): LIPASE, AMYLASE in the last 168 hours. No results for input(s): AMMONIA in the last 168 hours. Coagulation Profile: No results for input(s): INR, PROTIME in the last 168 hours. Cardiac Enzymes: No results for input(s): CKTOTAL, CKMB, CKMBINDEX, TROPONINI in the last 168 hours. BNP (last 3 results) No results for input(s): PROBNP in the last 8760 hours. HbA1C: No results for input(s): HGBA1C in the last 72 hours. CBG: No results for input(s): GLUCAP in the last 168 hours. Lipid Profile: No results for input(s): CHOL, HDL, LDLCALC, TRIG, CHOLHDL, LDLDIRECT in the last 72 hours. Thyroid Function Tests: No results for input(s): TSH, T4TOTAL, FREET4, T3FREE, THYROIDAB in the last 72  hours. Anemia Panel: No results for input(s): VITAMINB12, FOLATE, FERRITIN, TIBC, IRON, RETICCTPCT in the last 72 hours. Sepsis Labs: No results for input(s): PROCALCITON, LATICACIDVEN in the last 168 hours.  No results found for this or any previous visit (from the past 240 hour(s)).     Radiology Studies: No results found.    Scheduled Meds: . buprenorphine-naloxone  1 tablet Sublingual BID  . enoxaparin (LOVENOX) injection  40 mg Subcutaneous Q24H  . sodium chloride flush  3 mL Intravenous Q12H   Continuous Infusions: . sodium chloride 10 mL/hr at 06/23/17 1005  .  ceFAZolin (ANCEF) IV Stopped (07/01/17 0609)     LOS: 40 days    Time spent: 15 minutes   Kayleen Memos, MD [facility] [email or web address] Password [facility] 07/01/2017, 8:13 AM

## 2017-07-01 NOTE — Progress Notes (Addendum)
   [facility] assisted treatment of opioid use disorder. I started him on Buprenorphine on 1/16 and he has done well over the last week. No withdrawal symptoms, cravings are under control, and his pain is under control. He is planning on discharge on Saturday after he finishes antibiotics. His father is going to move him to [place] where he will establish with a suboxone provider. I wrote a paper script for a one week supply of generic Suboxone, 8-2mg  SL tabs BID, #14, and put it on his chart for discharge. He will have to pay out of pocket to fill it at a pharmacy, which I told him is usually around $40. He understands the plan and is grateful for our care. I wished him luck in [place], and to call us if we can be further help here.   Please let me know if there are any questions prior to discharge.  Tyson AliasVincent, Duncan Thomas, MD 07/01/2017, 3:39 PM  Pager: [phone]

## 2017-07-02 LAB — COMPREHENSIVE METABOLIC PANEL
ALBUMIN: 3.2 g/dL — AB (ref 3.5–5.0)
ALK PHOS: 116 U/L (ref 38–126)
ALT: 37 U/L (ref 17–63)
ANION GAP: 12 (ref 5–15)
AST: 41 U/L (ref 15–41)
BILIRUBIN TOTAL: 0.5 mg/dL (ref 0.3–1.2)
BUN: 15 mg/dL (ref 6–20)
CO2: 26 mmol/L (ref 22–32)
Calcium: 9 mg/dL (ref 8.9–10.3)
Chloride: 99 mmol/L — ABNORMAL LOW (ref 101–111)
Creatinine, Ser: 0.78 mg/dL (ref 0.61–1.24)
GFR calc Af Amer: >60 mL/min (ref >60)
GFR calc non Af Amer: >60 mL/min (ref >60)
GLUCOSE: 100 mg/dL — AB (ref 65–99)
POTASSIUM: 4 mmol/L (ref 3.5–5.1)
SODIUM: 137 mmol/L (ref 135–145)
TOTAL PROTEIN: 8.1 g/dL (ref 6.5–8.1)

## 2017-07-02 LAB — CBC
HCT: 33.6 % — ABNORMAL LOW (ref 39.0–52.0)
HEMOGLOBIN: 10.3 g/dL — AB (ref 13.0–17.0)
MCH: 26.1 pg (ref 26.0–34.0)
MCHC: 30.7 g/dL (ref 30.0–36.0)
MCV: 85.1 fL (ref 78.0–100.0)
Platelets: 212 10*3/uL (ref 150–400)
RBC: 3.95 MIL/uL — AB (ref 4.22–5.81)
RDW: 15.3 % (ref 11.5–15.5)
WBC: 6.6 10*3/uL (ref 4.0–10.5)

## 2017-07-02 LAB — C-REACTIVE PROTEIN: CRP: 12 mg/dL — AB (ref <1.0)

## 2017-07-02 LAB — SEDIMENTATION RATE: Sed Rate: 60 mm/hr — ABNORMAL HIGH (ref 0–16)

## 2017-07-02 NOTE — Progress Notes (Signed)
Patient called nursing staff into the room after oral medication administration to state that his IV started hurting. Patient's IV site prior was not hurting or red but is currently. Removed IV with catheter intact and consulted IV team for placement of new IV site. Applied warm compress to site. Will continue to monitor.

## 2017-07-02 NOTE — Progress Notes (Addendum)
PROGRESS NOTE    Jakobee Brackins [facility]  IDP:[id number] DOB: 1986/04/24 DOA: 05/22/2017 PCP: Patient, No Pcp Per     Brief Narrative:  Hunter [name] is a 32 yo male with history of IV drug use, prior hand infection and group A streptococcal bacteremia, status post surgery in November, discharged on Augmentin which he stopped taking. Patient is admitted with MSSA bacteremia and sepsis, lumbar disc infection with psoas abscess. Patient is currently being treated with 6 week course of IV Ancef, to be completed on 07/03/2017. Patient has been started on Suboxone on this admission and the plan is for the patient to follow up with a Clinic in [place] that provides affordable MAT.  07/02/2017 - patient seen and examined at his bedside. Denies any new complaints. He is looking forward to moving to [place] to stay with his father.   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 No epidural abscess. -Blood cultures grew MSSA, repeat blood cultures with no growth to date -TEE did not show any evidence of definitive vegetation -CSW following, not appropriate for discharge home with PICC line given IV drug abuse, will stay inpatient to complete antibiotic course -ID consulted and now signed off. See last progress note 12/21 for recommendations. Continue IV ancef for 6 weeks, last day 1/26. Weekly CBC, BMP, CRP, ESR. Call ID back near end of antibiotics.  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 No heroin or opiates in a long time.  -Patient planning to eventually move back to [place] to live with his dad -Appreciate Dr. Autumn Patty assistance with suboxone, he was provided a list  of clinics in [place] that offer MAT that is affordable.   HCV -+AB, Genotype 2b, HCV RNA 6.547, hep c quantitative 3,520, 000 -continue Follow up with ID   Normocytic anemia -Stable, baseline Hgb ~11 -07/02/17: hg 10.3   DVT prophylaxis: lovenox sq Code Status: full Family Communication: none at bedside Disposition Plan: inpatient until completion of IV antibiotics 07/03/17   Consultants:   ID  Cardiology  IMTS for suboxone   Procedures:   TEE 12/21, negative for vegetation   Antimicrobials:  Anti-infectives (From admission, onward)   Start     Dose/Rate Route Frequency Ordered Stop   05/23/17 1700  ceFAZolin (ANCEF) IVPB 2g/100 mL premix     2 g 200 mL/hr over 30 Minutes Intravenous Every 8 hours 05/23/17 1628 07/03/17 2359   05/23/17 0400  vancomycin (VANCOCIN) IVPB 1000 mg/200 mL premix  Status:  Discontinued     1,000 mg 200 mL/hr over 60 Minutes Intravenous Every 8 hours 05/22/17 1733 05/23/17 1455   05/22/17 2000  piperacillin-tazobactam (ZOSYN) IVPB 3.375 g  Status:  Discontinued     3.375 g 12.5 mL/hr over 240 Minutes Intravenous Every 8 hours 05/22/17 1733 05/23/17 1628   05/22/17 1745  vancomycin (VANCOCIN) 500 mg in sodium chloride 0.9 % 100 mL IVPB     500 mg 100 mL/hr over 60 Minutes Intravenous  Once 05/22/17 1733 05/22/17 2042   05/22/17 1115  vancomycin (VANCOCIN) IVPB 1000 mg/200 mL premix     1,000 mg 200 mL/hr over 60 Minutes Intravenous  Once 05/22/17 1100 05/22/17 1220  05/22/17 1115  piperacillin-tazobactam (ZOSYN) IVPB 3.375 g     3.375 g 100 mL/hr over 30 Minutes Intravenous  Once 05/22/17 1100 05/22/17 1744   05/22/17 0000  azithromycin (ZITHROMAX) 250 MG tablet     250 mg Oral Daily 05/22/17 1051        Objective: Vitals:   07/01/17 0628 07/01/17 1346 07/01/17 2123 07/02/17 0622  BP: (!) 117/58 121/83 96/63 112/72  Pulse: (!) 122 80 93 (!) 104  Resp: _0 Temp: 99.6 F (37.6 C) 98.6 F (37 C) 98.3 F (36.8 C) 98.5 F  (36.9 C)  TempSrc: Oral Oral Oral Oral  SpO2: 96% 94% 99% 99%  Weight: 83.7 kg (184 lb 8.4 oz)   81.8 kg (180 lb 5.4 oz)  Height:        Intake/Output Summary (Last 24 hours) at 07/02/2017 0076 Last data filed at 07/02/2017 0800 Gross per 24 hour  Intake 1295.5 ml  Output -  Net 1295.5 ml   Filed Weights   06/30/17 0555 07/01/17 0628 07/02/17 0622  Weight: 79.5 kg (175 lb 4.8 oz) 83.7 kg (184 lb 8.4 oz) 81.8 kg (180 lb 5.4 oz)    Examination:  Seen and examined 07/02/17- no changes in his physical exam Gen: Awake, Alert, Oriented X 3,  HEENT: PERRLA, Neck supple, no JVD Lungs: Good air movement bilaterally, CTAB CVS: RRR,no murmurs Abd: soft, Non tender, non distended, BS present Extremities: No Cyanosis, Clubbing or edema Skin: numerous tattoos.   Data Reviewed: I have personally reviewed following labs and imaging studies  CBC: No results for input(s): WBC, NEUTROABS, HGB, HCT, MCV, PLT in the last 168 hours. Basic Metabolic Panel: No results for input(s): NA, K, CL, CO2, GLUCOSE, BUN, CREATININE, CALCIUM, MG, PHOS in the last 168 hours. GFR: Estimated Creatinine Clearance: 151.2 mL/min (by C-G formula based on SCr of 0.65 mg/dL). Liver Function Tests: No results for input(s): AST, ALT, ALKPHOS, BILITOT, PROT, ALBUMIN in the last 168 hours. No results for input(s): LIPASE, AMYLASE in the last 168 hours. No results for input(s): AMMONIA in the last 168 hours. Coagulation Profile: No results for input(s): INR, PROTIME in the last 168 hours. Cardiac Enzymes: No results for input(s): CKTOTAL, CKMB, CKMBINDEX, TROPONINI in the last 168 hours. BNP (last 3 results) No results for input(s): PROBNP in the last 8760 hours. HbA1C: No results for input(s): HGBA1C in the last 72 hours. CBG: No results for input(s): GLUCAP in the last 168 hours. Lipid Profile: No results for input(s): CHOL, HDL, LDLCALC, TRIG, CHOLHDL, LDLDIRECT in the last 72 hours. Thyroid Function Tests: No  results for input(s): TSH, T4TOTAL, FREET4, T3FREE, THYROIDAB in the last 72 hours. Anemia Panel: No results for input(s): VITAMINB12, FOLATE, FERRITIN, TIBC, IRON, RETICCTPCT in the last 72 hours. Sepsis Labs: No results for input(s): PROCALCITON, LATICACIDVEN in the last 168 hours.  No results found for this or any previous visit (from the past 240 hour(s)).     Radiology Studies: No results found.    Scheduled Meds: . buprenorphine-naloxone  1 tablet Sublingual BID  . enoxaparin (LOVENOX) injection  40 mg Subcutaneous Q24H  . sodium chloride flush  3 mL Intravenous Q12H   Continuous Infusions: . sodium chloride 10 mL/hr at 07/02/17 0800  .  ceFAZolin (ANCEF) IV Stopped (07/02/17 2263)     LOS: 41 days    Time spent: 15 minutes   Kayleen Memos, MD [facility] [email or web address] Password [email or web address] 07/02/2017, 8:21 AM

## 2017-07-03 DIAGNOSIS — E876 Hypokalemia: Secondary | ICD-10-CM

## 2017-07-03 DIAGNOSIS — A480 Gas gangrene: Secondary | ICD-10-CM

## 2017-07-03 DIAGNOSIS — L02213 Cutaneous abscess of chest wall: Secondary | ICD-10-CM

## 2017-07-03 DIAGNOSIS — R05 Cough: Secondary | ICD-10-CM

## 2017-07-03 DIAGNOSIS — B182 Chronic viral hepatitis C: Secondary | ICD-10-CM

## 2017-07-03 DIAGNOSIS — D649 Anemia, unspecified: Secondary | ICD-10-CM

## 2017-07-03 DIAGNOSIS — J9811 Atelectasis: Secondary | ICD-10-CM

## 2017-07-03 MED ORDER — AMOXICILLIN-POT CLAVULANATE 875-125 MG PO TABS: 1.0000 | ORAL_TABLET | Freq: Two times a day (BID) | ORAL | 0 refills | Status: AC

## 2017-07-03 MED ORDER — CYCLOBENZAPRINE HCL 5 MG PO TABS: 5.0000 mg | ORAL_TABLET | Freq: Three times a day (TID) | ORAL | 0 refills | Status: DC | PRN

## 2017-07-03 NOTE — Progress Notes (Incomplete Revision)
Nsg Discharge Note  Admit Date:  05/22/2017 Discharge date: 07/03/2017   [facility] order.  AVS completed.  Copy for chart, and copy for patient signed, and dated. Patient/caregiver able to verbalize understanding.  Discharge Medication: Allergies as of 07/03/2017   No Known Allergies     Medication List    TAKE these medications   acetaminophen 325 MG tablet Commonly known as:  TYLENOL Take 2 tablets (650 mg total) by mouth every 6 (six) hours as needed for mild pain (or Fever >/= 101).   amoxicillin-clavulanate 875-125 MG tablet Commonly known as:  AUGMENTIN Take 1 tablet by mouth 2 (two) times daily for 7 days.   cyclobenzaprine 5 MG tablet Commonly known as:  FLEXERIL Take 1 tablet (5 mg total) by mouth 3 (three) times daily as needed for muscle spasms.       Discharge Assessment: Vitals:   07/03/17 0554 07/03/17 0554  BP: 107/63 107/63  Pulse: (!) 104 (!) 104  Resp: 19 15  Temp: 98.1 F (36.7 C) 98.1 F (36.7 C)  SpO2: 99% 100%   Skin clean, dry and intact without evidence of skin break down, no evidence of skin tears noted. IV catheter discontinued intact. Site without signs and symptoms of complications - no redness or edema noted at insertion site, patient denies c/o pain - only slight tenderness at site.  Dressing with slight pressure applied.  D/c Instructions-Education: Discharge instructions given to patient/family with verbalized understanding. D/c education completed with patient/family including follow up instructions, medication list, d/c activities limitations if indicated, with other d/c instructions as indicated by MD - patient able to verbalize understanding, all questions fully answered. Patient instructed to return to ED, call 911, or call MD for any changes in condition.  Patient escorted via WC, and D/C home via private auto with friend and daughter. Will be going to [place] to live with his father.   Camillo FlamingVicki L Bayan Hedstrom,  RN 07/03/2017 1:55 PM

## 2017-07-03 NOTE — Discharge Instructions (Signed)
Bacteremia Bacteremia is the presence of bacteria in the blood. When bacteria enter the bloodstream, they can cause a life-threatening reaction called sepsis, which is a medical emergency. Bacteremia can spread to other parts of the body, including the heart, joints, and brain. What are the causes? This condition is caused by bacteria that get into the blood. Bacteria can enter the blood:  From a skin infection or a cut on your skin.  During an episode of pneumonia.  From an infection in your stomach or intestine (gastrointestinal infection).  From an infection in your bladder or urinary system (urinary tract infection).  During a dental or medical procedure.  After you brush your teeth so hard that your gums bleed.  When a bacterial infection in another part of the body spreads to the blood.  Through a dirty needle.  What increases the risk? This condition is more likely to develop in:  Children.  Elderly adults.  People who have a long-lasting (chronic) disease or medical condition.  People who have an artificial joint or heart valve.  People who have heart valve disease.  People who have a tube, such as a catheter or IV tube, that has been inserted for a medical treatment.  People who have a weak body defense system (immune system).  People who use IV drugs.  What are the signs or symptoms? Symptoms of this condition include:  Fever.  Chills.  A racing heart.  Shortness of breath.  Dizziness.  Weakness.  Confusion.  Nausea or vomiting.  Diarrhea.  In some cases, there are no symptoms. Bacteremia that has spread to the other parts of the body may cause symptoms in those areas. How is this diagnosed? This condition may be diagnosed with a physical exam and tests, such as:  A complete blood count (CBC). This test looks for signs of infection.  Blood cultures. These look for bacteria in your blood.  Tests of any tubes that you may have inserted  into your body, such as an IV tube or urinary catheter. These tests look for a source of infection.  Urine tests including urine cultures. These look for bacteria in the urine that could be a source of infection.  Imaging tests, such as an X-ray, CT scan, MRI, or heart ultrasound. These look for a source of infection in other parts of the body, such as the lungs, heart valves, or joints.  How is this treated? This condition may be treated with:  Antibiotic medicines given through an IV infusion. Depending on the source of infection, antibiotics may be needed for several weeks. At first, an antibiotic may be given to kill most types of blood bacteria. If your test results show that a certain kind of bacteria is causing problems, the antibiotic may be changed to kill only the bacteria that are causing problems.  Antibiotics taken by mouth.  IV fluids to support the body as you fight the infection.  Removing any catheter or device that could be a source of infection.  Blood pressure and breathing support, if you have sepsis.  Surgery to control the source or spread of infection, such as: ? Removing an infected implanted device. ? Removing infected tissue or an abscess.  This condition is usually treated at a hospital. If you are treated at home, you may need to come back for medicines, blood tests, and evaluation. This is important. Follow these instructions at home:  Take over-the-counter and prescription medicines only as told by your health care  provider.  If you were prescribed an antibiotic, take it as told by your health care provider. Do not stop taking the antibiotic even if you start to feel better.  Rest until your condition is under control.  Drink enough fluid to keep your urine clear or pale yellow.  Do not smoke. If you need help quitting, ask your health care provider.  Keep all follow-up visits as told by your health care provider. This is important. How is this  prevented?  Get the vaccinations that your health care provider recommends.  Clean and cover any scrapes or cuts.  Take good care of your skin. This includes regular bathing and moisturizing.  Wash your hands often.  Practice good oral hygiene. Brush your teeth two times a day and floss regularly. Get help right away if:  You have pain.  You have a fever.  You have trouble breathing.  Your skin becomes blotchy, pale, or clammy.  You develop confusion, dizziness, or weakness.  You develop diarrhea.  You develop any new symptoms after treatment. Summary  Bacteremia is the presence of bacteria in the blood. When bacteria enter the bloodstream, they can cause a life- threatening reaction called sepsis.  Children and elderly adults are at increased risk of bacteremia. Other risk factors include having a long-lasting (chronic) disease or a weak immune system, having an artificial joint or heart valve, having heart valve disease, having tubes that were inserted in the body for medical treatment, or using IV drugs.  Some symptoms of bacteremia include fever, chills, shortness of breath, confusion, nausea or vomiting, and diarrhea.  Tests may be done to diagnose a source of infection that led to bacteremia. These tests may include blood tests, urine tests, and imaging tests.  Bacteremia is usually treated with antibiotics, usually in a hospital. This information is not intended to replace advice given to you by your health care provider. Make sure you discuss any questions you have with your health care provider. Document Released: 03/08/2006 Document Revised: 04/21/2016 Document Reviewed: 04/21/2016 Elsevier Interactive Patient Education  2018 [employer]   Skin Abscess A skin abscess is an infected area on or under your skin that contains pus and other material. An abscess can happen almost anywhere on your body. Some abscesses break open (rupture) on their own. Most continue  to get worse unless they are treated. The infection can spread deeper into the body and into your blood, which can make you feel sick. Treatment usually involves draining the abscess. Follow these instructions at home: Abscess Care  If you have an abscess that has not drained, place a warm, clean, wet washcloth over the abscess several times a day. Do this as told by your doctor.  Follow instructions from your doctor about how to take care of your abscess. Make sure you: ? Cover the abscess with a bandage (dressing). ? Change your bandage or gauze as told by your doctor. ? Wash your hands with soap and water before you change the bandage or gauze. If you cannot use soap and water, use hand sanitizer.  Check your abscess every day for signs that the infection is getting worse. Check for: ? More redness, swelling, or pain. ? More fluid or blood. ? Warmth. ? More pus or a bad smell. Medicines   Take over-the-counter and prescription medicines only as told by your doctor.  If you were prescribed an antibiotic medicine, take it as told by your doctor. Do not stop taking the antibiotic even  if you start to feel better. General instructions  To avoid spreading the infection: ? Do not share personal care items, towels, or hot tubs with others. ? Avoid making skin-to-skin contact with other people.  Keep all follow-up visits as told by your doctor. This is important. Contact a doctor if:  You have more redness, swelling, or pain around your abscess.  You have more fluid or blood coming from your abscess.  Your abscess feels warm when you touch it.  You have more pus or a bad smell coming from your abscess.  You have a fever.  Your muscles ache.  You have chills.  You feel sick. Get help right away if:  You have very bad (severe) pain.  You see red streaks on your skin spreading away from the abscess. This information is not intended to replace advice given to you by your  health care provider. Make sure you discuss any questions you have with your health care provider. Document Released: 11/11/2007 Document Revised: 01/19/2016 Document Reviewed: 04/03/2015 Elsevier Interactive Patient Education  [employer]. Please take all of your antibiotics until finished!   Alternate between Tylenol and ibuprofen as needed for pain.   Please call your primary care provider Monday to arrange for a follow up appointment for recheck. If you do not have one, please call the [facility] listed.   Return to ER for new or worsening symptoms, any additional concerns.

## 2017-07-03 NOTE — Progress Notes (Signed)
[facility] NOTE REDACTION - SAME PATIENT   We have changed the demographic information to reflect the correct name of the patient.  This is done in the case a patient gives a false name or in the case of Identity Theft.  In these particular instances; both names are the same patient.  Please review for accuracy and ? Mark Complt the Note Redaction.  Please do not refresh the body of the note.    Even though, there should not be any change in the clinical information due to the name change. You may choose to Edit Note from our Note Redaction.  This will create an Addendum.       Deborah Kelly [employer] Identity Application Analyst [phone]    

## 2017-07-03 NOTE — Discharge Summary (Addendum)
Discharge Summary  Hunter [name] [facility] ZOX:[id number] DOB: [birth year]  PCP: Patient, No Pcp Per  Admit date: 05/22/2017 Discharge date: 07/03/2017  Time spent: 25 minutes    Recommendations for Outpatient Follow-up:  1. Follow up with your PCP within a week 2. Follow up with infectious disease within a week 3. Take your medications as prescribed 4. Abstain from polysubstance use   Discharge Diagnoses:  Active Hospital Problems   Diagnosis Date Noted  . Bacteremia due to methicillin susceptible Staphylococcus aureus (MSSA) 05/23/2017  . Atelectasis of both lungs   . Chest wall abscess   . Psoas abscess (HCC) 05/23/2017  . Discitis of lumbar region 05/22/2017  . Hypokalemia 05/22/2017  . Normocytic anemia 04/28/2017  . Chronic hepatitis C without hepatic coma (HCC) 04/28/2017  . IVDU (intravenous drug user) 04/27/2017    Resolved Hospital Problems  No resolved problems to display.    Discharge Condition: stable  Diet recommendation: resume previous diet  Vitals:   07/03/17 0554 07/03/17 0554  BP: 107/63 107/63  Pulse: (!) 104 (!) 104  Resp: 19 15  Temp: 98.1 F (36.7 C) 98.1 F (36.7 C)  SpO2: 99% 100%    History of present illness:  Hunter [name] is a 32 yo male with history of IV drug use, opioids use disorder, prior hand infection and group A streptococcal bacteremia, status post surgery in November, discharged on Augmentin which he stopped taking. Patient admitted with MSSA bacteremia and sepsis, lumbar disc infection with psoas abscess. Patient treated with 6 weeks course of IV Ancef and completed course on 07/03/2017. Patient has been started on Suboxone on this admission 06/23/17 and the plan is for the patient to follow up with a Clinic in [place] that provides affordable MAT.  Tolerated his treatments well. Day prior to his discharge, reported tenderness at IV site. Afebrile no leukocytosis. Will prescribe 1 week of augmentin.   On the day of discharge the  patient was hemodynamically stable. His pain was well controlled. He had no withdrawal symptoms and cravings were under control.   Patient will need to establish with a suboxone provider in [place] and with a PCP/ID provider also.   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 No epidural abscess. -Blood cultures grew MSSA, repeat blood cultures with no growth to date -TEE did not show any evidence of definitive vegetation -CSW following, not appropriate for discharge home with PICC line given IV drug abuse, stayed inpatient to complete antibiotic course. -ID consulted and now signed off. See last progress note 12/21 for recommendations. Completed ancef for 6 weeks, last day 1/26.   Chest wall abscess -Resolved with antibiotics  IVDA  -Drug use most recently has been on meth/cocaine. No heroin or opiates in a long time.  -Patient planning to eventually move back to [place] to live with his dad -Appreciate Dr. Jacqualin Combes assistance plan to start suboxone, he was provided a list of clinics in [place] that offer MAT that is affordable.   HCV -+AB, Genotype  2b, HCV RNA 6.547, hep c quantitative 3,520,000 -Follow up with ID   Normocytic anemia -Stable, baseline Hgb ~11 -no overt sign of bleeding  Suspected mild right forearm cellulitis -Augmentin 1 tab bid x 7 days -afebrile, no leukocytosis  Chronic low back pain -flexeril as needed -suboxone -follow up with PCP      Procedures:  TEE 12/21, negative for vegetation    Consultations:  ID  Cardiology  IMTS for suboxone    Discharge Exam: BP 107/63 (BP Location: Right Arm)   Pulse (!) 104   Temp 98.1 F (36.7 C) (Oral)   Resp 15   Ht 6\' 1"  (1.854 m)   Wt 81.8 kg (180 lb 5.4 oz)   SpO2 100%   BMI 23.79 kg/m   General: 32 yo CM WD WN NAD A&O x 3 Cardiovascular: RRR no rubs or gallops  Respiratory: CTA no wheezes or rales   Discharge Instructions You were cared for by a hospitalist during your hospital stay. If you have any questions about your discharge medications or the care you received while you were in the hospital after you are discharged, you can call the unit and asked to speak with the hospitalist on call if the hospitalist that took care of you is not available. Once you are discharged, your primary care physician will handle any further medical issues. Please note that NO REFILLS for any discharge medications will be authorized once you are discharged, as it is imperative that you return to your primary care physician (or establish a relationship with a primary care physician if you do not have one) for your aftercare needs so that they can reassess your need for medications and monitor your lab values.   Allergies as of 07/03/2017   No Known Allergies     Medication List    TAKE these medications   acetaminophen 325 MG tablet Commonly known as:  TYLENOL Take 2 tablets (650 mg total) by mouth every 6 (six) hours as needed for mild pain (or Fever >/= 101).   amoxicillin-clavulanate 875-125 MG tablet Commonly known as:  AUGMENTIN Take 1 tablet by mouth 2 (two) times daily for 7 days.   cyclobenzaprine 5 MG tablet Commonly known as:  FLEXERIL Take 1 tablet (5 mg total) by mouth 3 (three) times daily as needed for muscle spasms.      No Known Allergies Follow-up Information    [facility] Follow up.   Contact information: [address] [place] [address] [phone]           The results of significant diagnostics from this hospitalization (including imaging, microbiology, ancillary and laboratory) are listed below for reference.    Significant Diagnostic Studies: Dg Chest 2 View  Result Date: 06/04/2017 CLINICAL DATA:  32 year old male with cough. EXAM: CHEST  2 VIEW COMPARISON:  Chest CT dated 05/22/2017. FINDINGS: Minimal left lung base atelectatic changes. No focal consolidation, pleural effusion, or pneumothorax. The cardiac silhouette is within normal limits. No acute osseous pathology. IMPRESSION: No active cardiopulmonary disease. Electronically Signed   By: Elgie Collard M.D.   On: 06/04/2017 21:46    Microbiology: No results found for this or any previous visit (from the past 240 hour(s)).   Labs: Basic Metabolic Panel: Recent Labs  Lab 07/02/17 0719  NA 137  K 4.0  CL 99*  CO2 26  GLUCOSE 100*  BUN 15  CREATININE 0.78  CALCIUM 9.0   Liver Function Tests:  Recent Labs  Lab 07/02/17 0719  AST 41  ALT 37  ALKPHOS 116  BILITOT 0.5  PROT 8.1  ALBUMIN 3.2*   No results for input(s): LIPASE, AMYLASE in the last 168 hours. No results for input(s): AMMONIA in the last 168 hours. CBC: Recent Labs  Lab 07/02/17 0719  WBC 6.6  HGB 10.3*  HCT 33.6*  MCV 85.1  PLT 212   Cardiac Enzymes: No results for input(s): CKTOTAL, CKMB, CKMBINDEX, TROPONINI in the last 168 hours. BNP: BNP (last 3 results) No results for input(s): BNP in the last 8760 hours.  ProBNP (last 3 results) No results for input(s): PROBNP in the last 8760 hours.  CBG: No results for input(s): GLUCAP in the last 168 hours.     Signed:  Darlin Droparole N Jomari Bartnik, MD [facility] 07/03/2017, 1:19 PM

## 2017-09-21 NOTE — Interval H&P Note (Signed)
History and Physical Interval Note:  09/21/2017 9:17 AM  Hunter [name]  has presented today for surgery, with the diagnosis of BACYEREMIA  The various methods of treatment have been discussed with the patient and family. After consideration of risks, benefits and other options for treatment, the patient has consented to  Procedure(s): TRANSESOPHAGEAL ECHOCARDIOGRAM (TEE) (N/A) as a surgical intervention .  The patient's history has been reviewed, patient examined, no change in status, stable for surgery.  I have reviewed the patient's chart and labs.  Questions were answered to the patient's satisfaction.     Armanda Magicraci Lyberti Thrush

## 2018-02-14 ENCOUNTER — Other Ambulatory Visit: Payer: Self-pay

## 2018-02-14 ENCOUNTER — Encounter (HOSPITAL_COMMUNITY)
Admission: EM | Disposition: A | Discharge status: Home / Self Care | Payer: Self-pay | Source: Home / Self Care | Attending: Internal Medicine

## 2018-02-14 ENCOUNTER — Inpatient Hospital Stay (HOSPITAL_COMMUNITY): Payer: Medicaid - Out of State | Admitting: Anesthesiology

## 2018-02-14 ENCOUNTER — Emergency Department (HOSPITAL_COMMUNITY): Payer: Medicaid - Out of State

## 2018-02-14 ENCOUNTER — Inpatient Hospital Stay (HOSPITAL_COMMUNITY)
Admission: EM | Admit: 2018-02-14 | Discharge: 2018-02-16 | DRG: 581 | Disposition: A | Discharge status: Home / Self Care | Payer: Medicaid - Out of State | Attending: Internal Medicine | Admitting: Internal Medicine

## 2018-02-14 ENCOUNTER — Encounter (HOSPITAL_COMMUNITY): Payer: Self-pay | Admitting: Emergency Medicine

## 2018-02-14 DIAGNOSIS — L02511 Cutaneous abscess of right hand: Secondary | ICD-10-CM | POA: Diagnosis present

## 2018-02-14 DIAGNOSIS — Z8739 Personal history of other diseases of the musculoskeletal system and connective tissue: Secondary | ICD-10-CM

## 2018-02-14 DIAGNOSIS — L03011 Cellulitis of right finger: Secondary | ICD-10-CM | POA: Diagnosis present

## 2018-02-14 DIAGNOSIS — Z8619 Personal history of other infectious and parasitic diseases: Secondary | ICD-10-CM

## 2018-02-14 DIAGNOSIS — F17210 Nicotine dependence, cigarettes, uncomplicated: Secondary | ICD-10-CM | POA: Diagnosis present

## 2018-02-14 DIAGNOSIS — A419 Sepsis, unspecified organism: Secondary | ICD-10-CM

## 2018-02-14 DIAGNOSIS — D638 Anemia in other chronic diseases classified elsewhere: Secondary | ICD-10-CM | POA: Diagnosis present

## 2018-02-14 DIAGNOSIS — F952 Tourette's disorder: Secondary | ICD-10-CM

## 2018-02-14 DIAGNOSIS — Z8659 Personal history of other mental and behavioral disorders: Secondary | ICD-10-CM

## 2018-02-14 DIAGNOSIS — B182 Chronic viral hepatitis C: Secondary | ICD-10-CM | POA: Diagnosis present

## 2018-02-14 DIAGNOSIS — L818 Other specified disorders of pigmentation: Secondary | ICD-10-CM

## 2018-02-14 DIAGNOSIS — D649 Anemia, unspecified: Secondary | ICD-10-CM

## 2018-02-14 DIAGNOSIS — L02519 Cutaneous abscess of unspecified hand: Secondary | ICD-10-CM | POA: Diagnosis present

## 2018-02-14 DIAGNOSIS — L03113 Cellulitis of right upper limb: Secondary | ICD-10-CM

## 2018-02-14 DIAGNOSIS — Z872 Personal history of diseases of the skin and subcutaneous tissue: Secondary | ICD-10-CM

## 2018-02-14 DIAGNOSIS — L03119 Cellulitis of unspecified part of limb: Secondary | ICD-10-CM | POA: Diagnosis present

## 2018-02-14 HISTORY — PX: I&D EXTREMITY: SHX5045

## 2018-02-14 LAB — CBC WITH DIFFERENTIAL/PLATELET
ABS IMMATURE GRANULOCYTES: 0.1 10*3/uL (ref 0.0–0.1)
BASOS PCT: 0 %
Basophils Absolute: 0 10*3/uL (ref 0.0–0.1)
Eosinophils Absolute: 0.1 10*3/uL (ref 0.0–0.7)
Eosinophils Relative: 1 %
HCT: 35.2 % — ABNORMAL LOW (ref 39.0–52.0)
Hemoglobin: 11.1 g/dL — ABNORMAL LOW (ref 13.0–17.0)
IMMATURE GRANULOCYTES: 1 %
Lymphocytes Relative: 14 %
Lymphs Abs: 2 10*3/uL (ref 0.7–4.0)
MCH: 28.8 pg (ref 26.0–34.0)
MCHC: 31.5 g/dL (ref 30.0–36.0)
MCV: 91.2 fL (ref 78.0–100.0)
MONO ABS: 1.7 10*3/uL — AB (ref 0.1–1.0)
Monocytes Relative: 12 %
NEUTROS ABS: 10.2 10*3/uL — AB (ref 1.7–7.7)
NEUTROS PCT: 72 %
Platelets: 282 10*3/uL (ref 150–400)
RBC: 3.86 MIL/uL — ABNORMAL LOW (ref 4.22–5.81)
RDW: 15.5 % (ref 11.5–15.5)
WBC: 14 10*3/uL — AB (ref 4.0–10.5)

## 2018-02-14 LAB — CBC
HCT: 31.9 % — ABNORMAL LOW (ref 39.0–52.0)
Hemoglobin: 10.2 g/dL — ABNORMAL LOW (ref 13.0–17.0)
MCH: 29.1 pg (ref 26.0–34.0)
MCHC: 32 g/dL (ref 30.0–36.0)
MCV: 90.9 fL (ref 78.0–100.0)
PLATELETS: 216 10*3/uL (ref 150–400)
RBC: 3.51 MIL/uL — ABNORMAL LOW (ref 4.22–5.81)
RDW: 15.5 % (ref 11.5–15.5)
WBC: 12.7 10*3/uL — AB (ref 4.0–10.5)

## 2018-02-14 LAB — I-STAT CG4 LACTIC ACID, ED
LACTIC ACID, VENOUS: 1.21 mmol/L (ref 0.5–1.9)
Lactic Acid, Venous: 0.89 mmol/L (ref 0.5–1.9)

## 2018-02-14 LAB — CREATININE, SERUM
Creatinine, Ser: 0.68 mg/dL (ref 0.61–1.24)
GFR calc non Af Amer: >60 mL/min (ref >60)

## 2018-02-14 LAB — COMPREHENSIVE METABOLIC PANEL
ALK PHOS: 161 U/L — AB (ref 38–126)
ALT: 30 U/L (ref 0–44)
ANION GAP: 10 (ref 5–15)
AST: 25 U/L (ref 15–41)
Albumin: 3.6 g/dL (ref 3.5–5.0)
BILIRUBIN TOTAL: 1.3 mg/dL — AB (ref 0.3–1.2)
BUN: 7 mg/dL (ref 6–20)
CALCIUM: 9 mg/dL (ref 8.9–10.3)
CO2: 27 mmol/L (ref 22–32)
Chloride: 98 mmol/L (ref 98–111)
Creatinine, Ser: 0.79 mg/dL (ref 0.61–1.24)
Glucose, Bld: 127 mg/dL — ABNORMAL HIGH (ref 70–99)
POTASSIUM: 3.6 mmol/L (ref 3.5–5.1)
Sodium: 135 mmol/L (ref 135–145)
Total Protein: 8.4 g/dL — ABNORMAL HIGH (ref 6.5–8.1)

## 2018-02-14 SURGERY — IRRIGATION AND DEBRIDEMENT EXTREMITY
Anesthesia: General | Site: Finger | Laterality: Right

## 2018-02-14 MED ORDER — PROMETHAZINE HCL 25 MG/ML IJ SOLN: 6.2500 mg | INTRAMUSCULAR | Status: DC | PRN

## 2018-02-14 MED ORDER — FENTANYL CITRATE (PF) 250 MCG/5ML IJ SOLN
INTRAMUSCULAR | Status: AC
  Filled 2018-02-14: qty 5

## 2018-02-14 MED ORDER — HYDROMORPHONE HCL 1 MG/ML IJ SOLN
0.2500 mg | INTRAMUSCULAR | Status: DC | PRN
  Administered 2018-02-14: 0.5 mg via INTRAVENOUS

## 2018-02-14 MED ORDER — SODIUM CHLORIDE 0.9 % IR SOLN
Status: DC | PRN
  Administered 2018-02-14: 3000 mL

## 2018-02-14 MED ORDER — LACTATED RINGERS IV SOLN
INTRAVENOUS | Status: DC
  Administered 2018-02-14: 19:00:00 via INTRAVENOUS

## 2018-02-14 MED ORDER — LIDOCAINE 2% (20 MG/ML) 5 ML SYRINGE
INTRAMUSCULAR | Status: AC
  Filled 2018-02-14: qty 5

## 2018-02-14 MED ORDER — CHLORHEXIDINE GLUCONATE 4 % EX LIQD
60.0000 mL | Freq: Once | CUTANEOUS | Status: DC
  Filled 2018-02-14: qty 60

## 2018-02-14 MED ORDER — VANCOMYCIN HCL IN DEXTROSE 1-5 GM/200ML-% IV SOLN
1000.0000 mg | Freq: Once | INTRAVENOUS | Status: DC
  Filled 2018-02-14: qty 200

## 2018-02-14 MED ORDER — MIDAZOLAM HCL 5 MG/5ML IJ SOLN
INTRAMUSCULAR | Status: DC | PRN
  Administered 2018-02-14 (×2): 1 mg via INTRAVENOUS

## 2018-02-14 MED ORDER — ONDANSETRON HCL 4 MG/2ML IJ SOLN
INTRAMUSCULAR | Status: DC | PRN
  Administered 2018-02-14: 4 mg via INTRAVENOUS

## 2018-02-14 MED ORDER — POVIDONE-IODINE 10 % EX SWAB: 2.0000 "application " | Freq: Once | CUTANEOUS | Status: DC

## 2018-02-14 MED ORDER — FENTANYL CITRATE (PF) 100 MCG/2ML IJ SOLN
50.0000 ug | Freq: Once | INTRAMUSCULAR | Status: AC
  Administered 2018-02-14: 50 ug via INTRAVENOUS

## 2018-02-14 MED ORDER — ACETAMINOPHEN 325 MG PO TABS: 650.0000 mg | ORAL_TABLET | Freq: Four times a day (QID) | ORAL | Status: DC | PRN

## 2018-02-14 MED ORDER — OXYCODONE HCL 5 MG/5ML PO SOLN: 5.0000 mg | Freq: Once | ORAL | Status: DC | PRN

## 2018-02-14 MED ORDER — ONDANSETRON HCL 4 MG PO TABS: 4.0000 mg | ORAL_TABLET | Freq: Four times a day (QID) | ORAL | Status: DC | PRN

## 2018-02-14 MED ORDER — 0.9 % SODIUM CHLORIDE (POUR BTL) OPTIME
TOPICAL | Status: DC | PRN
  Administered 2018-02-14: 1000 mL

## 2018-02-14 MED ORDER — PROPOFOL 10 MG/ML IV BOLUS
INTRAVENOUS | Status: DC | PRN
  Administered 2018-02-14: 200 mg via INTRAVENOUS

## 2018-02-14 MED ORDER — LIDOCAINE HCL (PF) 1 % IJ SOLN
INTRAMUSCULAR | Status: AC
  Filled 2018-02-14: qty 30

## 2018-02-14 MED ORDER — CEFAZOLIN SODIUM-DEXTROSE 2-4 GM/100ML-% IV SOLN
2.0000 g | INTRAVENOUS | Status: AC
  Administered 2018-02-14: 2 g via INTRAVENOUS
  Filled 2018-02-14: qty 100

## 2018-02-14 MED ORDER — ONDANSETRON HCL 4 MG/2ML IJ SOLN
INTRAMUSCULAR | Status: AC
  Filled 2018-02-14: qty 2

## 2018-02-14 MED ORDER — VANCOMYCIN HCL IN DEXTROSE 1-5 GM/200ML-% IV SOLN
1000.0000 mg | Freq: Three times a day (TID) | INTRAVENOUS | Status: DC
  Filled 2018-02-14 (×2): qty 200

## 2018-02-14 MED ORDER — ACETAMINOPHEN 500 MG PO TABS
1000.0000 mg | ORAL_TABLET | Freq: Once | ORAL | Status: AC
  Administered 2018-02-14: 1000 mg via ORAL
  Filled 2018-02-14: qty 2

## 2018-02-14 MED ORDER — LACTATED RINGERS IV SOLN
INTRAVENOUS | Status: DC | PRN
  Administered 2018-02-14: 21:00:00 via INTRAVENOUS

## 2018-02-14 MED ORDER — MIDAZOLAM HCL 2 MG/2ML IJ SOLN
INTRAMUSCULAR | Status: AC
  Filled 2018-02-14: qty 2

## 2018-02-14 MED ORDER — FENTANYL CITRATE (PF) 100 MCG/2ML IJ SOLN
INTRAMUSCULAR | Status: AC
  Administered 2018-02-14: 50 ug via INTRAVENOUS
  Filled 2018-02-14: qty 2

## 2018-02-14 MED ORDER — LIDOCAINE 2% (20 MG/ML) 5 ML SYRINGE
INTRAMUSCULAR | Status: DC | PRN
  Administered 2018-02-14: 100 mg via INTRAVENOUS

## 2018-02-14 MED ORDER — HEPARIN SODIUM (PORCINE) 5000 UNIT/ML IJ SOLN: 5000.0000 [IU] | Freq: Three times a day (TID) | INTRAMUSCULAR | Status: DC

## 2018-02-14 MED ORDER — BUPIVACAINE-EPINEPHRINE (PF) 0.5% -1:200000 IJ SOLN
INTRAMUSCULAR | Status: AC
  Filled 2018-02-14: qty 30

## 2018-02-14 MED ORDER — POLYETHYLENE GLYCOL 3350 17 G PO PACK: 17.0000 g | PACK | Freq: Every day | ORAL | Status: DC | PRN

## 2018-02-14 MED ORDER — SODIUM CHLORIDE 0.9 % IV SOLN
2.0000 g | INTRAVENOUS | Status: DC
  Administered 2018-02-14: 2 g via INTRAVENOUS
  Filled 2018-02-14: qty 20

## 2018-02-14 MED ORDER — ACETAMINOPHEN 650 MG RE SUPP: 650.0000 mg | Freq: Four times a day (QID) | RECTAL | Status: DC | PRN

## 2018-02-14 MED ORDER — PROPOFOL 10 MG/ML IV BOLUS
INTRAVENOUS | Status: AC
  Filled 2018-02-14: qty 20

## 2018-02-14 MED ORDER — ONDANSETRON HCL 4 MG/2ML IJ SOLN: 4.0000 mg | Freq: Four times a day (QID) | INTRAMUSCULAR | Status: DC | PRN

## 2018-02-14 MED ORDER — OXYCODONE HCL 5 MG PO TABS: 5.0000 mg | ORAL_TABLET | Freq: Once | ORAL | Status: DC | PRN

## 2018-02-14 MED ORDER — MEPERIDINE HCL 50 MG/ML IJ SOLN: 6.2500 mg | INTRAMUSCULAR | Status: DC | PRN

## 2018-02-14 MED ORDER — FENTANYL CITRATE (PF) 100 MCG/2ML IJ SOLN
INTRAMUSCULAR | Status: DC | PRN
  Administered 2018-02-14 (×4): 50 ug via INTRAVENOUS

## 2018-02-14 MED ORDER — HYDROMORPHONE HCL 1 MG/ML IJ SOLN
INTRAMUSCULAR | Status: AC
  Administered 2018-02-14: 0.5 mg via INTRAVENOUS
  Filled 2018-02-14: qty 1

## 2018-02-14 SURGICAL SUPPLY — 52 items
BANDAGE COBAN STERILE 2 (GAUZE/BANDAGES/DRESSINGS) IMPLANT
BLADE MINI RND TIP GREEN BEAV (BLADE) IMPLANT
BLADE SURG 15 STRL LF DISP TIS (BLADE) ×1 IMPLANT
BLADE SURG 15 STRL SS (BLADE) ×2
BNDG COHESIVE 4X5 TAN STRL (GAUZE/BANDAGES/DRESSINGS) ×3 IMPLANT
BNDG ESMARK 4X9 LF (GAUZE/BANDAGES/DRESSINGS) ×3 IMPLANT
BNDG GAUZE ELAST 4 BULKY (GAUZE/BANDAGES/DRESSINGS) ×3 IMPLANT
CHLORAPREP W/TINT 26ML (MISCELLANEOUS) ×3 IMPLANT
CORDS BIPOLAR (ELECTRODE) ×3 IMPLANT
COVER BACK TABLE 60X90IN (DRAPES) ×3 IMPLANT
COVER MAYO STAND STRL (DRAPES) IMPLANT
CUFF TOURNIQUET SINGLE 18IN (TOURNIQUET CUFF) ×3 IMPLANT
DRAPE HALF SHEET 40X57 (DRAPES) ×3 IMPLANT
DRAPE SURG 17X23 STRL (DRAPES) ×3 IMPLANT
DRSG EMULSION OIL 3X3 NADH (GAUZE/BANDAGES/DRESSINGS) IMPLANT
GAUZE SPONGE 4X4 12PLY STRL (GAUZE/BANDAGES/DRESSINGS) ×3 IMPLANT
GAUZE SPONGE 4X4 16PLY XRAY LF (GAUZE/BANDAGES/DRESSINGS) ×3 IMPLANT
GLOVE BIO SURGEON STRL SZ7.5 (GLOVE) ×3 IMPLANT
GLOVE BIOGEL PI IND STRL 7.0 (GLOVE) ×1 IMPLANT
GLOVE BIOGEL PI IND STRL 8 (GLOVE) ×1 IMPLANT
GLOVE BIOGEL PI INDICATOR 7.0 (GLOVE) ×2
GLOVE BIOGEL PI INDICATOR 8 (GLOVE) ×2
GLOVE ECLIPSE 6.5 STRL STRAW (GLOVE) ×3 IMPLANT
GOWN STRL REUS W/ TWL LRG LVL3 (GOWN DISPOSABLE) ×2 IMPLANT
GOWN STRL REUS W/TWL LRG LVL3 (GOWN DISPOSABLE) ×4
GOWN STRL REUS W/TWL XL LVL3 (GOWN DISPOSABLE) ×3 IMPLANT
NEEDLE HYPO 25X1 1.5 SAFETY (NEEDLE) IMPLANT
NS IRRIG 1000ML POUR BTL (IV SOLUTION) ×3 IMPLANT
PACK BASIN DAY SURGERY FS (CUSTOM PROCEDURE TRAY) ×3 IMPLANT
PAD CAST 4YDX4 CTTN HI CHSV (CAST SUPPLIES) ×1 IMPLANT
PADDING CAST ABS 4INX4YD NS (CAST SUPPLIES) ×2
PADDING CAST ABS COTTON 4X4 ST (CAST SUPPLIES) ×1 IMPLANT
PADDING CAST COTTON 4X4 STRL (CAST SUPPLIES) ×2
RUBBERBAND STERILE (MISCELLANEOUS) IMPLANT
SET IRRIG Y TYPE TUR BLADDER L (SET/KITS/TRAYS/PACK) IMPLANT
SPLINT PLASTER EXTRA FAST 3X15 (CAST SUPPLIES) ×2
SPLINT PLASTER GYPS XFAST 3X15 (CAST SUPPLIES) ×1 IMPLANT
SPONGE LAP 18X18 X RAY DECT (DISPOSABLE) ×3 IMPLANT
STOCKINETTE 6  STRL (DRAPES) ×2
STOCKINETTE 6 STRL (DRAPES) ×1 IMPLANT
SUT VICRYL RAPIDE 4-0 (SUTURE) IMPLANT
SUT VICRYL RAPIDE 4/0 PS 2 (SUTURE) IMPLANT
SWAB CULTURE LIQ STUART DBL (MISCELLANEOUS) ×3 IMPLANT
SYR 10ML LL (SYRINGE) IMPLANT
SYR BULB 3OZ (MISCELLANEOUS) ×3 IMPLANT
TOWEL OR 17X24 6PK STRL BLUE (TOWEL DISPOSABLE) ×3 IMPLANT
TOWEL OR NON WOVEN STRL DISP B (DISPOSABLE) ×3 IMPLANT
TUBE ANAEROBIC SPECIMEN COL (MISCELLANEOUS) ×3 IMPLANT
TUBE CONNECTING 12'X1/4 (SUCTIONS) ×1
TUBE CONNECTING 12X1/4 (SUCTIONS) ×2 IMPLANT
TUBING CYSTO DISP (UROLOGICAL SUPPLIES) ×3 IMPLANT
UNDERPAD 30X30 (UNDERPADS AND DIAPERS) ×3 IMPLANT

## 2018-02-14 NOTE — Op Note (Signed)
02/14/2018  7:42 PM  PATIENT:  Hunter [name]  32 y.o. male  PRE-OPERATIVE DIAGNOSIS:  R IF / hand suppurative deep infection  POST-OPERATIVE DIAGNOSIS: Right index finger volar subcutaneous abscess with extension to the flexor tendon sheath in the middle and distal aspect of the finger  PROCEDURE:  1. Right index finger excisional debridement of skin and subcutaneous tissues, 1 square centimeter    2.  Right index finger drainage and irrigation of flexor tendon sheath, as well as subcutaneous space  SURGEON: Cliffton Asters. Janee Morn, MD  PHYSICIAN ASSISTANT: None  ANESTHESIA:  general  SPECIMENS:  Swabs to micro  DRAINS:   None  EBL:  less than 50 mL  PREOPERATIVE INDICATIONS:  Hunter [name] is a  32 y.o. male with a deep infection of the R IF/hand  The risks benefits and alternatives were discussed with the patient preoperatively including but not limited to the risks of infection, bleeding, nerve injury, cardiopulmonary complications, the need for revision surgery, among others, and the patient verbalized understanding and consented to proceed.  OPERATIVE IMPLANTS: none  OPERATIVE PROCEDURE:  After receiving antibiotics in the ED, the patient was escorted to the operative theatre and placed in a supine position.  General anesthesia was administered.  A surgical "time-out" was performed during which the planned procedure, proposed operative site, and the correct patient identity were compared to the operative consent and agreement confirmed by the circulating nurse according to current facility policy.  Following application of a tourniquet to the operative extremity, the exposed skin was prepped with Chloraprep and draped in the usual sterile fashion.  The limb was exsanguinated with gravity and the tourniquet inflated to approximately higher than systolic BP.  A radial mid axial incision was made sharply, elevating a full-thickness volar flap, protecting and preserving  the digital neurovascular bundles.  Gross purulence was encountered in the subcutaneous space deep to the volar wounds, at the level of the DIP flexion crease.  There were no foreign bodies.  Cultures were obtained.  The pus was evacuated and copiously irrigated.  The tubal the wounds were excisionally debrided, removing nonviable desquamating epidermis and deep/adjacent dermis and some subcutaneous tissue as well.  This was done with scissors and forceps.  There was some fluid distally within the flexor tendon sheath, and in fact the tendons could be seen naked at the level of the A5 pulley, which appeared not to be present.  I made an opening in the region of the C1 A2 C3 window, and I flushed the tendon sheath from proximal to distal.  There was egress of fluid distally at the level of the missing a 5 pulley, and I flushed until it was clear.  This was done with gravity assisted irrigation using cystoscopy tubing.  3 L of irrigant were run through the open wound.  The tourniquet was released, some additional hemostasis obtained, and I elected to leave the wound open, with moist gauze packing within it.  A bulky hand dressing with a volar plaster wrist component was applied and he was taken to the recovery room in stable condition, breathing spontaneously.  DISPOSITION: He will be transferred to the floor for further care, likely returning to the operating room on Wednesday for secondary washout and wound closure.

## 2018-02-14 NOTE — Anesthesia Procedure Notes (Signed)
Procedure Name: LMA Insertion Date/Time: 02/14/2018 9:14 PM Performed by: Edmonia Caprio, CRNA Pre-anesthesia Checklist: Patient identified, Emergency Drugs available, Suction available, Patient being monitored and Timeout performed Patient Re-evaluated:Patient Re-evaluated prior to induction Oxygen Delivery Method: Circle system utilized Preoxygenation: Pre-oxygenation with 100% oxygen Induction Type: IV induction Ventilation: Mask ventilation without difficulty LMA: LMA inserted LMA Size: 5.0 Tube type: Oral Number of attempts: 1 Placement Confirmation: ETT inserted through vocal cords under direct vision,  positive ETCO2 and breath sounds checked- equal and bilateral Tube secured with: Tape Dental Injury: Teeth and Oropharynx as per pre-operative assessment

## 2018-02-14 NOTE — ED Notes (Signed)
Informed Abigail - PA and Chelsea - RN of pt's temp and BP.

## 2018-02-14 NOTE — ED Provider Notes (Signed)
[facility] EMERGENCY DEPARTMENT Provider Note   CSN: [id number] Arrival date & time: 02/14/18  1230     History   Chief Complaint Chief Complaint  Patient presents with  . Hand Problem    HPI [name] is a 32 y.o. male.  Patient is a 32 year old male with a history of hepatitis C, prior history of IV drug abuse but states he has not used for a while and prior history of abscesses presenting today with 2 days of worsening swelling and pain in his right index finger.  Patient states about 1 week ago he was in the woods clearing brush and got thorns stuck in his finger.  He does not think he was able to get all of them out and it was hurting but in the last 2 days it has become markedly swollen, red and painful.  He denies any significant drainage.  He has not noted any fever but was noted to be febrile in triage.  Patient takes medication for Tourette's but states he is currently taking no other medications.  He does admit to taking a Lortab yesterday when the pain started worsening.  Tetanus shot is up-to-date.  He denies any chest pain, shortness of breath.  The history is provided by the patient.    Past Medical History:  Diagnosis Date  . Chronic hepatitis C without hepatic coma (HCC) 04/28/2017  . Hepatitis-C   . IVDU (intravenous drug user)   . Tourette's     Patient Active Problem List   Diagnosis Date Noted  . Atelectasis of both lungs   . Chest wall abscess   . Psoas abscess (HCC) 05/23/2017  . Bacteremia due to methicillin susceptible Staphylococcus aureus (MSSA) 05/23/2017  . Discitis of lumbar region 05/22/2017  . Hypokalemia 05/22/2017  . Normocytic anemia 04/28/2017  . Chronic hepatitis C without hepatic coma (HCC) 04/28/2017  . IVDU (intravenous drug user) 04/27/2017    Past Surgical History:  Procedure Laterality Date  . I&D EXTREMITY Right 04/26/2017   Procedure: IRRIGATION AND DEBRIDEMENT RIGHT HAND AND FOREARM;  Surgeon:  Bradly Bienenstock, MD;  Location: [facility] OR;  Service: Orthopedics;  Laterality: Right;  . TEE WITHOUT CARDIOVERSION N/A 04/30/2017   Procedure: TRANSESOPHAGEAL ECHOCARDIOGRAM (TEE);  Surgeon: Quintella Reichert, MD;  Location: [facility] ENDOSCOPY;  Service: Cardiovascular;  Laterality: N/A;  . TEE WITHOUT CARDIOVERSION N/A 05/28/2017   Procedure: TRANSESOPHAGEAL ECHOCARDIOGRAM (TEE);  Surgeon: Pricilla Riffle, MD;  Location: [facility] ENDOSCOPY;  Service: Cardiovascular;  Laterality: N/A;  . TONSILLECTOMY          Home Medications    Prior to Admission medications   Medication Sig Start Date End Date Taking? Authorizing Provider  acetaminophen (TYLENOL) 325 MG tablet Take 2 tablets (650 mg total) by mouth every 6 (six) hours as needed for mild pain (or Fever >/= 101). Patient not taking: Reported on 05/22/2017 05/01/17   Standley Brooking, MD  cyclobenzaprine (FLEXERIL) 5 MG tablet Take 1 tablet (5 mg total) by mouth 3 (three) times daily as needed for muscle spasms. 07/03/17   Darlin Drop, DO    Family History No family history on file.  Social History Social History   Tobacco Use  . Smoking status: Current Every Day Smoker    Types: Cigarettes  . Smokeless tobacco: Never Used  Substance Use Topics  . Alcohol use: No    Frequency: Never  . Drug use: Yes    Types: IV    Comment: heroin  Allergies   Patient has no known allergies.   Review of Systems Review of Systems  All other systems reviewed and are negative.    Physical Exam Updated Vital Signs BP (!) 148/110 (BP Location: Left Arm)   Pulse (!) 110   Temp (!) 100.6 F (38.1 C) (Oral)   Resp 18   Ht 6\' 1"  (1.854 m)   Wt 81 kg   SpO2 100%   BMI 23.56 kg/m   Physical Exam  Constitutional: He is oriented to person, place, and time. He appears well-developed and well-nourished. No distress.  HENT:  Head: Normocephalic and atraumatic.  Mouth/Throat: Oropharynx is clear and moist.  Eyes: Pupils are equal, round, and  reactive to light. Conjunctivae and EOM are normal.  Neck: Normal range of motion. Neck supple.  Cardiovascular: Regular rhythm and intact distal pulses. Tachycardia present.  Murmur heard.  Systolic murmur is present with a grade of 2/6. Pulmonary/Chest: Effort normal and breath sounds normal. No respiratory distress. He has no wheezes. He has no rales.  Musculoskeletal: Normal range of motion. He exhibits edema and tenderness.  See picture in PA note.  Significantly swollen right index finger with warmth and erythema.  Unable to completely extend and flex the finger due to swelling and pain.  Ecchymosis present on the pad of the right index finger as well as purulence over the DIP joint but no drainage.  Mild tracking of erythema of the palmar aspect of the forearm.  Right hand is significantly more swollen compared to the left hand.  2+ radial pulse palpated.  Neurological: He is alert and oriented to person, place, and time.  Skin: Skin is warm and dry. No rash noted. No erythema.  Psychiatric: He has a normal mood and affect. His behavior is normal.  Nursing note and vitals reviewed.    ED Treatments / Results  Labs (all labs ordered are listed, but only abnormal results are displayed) Labs Reviewed  COMPREHENSIVE METABOLIC PANEL - Abnormal; Notable for the following components:      Result Value   Glucose, Bld 127 (*)    Total Protein 8.4 (*)    Alkaline Phosphatase 161 (*)    Total Bilirubin 1.3 (*)    All other components within normal limits  CBC WITH DIFFERENTIAL/PLATELET - Abnormal; Notable for the following components:   WBC 14.0 (*)    RBC 3.86 (*)    Hemoglobin 11.1 (*)    HCT 35.2 (*)    Neutro Abs 10.2 (*)    Monocytes Absolute 1.7 (*)    All other components within normal limits  CULTURE, BLOOD (ROUTINE X 2)  CULTURE, BLOOD (ROUTINE X 2)  CULTURE, BLOOD (SINGLE)  URINALYSIS, ROUTINE W REFLEX MICROSCOPIC  I-STAT CG4 LACTIC ACID, ED  I-STAT CG4 LACTIC ACID, ED    I-STAT CG4 LACTIC ACID, ED  I-STAT CG4 LACTIC ACID, ED    EKG EKG Interpretation  Date/Time:  Monday February 14 2018 13:30:09 EDT Ventricular Rate:  102 PR Interval:  134 QRS Duration: 88 QT Interval:  334 QTC Calculation: 435 R Axis:   67 Text Interpretation:  Sinus tachycardia Otherwise normal ECG No previous tracing Confirmed by Gwyneth Sprout ([id number]) on 02/14/2018 3:14:56 PM   Radiology No results found.  Procedures Procedures (including critical care time)  Medications Ordered in ED Medications  vancomycin (VANCOCIN) IVPB 1000 mg/200 mL premix (has no administration in time range)  cefTRIAXone (ROCEPHIN) 2 g in sodium chloride 0.9 % 100 mL IVPB (has no  administration in time range)  vancomycin (VANCOCIN) IVPB 1000 mg/200 mL premix (has no administration in time range)  acetaminophen (TYLENOL) tablet 1,000 mg (1,000 mg Oral Given 02/14/18 1328)     Initial Impression / Assessment and Plan / ED Course  I have reviewed the triage vital signs and the nursing notes.  Pertinent labs & imaging results that were available during my care of the patient were reviewed by me and considered in my medical decision making (see chart for details).     Patient presenting today with symptoms concerning for tenosynovitis and hand infection possibly from retained thorn.  Patient is unable to fully flex and extend the finger due to significant erythema and swelling.  Patient is febrile here to 100.6 and a sepsis work-up was initiated when being seen in triage.  Patient does have a prior history of IV drug abuse and did have a systolic murmur noted.  He is denying any systemic symptoms at this time.  He states he is no longer using IV drugs.  His tetanus shot is up-to-date.  He does have a history of hepatitis C but no prior history of HIV. Vancomycin and Rocephin have been ordered.  Spoke with Dale Rye with hand surgery who will come and evaluate the patient.  He is currently  hemodynamically stable.  Labs significant for leukocytosis of 14,000 but otherwise normal lactate.  Spoke with hospitalist for admission and hand surgery will take patient to the operating room. Final Clinical Impressions(s) / ED Diagnoses   Final diagnoses:  Cellulitis of right hand  Abscess of right index finger  Sepsis, due to unspecified organism [facility])    ED Discharge Orders    None       Gwyneth Sprout, MD 02/14/18 1621

## 2018-02-14 NOTE — ED Notes (Signed)
Patient transported to X-ray 

## 2018-02-14 NOTE — Transfer of Care (Signed)
Immediate Anesthesia Transfer of Care Note  Patient: Paulus Betker [facility]  Procedure(s) Performed: IRRIGATION AND DEBRIDEMENT RIGHT INDEX FINGER  (Right Finger)  Patient Location: PACU  Anesthesia Type:General  Level of Consciousness: awake, alert  and oriented  Airway & Oxygen Therapy: Patient Spontanous Breathing  Post-op Assessment: Report given to RN and Post -op Vital signs reviewed and stable  Post vital signs: Reviewed and stable  Last Vitals:  Vitals Value Taken Time  BP 123/84 02/14/2018 10:01 PM  Temp    Pulse 84 02/14/2018 10:03 PM  Resp 13 02/14/2018 10:03 PM  SpO2 98 % 02/14/2018 10:03 PM  Vitals shown include unvalidated device data.  Last Pain:  Vitals:   02/14/18 1954  TempSrc:   PainSc: 9          Complications: No apparent anesthesia complications

## 2018-02-14 NOTE — Anesthesia Preprocedure Evaluation (Addendum)
Anesthesia Evaluation  Patient identified by MRN, date of birth, ID band Patient awake    Reviewed: Allergy & Precautions, NPO status , Patient's Chart, lab work & pertinent test results  History of Anesthesia Complications Negative for: history of anesthetic complications  Airway Mallampati: II  TM Distance: >3 FB Neck ROM: Full    Dental  (+) Dental Advisory Given   Pulmonary neg pulmonary ROS, Current Smoker,    breath sounds clear to auscultation       Cardiovascular  Rhythm:Regular Rate:Normal  05/24/17 ECHO: EF 50-55%, valves OK   Neuro/Psych negative neurological ROS     GI/Hepatic negative GI ROS, (+)     substance abuse  IV drug use, Hepatitis -, C  Endo/Other  negative endocrine ROS  Renal/GU negative Renal ROS     Musculoskeletal  (+) Arthritis ,   Abdominal   Peds  Hematology  (+) Blood dyscrasia (Hb 10.0), anemia ,   Anesthesia Other Findings   Reproductive/Obstetrics                             Anesthesia Physical  Anesthesia Plan  ASA: III  Anesthesia Plan: General   Post-op Pain Management:    Induction:   PONV Risk Score and Plan: 2 and Treatment may vary due to age or medical condition, Ondansetron and Midazolam  Airway Management Planned: LMA  Additional Equipment:   Intra-op Plan:   Post-operative Plan: Extubation in OR  Informed Consent: I have reviewed the patients History and Physical, chart, labs and discussed the procedure including the risks, benefits and alternatives for the proposed anesthesia with the patient or authorized representative who has indicated his/her understanding and acceptance.   Dental advisory given  Plan Discussed with: CRNA  Anesthesia Plan Comments:       Anesthesia Quick Evaluation

## 2018-02-14 NOTE — ED Triage Notes (Signed)
Pt states he was in a thorn bush about 1 week ago, got multiple thorns in right hand, noticed hand swelling 2 days ago, index finger very red and edematous, pt febrile in triage.

## 2018-02-14 NOTE — ED Notes (Signed)
PA Harris in to see patient at this time

## 2018-02-14 NOTE — Progress Notes (Signed)
R IF opened in OR, and left open with packing. Will plan to return to OR, likely on Wednesday, depending upon OR availability, to re-assess and hopefully close the wound.  Neil Crouch, MD Hand Surgery

## 2018-02-14 NOTE — Progress Notes (Signed)
Pharmacy Antibiotic Note  Hunter [name] is a 32 y.o. male admitted on 02/14/2018 with sepsis.    Plan: Ceftriaxone 2 g q24h Vanc 1 g x 1 q8 Monitor renal fx cx vt prn  Height: 6\' 1"  (185.4 cm) Weight: 178 lb 9.2 oz (81 kg) IBW/kg (Calculated) : 79.9  Temp (24hrs), Avg:100.6 F (38.1 C), Min:100.6 F (38.1 C), Max:100.6 F (38.1 C)  Recent Labs  Lab 02/14/18 1331  LATICACIDVEN 0.89    CrCl cannot be calculated (Patient's most recent lab result is older than the maximum 21 days allowed.).    No Known Allergies  Isaac Bliss, PharmD, BCPS, BCCCP Clinical Pharmacist [phone]  Please check AMION for all [facility] Pharmacy numbers  02/14/2018 1:41 PM

## 2018-02-14 NOTE — ED Provider Notes (Signed)
MSE was initiated and I personally evaluated the patient and placed orders (if any) at  1:17 PM on February 14, 2018.  The patient appears stable so that the remainder of the MSE may be completed by another provider.  Patient placed in Quick Look pathway, seen and evaluated   Chief Complaint: Finger infection HPI:   32 year old male with a medical history of IV drug use who states that he is currently sober presents for evaluation of finger infection.  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  He did try to remove them but states he could not get all of them out.  He has had worsening pain and swelling presents today Tachycardic and febrile.   ROS: Finger pain and swelling (one)  Physical Exam:   Gen: No distress  Neuro: Awake and Alert  Skin: Warm    Focused Exam: Markedly swollen and tender sausage digit appearing index finger on the right hand.  No pain with passive flexion or extension.  There is heat and swelling through the palmar aspect and some tracking of erythema of the dorsal surface of the hand.  Notably patient has a systolic flow murmur on examination which may be secondary to fever however I have drawn 3 cultures with consideration for potential endocarditis and this previous IV drug user.   Initiation of care has begun. The patient has been counseled on the process, plan, and necessity for staying for the completion/evaluation, and the remainder of the medical screening examination\  \           Arthor Captain, PA-C 02/14/18 1323    Gwyneth Sprout, MD 02/14/18 1531

## 2018-02-14 NOTE — H&P (Signed)
Date: 02/14/2018               Patient Name:  Hunter [name] MRN: [id number]  DOB: 11-07-1985 Age / Sex: 32 y.o., male   PCP: Patient, No Pcp Per         Medical Service: Internal Medicine Teaching Service         Attending Physician: Dr. Rogelia Boga, Austin Miles, MD    First Contact: Dr. Judeth Cornfield Pager: [phone]  Second Contact: Dr. Ginette Otto Pager: [phone]       After Hours (After 5p/  First Contact Pager: [phone]  weekends / holidays): Second Contact Pager: [phone]   Chief Complaint: Right finger swelling and pain  History of Present Illness:  Hunter [name] is a 32 yo M w/ PMH of tourettes and IVDU hx presenting with swollen right index finger. 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 He tried to remove all the splinters but some of them stayed in. He was able to tolerate the discomfort at the time and was continuing with his job as usual until about 2 days ago when his right index finger swelling worsened significantly. He tried to treat with Lortab yesterday, which he 'got from a friend' but the pain continued to worsen in severity and he came into the ED today. He denies any febrile episodes before coming in but states in the ED he was febrile. He does endorse some headaches but denies any N/V/D/C. He also states that his last IVDU was more than 6 years ago but he 'snorted some stuff here and there' since then.   In the ED He was found to have leukocytosis (WBC 14) and febrile (100.19F). Blood culture was collected and he was started on Vanc and Rocephin. Ortho and Hand surgery was consulted and plans to take him to OR for I&D today.  Meds:  Current Meds  Medication Sig  . cyclobenzaprine (FLEXERIL) 5 MG tablet Take 1 tablet (5 mg total) by mouth 3 (three) times daily as needed for muscle spasms.   Allergies: Allergies as of 02/14/2018  . (No Known  Allergies)   Past Medical History:  Diagnosis Date  . Chronic hepatitis C without hepatic coma (HCC) 04/28/2017  . Hepatitis-C   . IVDU (intravenous drug user)   . 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 Last IVDU 6 months ago. Reports smoking 'some stuff now and then'  Review of Systems: A complete ROS was negative except as per HPI.   Physical Exam: Blood pressure (!) 148/110, pulse (!) 110, temperature (!) 100.6 F (38.1 C), temperature source Oral, resp. rate 18, height 6\' 1"  (1.854 m), weight 81 kg, SpO2 100 %. Physical Exam  Constitutional: He is oriented to person, place, and time and well-developed, well-nourished, and in no distress. No distress.  HENT:  Head: Normocephalic and atraumatic.  Mouth/Throat: Oropharynx is clear and moist. No oropharyngeal exudate.  Eyes: Pupils are equal, round, and reactive to light. Conjunctivae and EOM are normal. No scleral icterus.  Cardiovascular: Normal rate, regular rhythm, normal heart sounds and intact distal pulses.  Pulmonary/Chest: Effort normal and breath sounds normal.  Abdominal: Soft. Bowel sounds are normal. He exhibits no distension. There is no tenderness. There is no guarding.  Musculoskeletal: He exhibits edema (Significant non-pitting edema of right index finger with warmth and tenderness to palpation and movement. Refer to ED provider note for picture. 1+ pitting edema bilateral lower extremities up to knees) and tenderness.  Neurological: He is alert and oriented to person, place, and time.  Skin: Skin is warm and dry. He is not diaphoretic.  Psychiatric: Mood, memory, affect and judgment normal.     EKG: personally reviewed my interpretation is sinus tachycardia. Normal Axis. No ST changes.   Assessment & Plan by Problem: Active Problems:   Cellulitis and abscess of hand  Hunter [name] is a 32 year old male with PMH of  Tourette's and Hx of IVDU presents with right index finger cellulitis and possible abscess.  He will be going to OR tonight for I&D tonight per surgery.  We will admit to MedSurg and manage his IV antibiotics and follow cultures.  Right index finger cellulitis 2/2 foreign object impalement Hx of possible retained thorn. Significant leukocytosis (WBC 14) and febrile (100.51F) on admission. OR tonight for I&D - F/u blood & wound cultures - C/w IV vanc and Rocephin - Trend CBCs - Tylenol Prn for fever and pain  Tourette syndrome States has not been on medication for 6 months or longer. Endorses upper extremity motor tic. - No therapy at this time  Hx of IVDU States last IVDU 6 months ago. Denies any recent use. Used inhaled substances now and then couple months ago. - Monitor for possible withdrawal  DVT prophx: Subqheparin Diet: Npo for OR Code: Full  Dispo: Admit patient to Inpatient with expected length of stay greater than 2 midnights.  Signed: Theotis Barrio, MD 02/14/2018, 5:05 PM  Pager: [phone]

## 2018-02-14 NOTE — Consult Note (Addendum)
Reason for Consult:Right hand infection Referring Physician: W Xzaiver Vayda is an 32 y.o. male.  HPI: Hunter [name] had cleared some brush last week and had some thorns in his right index finger. It began to swell on Saturday. He got a thorn and some pus out near the cuticle and after that it worsened rapidly. He came today to the ED and hand surgery was consulted. 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 He had a previous right hand/forearm infection years ago that required I&D.  Past Medical History:  Diagnosis Date  . Chronic hepatitis C without hepatic coma ([place]) 04/28/2017  . Hepatitis-C   . IVDU (intravenous drug user)   . Tourette's     Past Surgical History:  Procedure Laterality Date  . I&D EXTREMITY Right 04/26/2017   Procedure: IRRIGATION AND DEBRIDEMENT RIGHT HAND AND FOREARM;  Surgeon: Iran Planas, MD;  Location: [facility];  Service: Orthopedics;  Laterality: Right;  . TEE WITHOUT CARDIOVERSION N/A 04/30/2017   Procedure: TRANSESOPHAGEAL ECHOCARDIOGRAM (TEE);  Surgeon: Sueanne Margarita, MD;  Location: [facility] ENDOSCOPY;  Service: Cardiovascular;  Laterality: N/A;  . TEE WITHOUT CARDIOVERSION N/A 05/28/2017   Procedure: TRANSESOPHAGEAL ECHOCARDIOGRAM (TEE);  Surgeon: Fay Records, MD;  Location[facility] ENDOSCOPY;  Service: Cardiovascular;  Laterality: N/A;  . TONSILLECTOMY      No family history on file.  Social History:  reports that he has been smoking cigarettes. He has never used smokeless tobacco. He reports that he has current or past drug history. Drug: IV. He reports that he does not drink alcohol.  Allergies: No Known Allergies  Medications: I have reviewed the patient's current medications.  Results for orders placed or performed during the hospital encounter of 02/14/18 (from the past 48 hour(s))  Comprehensive metabolic panel     Status: Abnormal   Collection Time: 02/14/18  1:21 PM  Result Value Ref Range   Sodium 135 135 - 145 mmol/L   Potassium  3.6 3.5 - 5.1 mmol/L   Chloride 98 98 - 111 mmol/L   CO2 27 22 - 32 mmol/L   Glucose, Bld 127 (H) 70 - 99 mg/dL   BUN 7 6 - 20 mg/dL   Creatinine, Ser 0.79 0.61 - 1.24 mg/dL   Calcium 9.0 8.9 - 10.3 mg/dL   Total Protein 8.4 (H) 6.5 - 8.1 g/dL   Albumin 3.6 3.5 - 5.0 g/dL   AST 25 15 - 41 U/L   ALT 30 0 - 44 U/L   Alkaline Phosphatase 161 (H) 38 - 126 U/L   Total Bilirubin 1.3 (H) 0.3 - 1.2 mg/dL   GFR calc non Af Amer >60 >60 mL/min   GFR calc Af Amer >60 >60 mL/min    Comment: (NOTE) The eGFR has been calculated using the CKD EPI equation. This calculation has not been validated in all clinical situations. eGFR's persistently <60 mL/min signify possible Chronic Kidney Disease.    Anion gap 10 5 - 15    Comment: Performed at [facility] [address], [place], [place] [address]  CBC with Differential     Status: Abnormal   Collection Time: 02/14/18  1:21 PM  Result Value Ref Range   WBC 14.0 (H) 4.0 - 10.5 K/uL   RBC 3.86 (L) 4.22 - 5.81 MIL/uL   Hemoglobin 11.1 (L) 13.0 - 17.0 g/dL   HCT 35.2 (L) 39.0 - 52.0 %   MCV 91.2 78.0 - 100.0 fL   MCH  28.8 26.0 - 34.0 pg   MCHC 31.5 30.0 - 36.0 g/dL   RDW 15.5 11.5 - 15.5 %   Platelets 282 150 - 400 K/uL   Neutrophils Relative % 72 %   Neutro Abs 10.2 (H) 1.7 - 7.7 K/uL   Lymphocytes Relative 14 %   Lymphs Abs 2.0 0.7 - 4.0 K/uL   Monocytes Relative 12 %   Monocytes Absolute 1.7 (H) 0.1 - 1.0 K/uL   Eosinophils Relative 1 %   Eosinophils Absolute 0.1 0.0 - 0.7 K/uL   Basophils Relative 0 %   Basophils Absolute 0.0 0.0 - 0.1 K/uL   Immature Granulocytes 1 %   Abs Immature Granulocytes 0.1 0.0 - 0.1 K/uL    Comment: Performed at [facility] [address], [place], [place] [address]  I-Stat CG4 Lactic Acid, ED     Status: None   Collection Time: 02/14/18  1:31 PM  Result Value Ref Range   Lactic Acid, Venous 0.89 0.5 - 1.9 mmol/L    No results found.  Review of Systems  Constitutional: Negative for  chills, fever and weight loss.  HENT: Negative for ear discharge, ear pain, hearing loss and tinnitus.   Eyes: Negative for blurred vision, double vision, photophobia and pain.  Respiratory: Negative for cough, sputum production and shortness of breath.   Cardiovascular: Negative for chest pain.  Gastrointestinal: Negative for abdominal pain, nausea and vomiting.  Genitourinary: Negative for dysuria, flank pain, frequency and urgency.  Musculoskeletal: Positive for joint pain (Right hand). Negative for back pain, falls, myalgias and neck pain.  Neurological: Negative for dizziness, tingling, sensory change, focal weakness, loss of consciousness and headaches.  Endo/Heme/Allergies: Does not bruise/bleed easily.  Psychiatric/Behavioral: Negative for depression, memory loss and substance abuse. The patient is not nervous/anxious.   All other systems reviewed and are negative.  Blood pressure (!) 148/110, pulse (!) 110, temperature (!) 100.6 F (38.1 C), temperature source Oral, resp. rate 18, height 6' 1"  (1.854 m), weight 81 kg, SpO2 100 %. Physical Exam  Constitutional: He appears well-developed and well-nourished. No distress.  HENT:  Head: Normocephalic and atraumatic.  Eyes: Conjunctivae are normal. Right eye exhibits no discharge. Left eye exhibits no discharge. No scleral icterus.  Neck: Normal range of motion.  Cardiovascular: Normal rate and regular rhythm.  Respiratory: Effort normal. No respiratory distress.  Musculoskeletal:  Right shoulder, elbow, wrist, digits- no skin wounds, index finger fusiform edema, radial palmar edema/erythema, no instability, unable to flex index finger  Sens  Ax/R/M/U intact  Mot   Ax/ R/ PIN/ M/ AIN/ U intact but limited 2/2 pain  Rad 2+  Neurological: He is alert.  Skin: Skin is warm and dry. He is not diaphoretic.  Psychiatric: He has a normal mood and affect. His behavior is normal.    Assessment/Plan: Right hand infection -- For I&D this  evening by Dr. Grandville Silos. IM to admit for IV abx. Please keep NPO.    Lisette Abu, PA-C Orthopedic Surgery [phone] 02/14/2018, 3:58 PM    R IF infection, large edema, not much flexed posture.  Hand surgery can assist medicine in treating this problem by incising the skin, obtaining deep cultures, and irrigating the wound/debriding any non-viable tissue to decrease the infection load/burden.  Will take to OR tonight as operative resources allow to begin the process.  I will remain available as needed to assist in any surgical aspects of his infection care.  Micheline Rough, MD Hand Surgery

## 2018-02-15 ENCOUNTER — Other Ambulatory Visit: Payer: Self-pay

## 2018-02-15 ENCOUNTER — Encounter (HOSPITAL_COMMUNITY): Payer: Self-pay | Admitting: Orthopedic Surgery

## 2018-02-15 DIAGNOSIS — D72829 Elevated white blood cell count, unspecified: Secondary | ICD-10-CM

## 2018-02-15 DIAGNOSIS — F1990 Other psychoactive substance use, unspecified, uncomplicated: Secondary | ICD-10-CM

## 2018-02-15 LAB — COMPREHENSIVE METABOLIC PANEL
ALK PHOS: 126 U/L (ref 38–126)
ALT: 25 U/L (ref 0–44)
ANION GAP: 6 (ref 5–15)
AST: 38 U/L (ref 15–41)
Albumin: 2.7 g/dL — ABNORMAL LOW (ref 3.5–5.0)
BUN: 5 mg/dL — ABNORMAL LOW (ref 6–20)
CALCIUM: 8 mg/dL — AB (ref 8.9–10.3)
CO2: 26 mmol/L (ref 22–32)
Chloride: 105 mmol/L (ref 98–111)
Creatinine, Ser: 0.71 mg/dL (ref 0.61–1.24)
GFR calc Af Amer: >60 mL/min (ref >60)
GFR calc non Af Amer: >60 mL/min (ref >60)
Glucose, Bld: 117 mg/dL — ABNORMAL HIGH (ref 70–99)
Potassium: 3.9 mmol/L (ref 3.5–5.1)
Sodium: 137 mmol/L (ref 135–145)
Total Bilirubin: 0.8 mg/dL (ref 0.3–1.2)
Total Protein: 6.3 g/dL — ABNORMAL LOW (ref 6.5–8.1)

## 2018-02-15 LAB — CBC
HCT: 29.7 % — ABNORMAL LOW (ref 39.0–52.0)
Hemoglobin: 9.4 g/dL — ABNORMAL LOW (ref 13.0–17.0)
MCH: 28.4 pg (ref 26.0–34.0)
MCHC: 31.6 g/dL (ref 30.0–36.0)
MCV: 89.7 fL (ref 78.0–100.0)
PLATELETS: 232 10*3/uL (ref 150–400)
RBC: 3.31 MIL/uL — AB (ref 4.22–5.81)
RDW: 15.1 % (ref 11.5–15.5)
WBC: 10.4 10*3/uL (ref 4.0–10.5)

## 2018-02-15 MED ORDER — POLYETHYLENE GLYCOL 3350 17 G PO PACK: 17.0000 g | PACK | Freq: Every day | ORAL | Status: DC | PRN

## 2018-02-15 MED ORDER — HYDROCODONE-ACETAMINOPHEN 5-325 MG PO TABS
1.0000 | ORAL_TABLET | Freq: Four times a day (QID) | ORAL | Status: DC | PRN
  Administered 2018-02-15 – 2018-02-16 (×4): 1 via ORAL
  Filled 2018-02-15 (×4): qty 1

## 2018-02-15 MED ORDER — VANCOMYCIN HCL IN DEXTROSE 1-5 GM/200ML-% IV SOLN
1000.0000 mg | Freq: Three times a day (TID) | INTRAVENOUS | Status: DC
  Administered 2018-02-15 – 2018-02-16 (×5): 1000 mg via INTRAVENOUS
  Filled 2018-02-15 (×6): qty 200

## 2018-02-15 MED ORDER — HYDROMORPHONE HCL 1 MG/ML IJ SOLN
1.0000 mg | INTRAMUSCULAR | Status: DC | PRN
  Administered 2018-02-15 – 2018-02-16 (×8): 1 mg via INTRAVENOUS
  Filled 2018-02-15 (×8): qty 1

## 2018-02-15 MED ORDER — SODIUM CHLORIDE 0.9 % IV SOLN
2.0000 g | INTRAVENOUS | Status: DC
  Administered 2018-02-15: 2 g via INTRAVENOUS
  Filled 2018-02-15 (×2): qty 20

## 2018-02-15 MED ORDER — ACETAMINOPHEN 650 MG RE SUPP: 650.0000 mg | Freq: Four times a day (QID) | RECTAL | Status: DC | PRN

## 2018-02-15 MED ORDER — ONDANSETRON HCL 4 MG/2ML IJ SOLN: 4.0000 mg | Freq: Four times a day (QID) | INTRAMUSCULAR | Status: DC | PRN

## 2018-02-15 MED ORDER — ONDANSETRON HCL 4 MG PO TABS: 4.0000 mg | ORAL_TABLET | Freq: Four times a day (QID) | ORAL | Status: DC | PRN

## 2018-02-15 MED ORDER — ACETAMINOPHEN 325 MG PO TABS: 650.0000 mg | ORAL_TABLET | Freq: Four times a day (QID) | ORAL | Status: DC | PRN

## 2018-02-15 NOTE — Progress Notes (Signed)
   Subjective:  Hunter [name] is a 32 y.o. with PMH of Tourettes admit for right finger cellulitis on hospital day 1  Hunter [name] was examined and evaluated at bedside this a.m.  He states his finger pain is still bothering him but is tolerable with pain medication.   He understands that he will go back to the OR for closing of his wound on Wednesday. Denies any F/N/V/D/C.  Objective:  Vital signs in last 24 hours: Vitals:   02/14/18 2331 02/15/18 0442 02/15/18 0900 02/15/18 1146  BP: (!) 128/99 123/75 118/79 118/68  Pulse: 93 81 84 83  Resp: 18 16 14 14   Temp: 99.5 F (37.5 C) 99.1 F (37.3 C) 98.1 F (36.7 C) 98.4 F (36.9 C)  TempSrc: Oral Oral Oral Oral  SpO2: 100% 100% 100% 99%  Weight:      Height:       Physical Exam  Constitutional: He is oriented to person, place, and time and well-developed, well-nourished, and in no distress. No distress.  HENT:  Head: Normocephalic and atraumatic.  Mouth/Throat: Oropharynx is clear and moist. No oropharyngeal exudate.  Eyes: Pupils are equal, round, and reactive to light. Conjunctivae and EOM are normal. No scleral icterus.  Neck: Normal range of motion. Neck supple.  Cardiovascular: Normal rate, regular rhythm, normal heart sounds and intact distal pulses.  Pulmonary/Chest: Effort normal and breath sounds normal.  Abdominal: Soft. Bowel sounds are normal.  Musculoskeletal: Normal range of motion. He exhibits edema (Right hand covered in bandaging. Did not remove to inspect. Continuing to endorse significant nonpitting edema).  No R axillary lymphadenoxpathy  Lymphadenopathy:    He has no cervical adenopathy.  Neurological: He is alert and oriented to person, place, and time. No cranial nerve deficit.  Skin: Skin is warm and dry. He is not diaphoretic.  Psychiatric: Memory, affect and judgment normal.   Assessment/Plan:  Active Problems:   Sepsis (HCC)   Cellulitis and abscess of hand   Cellulitis of right hand   Abscess  of right index finger  Hunter [name] is a 32 year old male with PMH of Tourette's and Hx of IVDU presents with right index finger cellulitis and possible abscess.  Tolerate procedure I&D yesterday. Leukocytosis resolving. Hand surgery recommend returning to OR on 02/16/18 for wound closure. Will monitor and change IV abx when culture results come.    Right index finger cellulitis 2/2 foreign object impalement WBC 10.4 <- 14 No febrile episodes last 24 hrs Return to OR tomorrow. Gram stain from wound show gram positive cocci. - F/u blood & wound cultures - C/w IV vanc and Rocephin - Trend CBCs - Tylenol PRN for fever - Norco/Vicodin 5-325mg  q6hr PRN for pain, Dilaudid 1mg  q4hrs PRN for breakthrough  Hx of IVDU - Monitor for possible withdrawal  DVT prophx: Subqheparin Diet: Regular. NPO at midnight Code: Full  Dispo: Anticipated discharge in approximately 3 day(s).   Theotis Barrio, MD 02/15/2018, 1:19 PM Pager: [phone]

## 2018-02-15 NOTE — Progress Notes (Signed)
Date: 02/15/2018  Patient name: Hunter [name] [employer]  Medical record number: [id number]  Date of birth: Jul 07, 1985   I have seen and evaluated Hunter [name] [facility] and discussed their care with the Residency Team. Hunter [name] is a 32 yo man with h/o IVDU, Methodone treatment (stopped summer 2018), and Buprenorphine treatment in Jan 2019 when he was admitted for MSSA bacteremia, psoas abscess, and lumbar disk infection.  He was admitted yesterday for a right index finger infection which started about a week ago when he got thorns stuck in his right hand.  The pain and swelling worsened and he also developed fevers and headaches.  He was taken to the OR on the day of admission where gross purulence was encountered but no foreign bodies were seen.  The infection involved the flexor tendon sheath which was also flushed.  The wound was left open and surgery plans to return to the operating room tomorrow for additional washout and wound closure.  Today, he states his pain is well controlled and since midnight, he got two 1 mg doses of Dilaudid along with 5 mg of hydrocodone.  He was able to sleep and has no nausea or vomiting.  Vitals:   02/15/18 0442 02/15/18 0900  BP: 123/75 118/79  Pulse: 81 84  Resp: 16 14  Temp: 99.1 F (37.3 C) 98.1 F (36.7 C)  SpO2: 100% 100%  Young thin male, NAD HRRR no MRG LCTAB with good air flow ABD + BS< soft, NT Ext no edema Skin Tattoos, multiple scars well healed R hand bandaged and elevated, sensation in all fingertips per Dr Marigene Ehlers exam  Alb 2.7 WBC 14 - 12.7 0- 10.4 HgB 11.1 - 10.2 - 9.4 (10.7 in Jan 2019) MCV 90 RDW 232  I personally viewed the EKG and confirmed my reading with the official read. Sinus, nl axis, LAH, no ischemic changes  Assessment and Plan: I have seen and evaluated the patient as outlined above. I agree with the formulated Assessment and Plan as detailed in the residents' note, with the following changes:  Hunter [name] is a 32 yo man with h/o  IVDU, Methodone treatment (stopped summer 2018), and Buprenorphine treatment in Jan 2019 when he was admitted for MSSA bacteremia, psoas abscess, and lumbar disk infection.  He was admitted yesterday for a right index finger infection and was taken to the OR for I&D and irrigation.  The wound was left open and the surgeons intend to return to the operating room tomorrow for more irrigation and wound closure.  Wound cultures are pending but in the meantime he is on vancomycin and Rocephin.   1.  Right index finger infection status post I&D -we will follow surgery's recommendations and continue vancomycin and Rocephin until culture results have returned.  His pain control has not been an issue and he is well controlled on as needed hydrocodone and Dilaudid.  2.  History of IV drug use with MAT -we will need to discuss with him where he is in his recovery and plans for going forward.  We certainly have the option to offer him Suboxone after his surgery is complete for maintenance treatment of his OUD.   3.  Normocytic anemia -since his anemia is chronic, I suspect this is due to anemia of chronic disease.  He has not had iron studies and I would prefer to wait until the acute infection has resolved to check iron studies.  He will proceed back to the OR tomorrow and I would  anticipate barring any surprises that he will be stable for discharge on the 12th or the 13th with antibiotics off the $4 list.  Burns Spain, MD 9/10/201911:35 AM

## 2018-02-16 ENCOUNTER — Encounter (HOSPITAL_COMMUNITY)
Admission: EM | Disposition: A | Discharge status: Home / Self Care | Payer: Self-pay | Source: Home / Self Care | Attending: Internal Medicine

## 2018-02-16 LAB — CBC
HEMATOCRIT: 30.9 % — AB (ref 39.0–52.0)
Hemoglobin: 9.9 g/dL — ABNORMAL LOW (ref 13.0–17.0)
MCH: 28.9 pg (ref 26.0–34.0)
MCHC: 32 g/dL (ref 30.0–36.0)
MCV: 90.1 fL (ref 78.0–100.0)
Platelets: 218 10*3/uL (ref 150–400)
RBC: 3.43 MIL/uL — ABNORMAL LOW (ref 4.22–5.81)
RDW: 14.7 % (ref 11.5–15.5)
WBC: 8 10*3/uL (ref 4.0–10.5)

## 2018-02-16 LAB — HIV ANTIBODY (ROUTINE TESTING W REFLEX): HIV Screen 4th Generation wRfx: NONREACTIVE

## 2018-02-16 LAB — HCV RNA QUANT
HCV QUANT LOG: 3.314 {Log_IU}/mL (ref >1.70)
HCV Quantitative: 2060 IU/mL (ref >50)

## 2018-02-16 LAB — SURGICAL PCR SCREEN
MRSA, PCR: POSITIVE — AB
Staphylococcus aureus: POSITIVE — AB

## 2018-02-16 SURGERY — IRRIGATION AND DEBRIDEMENT EXTREMITY
Anesthesia: Monitor Anesthesia Care | Laterality: Right

## 2018-02-16 MED ORDER — POLYETHYLENE GLYCOL 3350 17 G PO PACK: 17.0000 g | PACK | Freq: Every day | ORAL | 0 refills | Status: AC | PRN

## 2018-02-16 MED ORDER — NAPROXEN 250 MG PO TABS
500.0000 mg | ORAL_TABLET | Freq: Two times a day (BID) | ORAL | Status: DC
  Filled 2018-02-16 (×2): qty 2

## 2018-02-16 MED ORDER — MIDAZOLAM HCL 2 MG/2ML IJ SOLN
INTRAMUSCULAR | Status: AC
  Filled 2018-02-16: qty 2

## 2018-02-16 MED ORDER — LIDOCAINE-EPINEPHRINE (PF) 2 %-1:200000 IJ SOLN
20.0000 mL | Freq: Once | INTRAMUSCULAR | Status: AC
  Administered 2018-02-16: 20 mL
  Filled 2018-02-16: qty 20

## 2018-02-16 MED ORDER — OXYCODONE HCL 5 MG PO TABS: 0.0000 mg | ORAL_TABLET | Freq: Four times a day (QID) | ORAL | Status: DC | PRN

## 2018-02-16 MED ORDER — CHLORHEXIDINE GLUCONATE CLOTH 2 % EX PADS: 6.0000 | MEDICATED_PAD | Freq: Every day | CUTANEOUS | Status: DC

## 2018-02-16 MED ORDER — ACETAMINOPHEN 325 MG PO TABS
650.0000 mg | ORAL_TABLET | Freq: Four times a day (QID) | ORAL | Status: DC
  Administered 2018-02-16: 650 mg via ORAL
  Filled 2018-02-16: qty 2

## 2018-02-16 MED ORDER — MUPIROCIN 2 % EX OINT
1.0000 "application " | TOPICAL_OINTMENT | Freq: Two times a day (BID) | CUTANEOUS | Status: DC
  Administered 2018-02-16: 1 via NASAL

## 2018-02-16 MED ORDER — BACITRACIN ZINC 500 UNIT/GM EX OINT
TOPICAL_OINTMENT | Freq: Two times a day (BID) | CUTANEOUS | Status: DC
  Filled 2018-02-16: qty 28.4

## 2018-02-16 MED ORDER — FENTANYL CITRATE (PF) 250 MCG/5ML IJ SOLN
INTRAMUSCULAR | Status: AC
  Filled 2018-02-16: qty 5

## 2018-02-16 MED ORDER — OXYCODONE HCL 5 MG PO TABS: 0.0000 mg | ORAL_TABLET | Freq: Four times a day (QID) | ORAL | 0 refills | Status: AC | PRN

## 2018-02-16 MED ORDER — NAPROXEN 500 MG PO TABS: 500.0000 mg | ORAL_TABLET | Freq: Two times a day (BID) | ORAL | 0 refills | Status: DC

## 2018-02-16 MED ORDER — PROPOFOL 10 MG/ML IV BOLUS
INTRAVENOUS | Status: AC
  Filled 2018-02-16: qty 20

## 2018-02-16 MED ORDER — BACITRACIN ZINC 500 UNIT/GM EX OINT: TOPICAL_OINTMENT | Freq: Two times a day (BID) | CUTANEOUS | 0 refills | Status: AC

## 2018-02-16 MED ORDER — LIDOCAINE-EPINEPHRINE 2 %-1:50000 IJ SOLN: 10.0000 mL | Freq: Once | INTRAMUSCULAR | Status: DC

## 2018-02-16 MED ORDER — SULFAMETHOXAZOLE-TRIMETHOPRIM 800-160 MG PO TABS: 1.0000 | ORAL_TABLET | Freq: Two times a day (BID) | ORAL | 0 refills | Status: DC

## 2018-02-16 MED ORDER — ACETAMINOPHEN 325 MG PO TABS: 650.0000 mg | ORAL_TABLET | Freq: Four times a day (QID) | ORAL | 1 refills | Status: DC | PRN

## 2018-02-16 NOTE — Plan of Care (Signed)
  Problem: Clinical Measurements: Goal: Ability to maintain clinical measurements within normal limits will improve 02/16/2018 1640 by Darreld Mclean, RN Outcome: Adequate for Discharge 02/16/2018 1040 by Darreld Mclean, RN Outcome: Progressing Goal: Will remain free from infection 02/16/2018 1640 by Darreld Mclean, RN Outcome: Adequate for Discharge 02/16/2018 1040 by Darreld Mclean, RN Outcome: Progressing Goal: Diagnostic test results will improve 02/16/2018 1640 by Darreld Mclean, RN Outcome: Adequate for Discharge 02/16/2018 1040 by Darreld Mclean, RN Outcome: Progressing Goal: Respiratory complications will improve 02/16/2018 1640 by Darreld Mclean, RN Outcome: Adequate for Discharge 02/16/2018 1040 by Darreld Mclean, RN Outcome: Progressing Goal: Cardiovascular complication will be avoided 02/16/2018 1640 by Darreld Mclean, RN Outcome: Adequate for Discharge 02/16/2018 1040 by Darreld Mclean, RN Outcome: Progressing   Problem: Activity: Goal: Risk for activity intolerance will decrease 02/16/2018 1640 by Darreld Mclean, RN Outcome: Adequate for Discharge 02/16/2018 1040 by Darreld Mclean, RN Outcome: Progressing   Problem: Nutrition: Goal: Adequate nutrition will be maintained 02/16/2018 1640 by Darreld Mclean, RN Outcome: Adequate for Discharge 02/16/2018 1040 by Darreld Mclean, RN Outcome: Progressing   Problem: Coping: Goal: Level of anxiety will decrease 02/16/2018 1640 by Darreld Mclean, RN Outcome: Adequate for Discharge 02/16/2018 1040 by Darreld Mclean, RN Outcome: Progressing   Problem: Elimination: Goal: Will not experience complications related to bowel motility 02/16/2018 1640 by Darreld Mclean, RN Outcome: Adequate for Discharge 02/16/2018 1040 by Darreld Mclean, RN Outcome: Progressing Goal: Will not experience complications related to urinary retention 02/16/2018 1640 by Darreld Mclean, RN Outcome: Adequate for Discharge 02/16/2018 1040 by Darreld Mclean, RN Outcome: Progressing   Problem: Pain Managment: Goal: General experience of comfort will  improve 02/16/2018 1640 by Darreld Mclean, RN Outcome: Adequate for Discharge 02/16/2018 1040 by Darreld Mclean, RN Outcome: Progressing   Problem: Safety: Goal: Ability to remain free from injury will improve 02/16/2018 1640 by Darreld Mclean, RN Outcome: Adequate for Discharge 02/16/2018 1040 by Darreld Mclean, RN Outcome: Progressing   Problem: Skin Integrity: Goal: Risk for impaired skin integrity will decrease 02/16/2018 1640 by Darreld Mclean, RN Outcome: Adequate for Discharge 02/16/2018 1040 by Darreld Mclean, RN Outcome: Progressing   Problem: Clinical Measurements: Goal: Ability to avoid or minimize complications of infection will improve 02/16/2018 1640 by Darreld Mclean, RN Outcome: Adequate for Discharge 02/16/2018 1040 by Darreld Mclean, RN Outcome: Progressing   Problem: Skin Integrity: Goal: Skin integrity will improve 02/16/2018 1640 by Darreld Mclean, RN Outcome: Adequate for Discharge 02/16/2018 1040 by Darreld Mclean, RN Outcome: Progressing

## 2018-02-16 NOTE — Progress Notes (Signed)
   Subjective:  Jadence Butterly is a 32 y.o. with PMH of IVDU admit for right finger cellulitis on hospital day 2  Mr. Sayer was examined and evaluated at bedside this a.m.  He states besides the finger pain no acute events night he said the closure of the wound was performed at bedside.  Was able to tolerated well.  Also confirmed that he has out-of-state Medicaid which provide him insurance coverage in [place]. Denies any F/N/V/D/C.    Objective:  Vital signs in last 24 hours: Vitals:   02/15/18 1146 02/15/18 1500 02/15/18 1924 02/16/18 0330  BP: 118/68 124/68 129/82 125/85  Pulse: 83 85 88 79  Resp: 14 14 17 16   Temp: 98.4 F (36.9 C) 98.6 F (37 C) 99 F (37.2 C) 98.6 F (37 C)  TempSrc: Oral Oral Oral Oral  SpO2: 99% 100% 99% 100%  Weight:      Height:       Physical Exam  Constitutional: He is oriented to person, place, and time and well-developed, well-nourished, and in no distress. No distress.  HENT:  Head: Atraumatic.  Mouth/Throat: Oropharynx is clear and moist. No oropharyngeal exudate.  Eyes: Pupils are equal, round, and reactive to light. Conjunctivae and EOM are normal. No scleral icterus.  Neck: Normal range of motion. Neck supple. No JVD present.  Cardiovascular: Normal rate, regular rhythm, normal heart sounds and intact distal pulses.  Pulmonary/Chest: Effort normal and breath sounds normal. No respiratory distress. He has no rales.  Abdominal: Soft. Bowel sounds are normal. He exhibits no distension. There is no tenderness.  Musculoskeletal: Normal range of motion. He exhibits edema (Right index finger covered in bandaging. Right hand feel warm to touch and exhibiting non-pitting edema) and tenderness.  No axillary lymphadenopathy  Neurological: He is alert and oriented to person, place, and time. No cranial nerve deficit. GCS score is 15.  Skin: Skin is warm and dry. He is not diaphoretic.  Psychiatric: Mood, memory, affect and judgment normal.    Assessment/Plan: Mr. Palmateer is a 32 year old male with past medical history of Tourette's and history of IVDU present acting with right index finger cellulitis.  He underwent closure of wound this morning a.m.  The complications.  He will be discharged today pending OT eval on empiric oral antibiotics with close follow-up to change antibiotic therapy when culture results are completed.  Right index finger cellulitis 2/2 foreign object impalement WBC 8<-10.4 <- 14 No febrile episodes last 24 hrs. Gram stain from wound show gram positive cocci. No culture results yet. - F/u blood & wound cultures - F/u HIV, Hep C - C/w IV vanc and Rocephin (Will discharge on Augmentin & Bactrim) - Tylenol PRN for fever - Norco/Vicodin 5-325mg  q6hr PRN for pain, Dilaudid 1mg  q4hrs PRN for breakthrough (Will discharge on 7 day supply of oral pain) - Discharge pending OT eval  Hx of IVDU - Monitor for possible withdrawal  DVT prophx: Subqheparin Diet: Regular Code:Full  Dispo: Anticipated discharge in approximately today(s).   Theotis Barrio, MD 02/16/2018, 11:26 AM Pager: [phone]

## 2018-02-16 NOTE — Op Note (Signed)
02/14/2018  10:10 AM  PATIENT:  Hunter [name]  32 y.o. male  PRE-OPERATIVE DIAGNOSIS:  R IF deep infection with open wound  POST-OPERATIVE DIAGNOSIS:  Same  PROCEDURE:   1. R IF excisional debridement of skin and subcutaneous tissues    2.  Right index finger intermediate wound closure, 6 cm  SURGEON: Cliffton Asters. Janee Morn, MD  PHYSICIAN ASSISTANT: None  ANESTHESIA:  Digital block  SPECIMENS:  None  DRAINS:   None  EBL:  less than 50 mL  PREOPERATIVE INDICATIONS:  Hunter [name] is a  32 y.o. male with severe right index finger infection, having undergone initial treatment in the operating room 2 days ago, now ready for repeat irrigation, debridement, and wound closure  The risks benefits and alternatives were discussed with the patient preoperatively including but not limited to the risks of infection, bleeding, nerve injury, cardiopulmonary complications, the need for revision surgery, among others, and the patient verbalized understanding and verbally consented to proceed.  OPERATIVE IMPLANTS: None  OPERATIVE PROCEDURE:   The patient was identified in his hospital room.  After confirming the appropriate site for surgery, the previous dressing was removed, but the packing was not pulled.  Digital block with lidocaine bearing epinephrine was instilled by me at the base of the right index finger.  Once an appropriate degree of anesthetic had been obtained, the packing was removed.  The wound is fairly clean, just with a little bit of subcutaneous necrotic tissue that needed sharp debridement as well as some more of the skin of the finger pad.  The wound was copiously irrigated under running water at the sink.    Next, the digit was prepped with Betadine and draped in usual sterile fashion, using the bedside table as a platform for work.  Excisional debridement of some subcutaneous tissues as well as portions of the skin of the finger pad was performed sharply with scissors and  forceps.  The depth of the wound was explored, there was no longer any pockets of purulence.  The tissues looked fairly healthy, all things considered.  The wound was then closed with interrupted 3-0 Prolene suture, deliberately leaving some clefting to allow for continued drainage as might be needed.  The hand was then cleansed with Betadine and a light digital dressing applied.  DISPOSITION: He will remain on the floor for continued infection care per his primary team.  I reviewed range of motion exercises with him, ordered therapy, restarted his diet, begin a nonsteroidal anti-inflammatory medication, and ordered OT.  I will plan to have him back to the office in 2 weeks for wound check, suture removal, and assessment for need for extended formal outpatient therapy.

## 2018-02-16 NOTE — Plan of Care (Signed)
  Problem: Clinical Measurements: Goal: Ability to maintain clinical measurements within normal limits will improve Outcome: Progressing Goal: Will remain free from infection Outcome: Progressing Goal: Diagnostic test results will improve Outcome: Progressing Goal: Respiratory complications will improve Outcome: Progressing Goal: Cardiovascular complication will be avoided Outcome: Progressing   Problem: Activity: Goal: Risk for activity intolerance will decrease Outcome: Progressing   Problem: Nutrition: Goal: Adequate nutrition will be maintained Outcome: Progressing   Problem: Coping: Goal: Level of anxiety will decrease Outcome: Progressing   Problem: Elimination: Goal: Will not experience complications related to bowel motility Outcome: Progressing Goal: Will not experience complications related to urinary retention Outcome: Progressing   Problem: Pain Managment: Goal: General experience of comfort will improve Outcome: Progressing   Problem: Safety: Goal: Ability to remain free from injury will improve Outcome: Progressing   Problem: Skin Integrity: Goal: Risk for impaired skin integrity will decrease Outcome: Progressing   Problem: Clinical Measurements: Goal: Ability to avoid or minimize complications of infection will improve Outcome: Progressing   Problem: Skin Integrity: Goal: Skin integrity will improve Outcome: Progressing   

## 2018-02-16 NOTE — Discharge Instructions (Addendum)
Hunter [name]  You came to the hospital with an infected finger. We started you on antibiotics and our hand surgeon cleaned out your finger. Here are the recommendations for you at discharge:  START Bactrim 800-160mg  twice a day for 7 days We will prescribe you 1 week worth oxycodone for pain. Please only take them for severe pain   For Wound Care:  Contact Dr. Janee Morn regarding any questions or concerns you may have regarding wound care, dressing changes, motion exercises, etc.  Do the wound care as was taught in the hospital and work to get your finger to move normally

## 2018-02-16 NOTE — Evaluation (Signed)
Occupational Therapy Evaluation and Discharge Patient Details Name: Hunter [name] MRN: [id number] DOB: 11/23/1985 Today's Date: 02/16/2018    History of Present Illness Hunter [name] is a 32 y.o. with PMH of IVDU admit for right finger cellulitis, s/p I&D of R index finger   Clinical Impression   PTA Pt independent in ADL and mobility. Pt is mod I for all currently. Session focused on education for edema management and to establish HEP for R index finger. Educated Pt on exercises at each finger joint as well as functional tasks with focus on pincher grasp. Pt verbalized and demonstrated understanding. Pt with no further questions or concerns for OT. OT to sign off at this point acutely. Follow up with hand therapist as ordered by MD at follow up for further therapy.    Follow Up Recommendations  Follow surgeon's recommendation for DC plan and follow-up therapies    Equipment Recommendations  None recommended by OT    Recommendations for Other Services       Precautions / Restrictions Restrictions Weight Bearing Restrictions: No      Mobility Bed Mobility Overal bed mobility: Modified Independent                Transfers Overall transfer level: Modified independent                    Balance                                           ADL either performed or assessed with clinical judgement   ADL Overall ADL's : Modified independent                                       General ADL Comments: has been using L hand for tasks like feeding, can hold items in R hand,     Vision         Perception     Praxis      Pertinent Vitals/Pain Pain Assessment: Faces Faces Pain Scale: Hurts even more Pain Location: R finger with ROM exercises Pain Descriptors / Indicators: Discomfort;Sharp;Sore Pain Intervention(s): Monitored during session     Hand Dominance Right   Extremity/Trunk Assessment Upper Extremity  Assessment Upper Extremity Assessment: RUE deficits/detail RUE Deficits / Details: noted edema in R index finger, decreased ROM due to edema and bandages RUE Sensation: WNL RUE Coordination: decreased fine motor   Lower Extremity Assessment Lower Extremity Assessment: Overall WFL for tasks assessed       Communication Communication Communication: No difficulties   Cognition Arousal/Alertness: Awake/alert Behavior During Therapy: WFL for tasks assessed/performed Overall Cognitive Status: Within Functional Limits for tasks assessed                                     General Comments       Exercises Exercises: Other exercises Other Exercises Other Exercises: bending at each of the digit joints, MCP, PIP, DIP Other Exercises: encouraged functional use of R hand for tasks Other Exercises: picking up small objects using pincher motion and putting in cup   Shoulder Instructions      Home Living Family/patient expects to be discharged to:: Unsure  Prior Functioning/Environment Level of Independence: Independent        Comments: has been working as a Geologist, 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        OT Problem List:        OT Treatment/Interventions:      OT Goals(Current goals can be found in the care plan section) Acute Rehab OT Goals Patient Stated Goal: to get out of the hospital OT Goal Formulation: With patient Time For Goal Achievement: 03/02/18 Potential to Achieve Goals: Good  OT Frequency:     Barriers to D/C:            Co-evaluation              AM-PAC PT "6 Clicks" Daily Activity     Outcome Measure Help from another person eating meals?: None Help from another person taking care of personal grooming?: None Help from another person toileting, which includes using toliet, bedpan, or urinal?: None Help from another person bathing (including washing, rinsing, drying)?:  None Help from another person to put on and taking off regular upper body clothing?: None Help from another person to put on and taking off regular lower body clothing?: None 6 Click Score: 24   End of Session Nurse Communication: Mobility status;Other (comment)(OT complete)  Activity Tolerance: Patient tolerated treatment well Patient left: in bed;with call bell/phone within reach  OT Visit Diagnosis: Pain Pain - Right/Left: Right Pain - part of body: Hand                Time: 1610-9604 OT Time Calculation (min): 20 min Charges:  OT General Charges $OT Visit: 1 Visit OT Evaluation $OT Eval Low Complexity: 1 Low  Sherryl Manges OTR/L Acute Rehabilitation Services Pager: [phone] Office: [phone]  Evern Bio Hillary Struss 02/16/2018, 3:50 PM

## 2018-02-16 NOTE — Progress Notes (Signed)
  Date: 02/16/2018  Patient name: Dajion Briar [facility]  Medical record number: [id number]  Date of birth: [birth year]   I have seen and evaluated this patient and I have discussed the plan of care with the house staff. Please see their note for complete details. I concur with their findings with the following additions/corrections: Mr Navarette was seen on morning rounds with the team.  He looks much better today and is much more active.  He is status post bedside right index finger excisional enlargement and wound closure by his surgeon today.  Culture is back as staph aureus so he will be discharged on Bactrim.  Surgery has left recommendations for wound care, exercising, and follow-up.  Dr. Nedra Hai also discussed MAT for OUD and he was agreeable to the visiting Suboxone in [facility].  Once his finger pain has resolved, he will be able to discuss treatment with our OUD team.  We will arrange [facility] follow-up.  Burns Spain, MD 02/16/2018, 3:25 PM

## 2018-02-16 NOTE — Discharge Summary (Signed)
Name: Hunter [name] [facility] MRN: [id number] DOB: May 15, 1986 32 y.o. PCP: Patient, No Pcp Per  Date of Admission: 02/14/2018  1:02 PM Date of Discharge: 02/16/2018 4:00 PM Attending Physician: No att. providers found  Discharge Diagnosis: 1. Right index finger cellulitis  Discharge Medications: Allergies as of 02/16/2018   No Known Allergies     Medication List    STOP taking these medications   cyclobenzaprine 5 MG tablet Commonly known as:  FLEXERIL     TAKE these medications   acetaminophen 325 MG tablet Commonly known as:  TYLENOL Take 2 tablets (650 mg total) by mouth every 6 (six) hours as needed for mild pain (or Fever >/= 101).   bacitracin ointment Apply topically 2 (two) times daily.   naproxen 500 MG tablet Commonly known as:  NAPROSYN Take 1 tablet (500 mg total) by mouth 2 (two) times daily with a meal.   oxyCODONE 5 MG immediate release tablet Commonly known as:  Oxy IR/ROXICODONE Take 0-1 tablets (0-5 mg total) by mouth every 6 (six) hours as needed for severe pain (postop pain).   polyethylene glycol packet Commonly known as:  MIRALAX / GLYCOLAX Take 17 g by mouth daily as needed for mild constipation.   sulfamethoxazole-trimethoprim 800-160 MG tablet Commonly known as:  BACTRIM DS,SEPTRA DS Take 1 tablet by mouth 2 (two) times daily for 7 days.            Discharge Care Instructions  (From admission, onward)         Start     Ordered   02/16/18 0000  Discharge wound care:    Comments:  Remove R IF dressing. Cleanse with running water/soap. Apply bacitracin ointment JUST to the open wound cleft and also over the maroon fingerpad skin. Redress with VERY light dressing (just enough to cover the ointment areas and also allow for finger movement)   02/16/18 1537          Disposition and follow-up:   Mr.Hunter [name] was discharged from [facility] in Stable condition.  At the hospital follow up visit please  address:  1.  Right finger cellulitis: - WBC 12.7->10.4->8.0 during admission. Please check CBC to ensure leukocytosis stayed resolved - D/ced on Bactrim 800-160mg . Wound culture show moderate MRSA pending final read. Please ensure patient has been taking his discharge antibiotics as prescribed and change his antibiotics if indicated when final read is done.  2. Hx of IVDU - Patient set up for appointment with suboxone clinic. Encourage patient to follow up to his visit. - HIV was negative. 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 Please connect patient to ID for curative treatment if appropriate  4.  Labs / imaging needed at time of follow-up: CBC  5.  Pending labs/ test needing follow-up: Wound culture  Follow-up Appointments: Follow-up Information    Mack Hook, MD. Go on 03/01/2018.   Specialty:  Orthopedic Surgery Why:  9:45 AM Contact information: [address] [place] [address] [phone]        Tyson Alias, MD Follow up on 02/22/2018.   Specialty:  Internal Medicine Why:  8:45 AM Contact information: [address] [place] [address] [phone]           Hospital Course by problem list: 1. 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  Presented to ED with fever and leukocytosis.  Blood culture collected.  Started  on Vanc and Rocephin.  Hand surgery took him for I&D on night of admission with wound kept open.  Wound culture collected.  2 days later wound was closed at bedside.  Leukocytosis resolved and vitals stabilized.  Wound culture prelim read showed staph aureus.  Discharged on 7-day course of Bactrim.  2.  Hx of IVDU: Has history of IV drug use.  States he has been off IV drug use for 6 months.  Patient requesting support and curving cravings and preventing relapse.  Discharged with follow-up at OUD clinic.  Discharge Vitals:   BP 125/85 (BP Location: Left  Arm)   Pulse 79   Temp 98.6 F (37 C) (Oral)   Resp 16   Ht 6' 0.99" (1.854 m)   Wt 81 kg   SpO2 100%   BMI 23.56 kg/m   Pertinent Labs, Studies, and Procedures:  CBC Latest Ref Rng & Units 02/16/2018 02/15/2018 02/14/2018  WBC 4.0 - 10.5 K/uL 8.0 10.4 12.7(H)  Hemoglobin 13.0 - 17.0 g/dL 8.1(X) 9.1(Y) 10.2(L)  Hematocrit 39.0 - 52.0 % 30.9(L) 29.7(L) 31.9(L)  Platelets 150 - 400 K/uL 218 232 216   Results for orders placed or performed during the hospital encounter of 02/14/18  Blood Culture (routine x 2)     Status: None (Preliminary result)   Collection Time: 02/14/18  1:20 PM  Result Value Ref Range Status   Specimen Description BLOOD LEFT ANTECUBITAL  Final   Special Requests   Final    BOTTLES DRAWN AEROBIC AND ANAEROBIC Blood Culture adequate volume   Culture   Final    NO GROWTH 3 DAYS Performed at [facility], [address], [place], [place] [address]    Report Status PENDING  Incomplete  Blood Culture (routine x 2)     Status: None (Preliminary result)   Collection Time: 02/14/18  1:27 PM  Result Value Ref Range Status   Specimen Description BLOOD LEFT FOREARM  Final   Special Requests   Final    BOTTLES DRAWN AEROBIC AND ANAEROBIC Blood Culture adequate volume   Culture   Final    NO GROWTH 3 DAYS Performed at [facility], [address] [place], [place] [address]    Report Status PENDING  Incomplete  Culture, blood (single) w Reflex to ID Panel     Status: None (Preliminary result)   Collection Time: 02/14/18  5:25 PM  Result Value Ref Range Status   Specimen Description BLOOD RIGHT FOREARM  Final   Special Requests   Final    BOTTLES DRAWN AEROBIC AND ANAEROBIC Blood Culture results may not be optimal due to an inadequate volume of blood received in culture bottles   Culture   Final    NO GROWTH 3 DAYS Performed at [facility], [address] [place], [place] [address]    Report Status PENDING  Incomplete  Aerobic/Anaerobic Culture  (surgical/deep wound)     Status: None (Preliminary result)   Collection Time: 02/14/18  9:44 PM  Result Value Ref Range Status   Specimen Description ABSCESS RIGHT FINGER  Final   Special Requests PT ON ANCEF  Final   Gram Stain   Final    MODERATE WBC PRESENT, PREDOMINANTLY PMN FEW GRAM POSITIVE COCCI Performed at [facility], [address] [place], [place] [address]    Culture   Final    MODERATE METHICILLIN RESISTANT STAPHYLOCOCCUS AUREUS NO ANAEROBES ISOLATED; CULTURE IN PROGRESS FOR 5 DAYS  Report Status PENDING  Incomplete   Organism ID, Bacteria METHICILLIN RESISTANT STAPHYLOCOCCUS AUREUS  Final      Susceptibility   Methicillin resistant staphylococcus aureus - MIC*    CIPROFLOXACIN <=0.5 SENSITIVE Sensitive     ERYTHROMYCIN >=8 RESISTANT Resistant     GENTAMICIN <=0.5 SENSITIVE Sensitive     OXACILLIN >=4 RESISTANT Resistant     TETRACYCLINE <=1 SENSITIVE Sensitive     VANCOMYCIN <=0.5 SENSITIVE Sensitive     TRIMETH/SULFA <=10 SENSITIVE Sensitive     CLINDAMYCIN >=8 RESISTANT Resistant     RIFAMPIN <=0.5 SENSITIVE Sensitive     Inducible Clindamycin NEGATIVE Sensitive     * MODERATE METHICILLIN RESISTANT STAPHYLOCOCCUS AUREUS  Surgical pcr screen     Status: Abnormal   Collection Time: 02/16/18  3:46 AM  Result Value Ref Range Status   MRSA, PCR POSITIVE (A) NEGATIVE Final    Comment: RESULT CALLED TO, READ BACK BY AND VERIFIED WITHForestine Na RN [phone] 02/16/18 A BROWNING    Staphylococcus aureus POSITIVE (A) NEGATIVE Final    Comment: (NOTE) The Xpert SA Assay (FDA approved for NASAL specimens in patients 4 years of age and older), is one component of a comprehensive surveillance program. It is not intended to diagnose infection nor to guide or monitor treatment. Performed at [facility], [address], [place], [place] [address]      Discharge Instructions:  Mr.Myrie  You came to the hospital with an infected finger. We started you  on antibiotics and our hand surgeon cleaned out your finger. Here are the recommendations for you at discharge:  START Bactrim 800-160mg  twice a day for 7 days We will prescribe you 1 week worth oxycodone for pain. Please only take them for severe pain.   For Wound Care:  Contact Dr. Janee Morn regarding any questions or concerns you may have regarding wound care, dressing changes, motion exercises, etc.  Do the wound care as was taught in the hospital and work to get your finger to move normally    Discharge Instructions    Call MD for:  difficulty breathing, headache or visual disturbances   Complete by:  As directed    Call MD for:  persistant dizziness or light-headedness   Complete by:  As directed    Call MD for:  persistant nausea and vomiting   Complete by:  As directed    Call MD for:  redness, tenderness, or signs of infection (pain, swelling, redness, odor or green/yellow discharge around incision site)   Complete by:  As directed    Call MD for:  severe uncontrolled pain   Complete by:  As directed    Call MD for:  temperature >100.4   Complete by:  As directed    Diet - low sodium heart healthy   Complete by:  As directed    Discharge wound care:   Complete by:  As directed    Remove R IF dressing. Cleanse with running water/soap. Apply bacitracin ointment JUST to the open wound cleft and also over the maroon fingerpad skin. Redress with VERY light dressing (just enough to cover the ointment areas and also allow for finger movement)   Increase activity slowly   Complete by:  As directed       Signed: Theotis Barrio, MD PGY1 02/17/2018, 4:49 PM   Pager: [phone]

## 2018-02-16 NOTE — Anesthesia Postprocedure Evaluation (Signed)
Anesthesia Post Note  Patient: Hunter [name] [facility]  Procedure(s) Performed: IRRIGATION AND DEBRIDEMENT RIGHT INDEX FINGER  (Right Finger)     Patient location during evaluation: PACU Anesthesia Type: General Level of consciousness: sedated and patient cooperative Pain management: pain level controlled Vital Signs Assessment: post-procedure vital signs reviewed and stable Respiratory status: spontaneous breathing Cardiovascular status: stable Anesthetic complications: no    Last Vitals:  Vitals:   02/15/18 1924 02/16/18 0330  BP: 129/82 125/85  Pulse: 88 79  Resp: 17 16  Temp: 37.2 C 37 C  SpO2: 99% 100%    Last Pain:  Vitals:   02/16/18 1349  TempSrc:   PainSc: 8                  Lewie Loron

## 2018-02-16 NOTE — Progress Notes (Signed)
R IF irrigated, debrided, and closed at bedside with digital block. Pt tolerated procedure well.  Wound care ordered and instructions reviewed with patient and his nurse. ROM exercises reviewed with patient, and OT ordered. NSAID started to help with post-traumatic inflammation.  Unless contra-indicated, this would be helpful for the next couple of weeks at least.  I have left Rx on chart for when patient is ready for d/c.  Patient needs to f/u with me regarding surgical issues (sutures, ROM, need for therapy, etc.) in approx 2 weeks. Please call me if any additional surgical concerns arise while he remains an inpatient.  This infection has been fairly severe for the digit.  ID expertise may be helpful in making decision regarding duration of IV antibiotics and criteria to use to determine when to switch to oral antibiotics.  Neil Crouch, MD Hand Surgery

## 2018-02-17 ENCOUNTER — Telehealth: Payer: Self-pay | Admitting: Internal Medicine

## 2018-02-17 NOTE — Telephone Encounter (Signed)
Attempted to call patient to let him know he can pick up his work letter for his recent hospitalization during his appt next week. Ex-wife picked up and informed me that he may be at his mother's house but was unable to provide a phone number.

## 2018-02-19 LAB — CULTURE, BLOOD (ROUTINE X 2)
CULTURE: NO GROWTH
Culture: NO GROWTH
SPECIAL REQUESTS: ADEQUATE
Special Requests: ADEQUATE

## 2018-02-19 LAB — CULTURE, BLOOD (SINGLE): Culture: NO GROWTH

## 2018-02-19 LAB — AEROBIC/ANAEROBIC CULTURE W GRAM STAIN (SURGICAL/DEEP WOUND)

## 2018-02-19 LAB — AEROBIC/ANAEROBIC CULTURE (SURGICAL/DEEP WOUND)

## 2018-02-21 ENCOUNTER — Emergency Department (HOSPITAL_COMMUNITY): Payer: Medicaid - Out of State

## 2018-02-21 ENCOUNTER — Inpatient Hospital Stay (HOSPITAL_COMMUNITY)
Admission: EM | Admit: 2018-02-21 | Discharge: 2018-02-25 | DRG: 981 | Disposition: A | Discharge status: Home / Self Care | Payer: Medicaid - Out of State | Attending: Family Medicine | Admitting: Family Medicine

## 2018-02-21 DIAGNOSIS — F1990 Other psychoactive substance use, unspecified, uncomplicated: Secondary | ICD-10-CM | POA: Diagnosis present

## 2018-02-21 DIAGNOSIS — D649 Anemia, unspecified: Secondary | ICD-10-CM | POA: Diagnosis present

## 2018-02-21 DIAGNOSIS — N179 Acute kidney failure, unspecified: Secondary | ICD-10-CM | POA: Diagnosis present

## 2018-02-21 DIAGNOSIS — F952 Tourette's disorder: Secondary | ICD-10-CM | POA: Diagnosis present

## 2018-02-21 DIAGNOSIS — F17210 Nicotine dependence, cigarettes, uncomplicated: Secondary | ICD-10-CM | POA: Diagnosis present

## 2018-02-21 DIAGNOSIS — R402242 Coma scale, best verbal response, confused conversation, at arrival to emergency department: Secondary | ICD-10-CM | POA: Diagnosis present

## 2018-02-21 DIAGNOSIS — R7989 Other specified abnormal findings of blood chemistry: Secondary | ICD-10-CM | POA: Diagnosis present

## 2018-02-21 DIAGNOSIS — Z791 Long term (current) use of non-steroidal anti-inflammatories (NSAID): Secondary | ICD-10-CM

## 2018-02-21 DIAGNOSIS — B182 Chronic viral hepatitis C: Secondary | ICD-10-CM | POA: Diagnosis present

## 2018-02-21 DIAGNOSIS — I493 Ventricular premature depolarization: Secondary | ICD-10-CM | POA: Diagnosis present

## 2018-02-21 DIAGNOSIS — L089 Local infection of the skin and subcutaneous tissue, unspecified: Secondary | ICD-10-CM

## 2018-02-21 DIAGNOSIS — L03113 Cellulitis of right upper limb: Principal | ICD-10-CM | POA: Diagnosis present

## 2018-02-21 DIAGNOSIS — R402142 Coma scale, eyes open, spontaneous, at arrival to emergency department: Secondary | ICD-10-CM | POA: Diagnosis present

## 2018-02-21 DIAGNOSIS — E869 Volume depletion, unspecified: Secondary | ICD-10-CM | POA: Diagnosis present

## 2018-02-21 DIAGNOSIS — F19159 Other psychoactive substance abuse with psychoactive substance-induced psychotic disorder, unspecified: Secondary | ICD-10-CM | POA: Diagnosis present

## 2018-02-21 DIAGNOSIS — G92 Toxic encephalopathy: Secondary | ICD-10-CM | POA: Diagnosis present

## 2018-02-21 DIAGNOSIS — Z781 Physical restraint status: Secondary | ICD-10-CM

## 2018-02-21 DIAGNOSIS — R451 Restlessness and agitation: Secondary | ICD-10-CM | POA: Diagnosis present

## 2018-02-21 DIAGNOSIS — M6282 Rhabdomyolysis: Secondary | ICD-10-CM

## 2018-02-21 DIAGNOSIS — F19121 Other psychoactive substance abuse with intoxication delirium: Secondary | ICD-10-CM | POA: Diagnosis present

## 2018-02-21 DIAGNOSIS — F1910 Other psychoactive substance abuse, uncomplicated: Secondary | ICD-10-CM

## 2018-02-21 DIAGNOSIS — Z729 Problem related to lifestyle, unspecified: Secondary | ICD-10-CM

## 2018-02-21 DIAGNOSIS — Z8614 Personal history of Methicillin resistant Staphylococcus aureus infection: Secondary | ICD-10-CM

## 2018-02-21 DIAGNOSIS — G929 Unspecified toxic encephalopathy: Secondary | ICD-10-CM | POA: Diagnosis present

## 2018-02-21 DIAGNOSIS — I96 Gangrene, not elsewhere classified: Secondary | ICD-10-CM | POA: Diagnosis present

## 2018-02-21 DIAGNOSIS — R45851 Suicidal ideations: Secondary | ICD-10-CM | POA: Diagnosis present

## 2018-02-21 DIAGNOSIS — R410 Disorientation, unspecified: Secondary | ICD-10-CM

## 2018-02-21 DIAGNOSIS — L02511 Cutaneous abscess of right hand: Secondary | ICD-10-CM | POA: Diagnosis present

## 2018-02-21 DIAGNOSIS — R945 Abnormal results of liver function studies: Secondary | ICD-10-CM | POA: Diagnosis present

## 2018-02-21 DIAGNOSIS — T6591XA Toxic effect of unspecified substance, accidental (unintentional), initial encounter: Secondary | ICD-10-CM | POA: Diagnosis present

## 2018-02-21 DIAGNOSIS — F1410 Cocaine abuse, uncomplicated: Secondary | ICD-10-CM

## 2018-02-21 DIAGNOSIS — L03011 Cellulitis of right finger: Secondary | ICD-10-CM | POA: Diagnosis present

## 2018-02-21 DIAGNOSIS — R402352 Coma scale, best motor response, localizes pain, at arrival to emergency department: Secondary | ICD-10-CM | POA: Diagnosis present

## 2018-02-21 DIAGNOSIS — M1990 Unspecified osteoarthritis, unspecified site: Secondary | ICD-10-CM | POA: Diagnosis present

## 2018-02-21 DIAGNOSIS — R000 Tachycardia, unspecified: Secondary | ICD-10-CM | POA: Diagnosis present

## 2018-02-21 LAB — COMPREHENSIVE METABOLIC PANEL
ALT: 41 U/L (ref 0–44)
AST: 67 U/L — AB (ref 15–41)
Albumin: 4.5 g/dL (ref 3.5–5.0)
Alkaline Phosphatase: 159 U/L — ABNORMAL HIGH (ref 38–126)
Anion gap: 17 — ABNORMAL HIGH (ref 5–15)
BUN: 26 mg/dL — ABNORMAL HIGH (ref 6–20)
CHLORIDE: 107 mmol/L (ref 98–111)
CO2: 24 mmol/L (ref 22–32)
Calcium: 10 mg/dL (ref 8.9–10.3)
Creatinine, Ser: 2.06 mg/dL — ABNORMAL HIGH (ref 0.61–1.24)
GFR calc non Af Amer: 41 mL/min — ABNORMAL LOW (ref >60)
GFR, EST AFRICAN AMERICAN: 48 mL/min — AB (ref >60)
Glucose, Bld: 83 mg/dL (ref 70–99)
POTASSIUM: 3.6 mmol/L (ref 3.5–5.1)
SODIUM: 148 mmol/L — AB (ref 135–145)
Total Bilirubin: 1.4 mg/dL — ABNORMAL HIGH (ref 0.3–1.2)
Total Protein: 9.5 g/dL — ABNORMAL HIGH (ref 6.5–8.1)

## 2018-02-21 LAB — CBG MONITORING, ED
Glucose-Capillary: 61 mg/dL — ABNORMAL LOW (ref 70–99)
Glucose-Capillary: 82 mg/dL (ref 70–99)

## 2018-02-21 LAB — ACETAMINOPHEN LEVEL: Acetaminophen (Tylenol), Serum: <10 ug/mL — ABNORMAL LOW (ref 10–30)

## 2018-02-21 LAB — SALICYLATE LEVEL: Salicylate Lvl: <7 mg/dL (ref 2.8–30.0)

## 2018-02-21 LAB — ETHANOL: Alcohol, Ethyl (B): <10 mg/dL (ref <10)

## 2018-02-21 MED ORDER — KETAMINE HCL 50 MG/ML IJ SOLN: 4.0000 mg/kg | Freq: Once | INTRAMUSCULAR | Status: DC

## 2018-02-21 MED ORDER — CLINDAMYCIN PHOSPHATE 600 MG/50ML IV SOLN: 600.0000 mg | Freq: Once | INTRAVENOUS | Status: DC

## 2018-02-21 MED ORDER — LORAZEPAM 2 MG/ML IJ SOLN
2.0000 mg | Freq: Once | INTRAMUSCULAR | Status: AC
  Administered 2018-02-21: 2 mg via INTRAVENOUS

## 2018-02-21 MED ORDER — VANCOMYCIN HCL 10 G IV SOLR
1500.0000 mg | Freq: Once | INTRAVENOUS | Status: AC
  Administered 2018-02-21: 1500 mg via INTRAVENOUS
  Filled 2018-02-21: qty 1500

## 2018-02-21 MED ORDER — ZIPRASIDONE MESYLATE 20 MG IM SOLR
INTRAMUSCULAR | Status: AC
  Administered 2018-02-21: 20 mg
  Filled 2018-02-21: qty 20

## 2018-02-21 MED ORDER — STERILE WATER FOR INJECTION IJ SOLN
INTRAMUSCULAR | Status: AC
  Filled 2018-02-21: qty 10

## 2018-02-21 MED ORDER — KETAMINE HCL 50 MG/ML IJ SOLN
4.0000 mg/kg | Freq: Once | INTRAMUSCULAR | Status: DC
  Filled 2018-02-21: qty 10

## 2018-02-21 MED ORDER — SODIUM CHLORIDE 0.9 % IV BOLUS
1000.0000 mL | Freq: Once | INTRAVENOUS | Status: AC
  Administered 2018-02-21: 1000 mL via INTRAVENOUS

## 2018-02-21 MED ORDER — SODIUM CHLORIDE 0.9 % IV SOLN
Freq: Once | INTRAVENOUS | Status: AC
  Administered 2018-02-21: via INTRAVENOUS

## 2018-02-21 MED ORDER — LORAZEPAM 2 MG/ML IJ SOLN
INTRAMUSCULAR | Status: AC
  Administered 2018-02-21: 1 mg via INTRAVENOUS
  Filled 2018-02-21: qty 1

## 2018-02-21 MED ORDER — LORAZEPAM 2 MG/ML IJ SOLN
INTRAMUSCULAR | Status: AC
  Filled 2018-02-21: qty 1

## 2018-02-21 MED ORDER — LORAZEPAM 2 MG/ML IJ SOLN
2.0000 mg | Freq: Once | INTRAMUSCULAR | Status: DC
  Filled 2018-02-21: qty 1

## 2018-02-21 NOTE — ED Notes (Signed)
Patient remains cursing and agitated-kicking legs and grabbing staff-remains restrained x 4 limbs with gurney cuffs

## 2018-02-21 NOTE — Progress Notes (Signed)
A consult was received from an ED provider for vancomycin per pharmacy dosing.  The patient's profile has been reviewed for ht/wt/allergies/indication/available labs.    A one time order has been placed for vancomycin 1500 mg IV.  Further antibiotics/pharmacy consults should be ordered by admitting physician if indicated.                       Thank you, Cindi CarbonMary M Tamel Abel, PharmD, BCPS Clinical Pharmacist 02/21/2018  9:03 PM

## 2018-02-21 NOTE — ED Notes (Signed)
ED Provider at bedside. 

## 2018-02-21 NOTE — ED Provider Notes (Signed)
[facility]-EMERGENCY DEPT Provider Note   CSN: [id number] Arrival date & time: 02/21/18  1816     History   Chief Complaint No chief complaint on file.  Level 5 caveat due to altered mental status HPI Hunter [name] is a 32 y.o. male with history of chronic hepatitis and IV drug use as well as Tourette's presents under IVC.  [employer] [name] that he was found on the third floor of a hotel threatening to jump.  He was acting erratically, frequent jerking movements and not making any sense.  Woman that was in a hotel room with him stated that she "did not know him like that "but that "someone must have slipped him something ".  He is not answering questions or following commands. Requiring multiple police officers to restrain him.   Of note, recently discharged from the hospital on 02/16/2018 for right finger cellulitis.  During his hospital stay he underwent I&D with Dr. Janee Morn in 2 days later the wound was closed at bedside.  The history is provided by the patient.    Past Medical History:  Diagnosis Date  . Chronic hepatitis C without hepatic coma (HCC) 04/28/2017  . Hepatitis-C   . IVDU (intravenous drug user)   . Tourette's     Patient Active Problem List   Diagnosis Date Noted  . Toxic encephalopathy 02/22/2018  . Cellulitis and abscess of hand 02/14/2018  . Cellulitis of right hand   . Abscess of right index finger   . Atelectasis of both lungs   . Chest wall abscess   . Psoas abscess (HCC) 05/23/2017  . Bacteremia due to methicillin susceptible Staphylococcus aureus (MSSA) 05/23/2017  . Discitis of lumbar region 05/22/2017  . Hypokalemia 05/22/2017  . Normocytic anemia 04/28/2017  . Chronic hepatitis C without hepatic coma (HCC) 04/28/2017  . IVDU (intravenous drug user) 04/27/2017  . Sepsis (HCC) 04/26/2017    Past Surgical History:  Procedure Laterality Date  . I&D EXTREMITY Right 04/26/2017   Procedure: IRRIGATION AND DEBRIDEMENT RIGHT  HAND AND FOREARM;  Surgeon: Bradly Bienenstock, MD;  Location: [facility] OR;  Service: Orthopedics;  Laterality: Right;  . I&D EXTREMITY Right 02/14/2018   Procedure: IRRIGATION AND DEBRIDEMENT RIGHT INDEX FINGER ;  Surgeon: Mack Hook, MD;  Location: [facility] OR;  Service: Orthopedics;  Laterality: Right;  . TEE WITHOUT CARDIOVERSION N/A 04/30/2017   Procedure: TRANSESOPHAGEAL ECHOCARDIOGRAM (TEE);  Surgeon: Quintella Reichert, MD;  Location: [facility] ENDOSCOPY;  Service: Cardiovascular;  Laterality: N/A;  . TEE WITHOUT CARDIOVERSION N/A 05/28/2017   Procedure: TRANSESOPHAGEAL ECHOCARDIOGRAM (TEE);  Surgeon: Pricilla Riffle, MD;  Location: [facility] ENDOSCOPY;  Service: Cardiovascular;  Laterality: N/A;  . TONSILLECTOMY          Home Medications    Prior to Admission medications   Medication Sig Start Date End Date Taking? Authorizing Provider  acetaminophen (TYLENOL) 325 MG tablet Take 2 tablets (650 mg total) by mouth every 6 (six) hours as needed for mild pain (or Fever >/= 101). Patient not taking: Reported on 05/22/2017 05/01/17   Standley Brooking, MD  bacitracin ointment Apply topically 2 (two) times daily. 02/16/18   Theotis Barrio, MD  naproxen (NAPROSYN) 500 MG tablet Take 1 tablet (500 mg total) by mouth 2 (two) times daily with a meal. 02/16/18   Mack Hook, MD  oxyCODONE (ROXICODONE) 5 MG immediate release tablet Take 0-1 tablets (0-5 mg total) by mouth every 6 (six) hours as needed for severe pain (postop pain). 02/16/18  Mack Hook, MD  polyethylene glycol [facility] Ethelene Hal) packet Take 17 g by mouth daily as needed for mild constipation. 02/16/18   Theotis Barrio, MD  sulfamethoxazole-trimethoprim (BACTRIM DS,SEPTRA DS) 800-160 MG tablet Take 1 tablet by mouth 2 (two) times daily for 7 days. 02/16/18 02/23/18  Theotis Barrio, MD    Family History No family history on file.  Social History Social History   Tobacco Use  . Smoking status: Current Every Day Smoker    Types: Cigarettes  .  Smokeless tobacco: Never Used  Substance Use Topics  . Alcohol use: No    Frequency: Never  . Drug use: Yes    Types: IV    Comment: heroin     Allergies   Patient has no known allergies.   Review of Systems Review of Systems  Unable to perform ROS: Mental status change     Physical Exam Updated Vital Signs BP 109/86   Pulse 96   Resp 10   SpO2 99%   Physical Exam  Constitutional: He appears well-nourished. No distress.  Thin, writhing around in bed, very diaphoretic  HENT:  Head: Normocephalic and atraumatic.  Eyes: Pupils are equal, round, and reactive to light. Conjunctivae are normal. Right eye exhibits no discharge. Left eye exhibits no discharge.  Neck: No JVD present. No tracheal deviation present.  Cardiovascular: Regular rhythm and intact distal pulses.  tachycardic  Pulmonary/Chest: Effort normal and breath sounds normal.  Abdominal: Soft. Bowel sounds are normal. He exhibits no distension. There is no guarding.  Musculoskeletal: He exhibits no edema.  Right index finger swollen.  Sutures in place under significant tension with observable tissue extending from wound.  Palmar aspect of digit with ulceration and necrosis.   Neurological: He is alert. He has normal strength. GCS eye subscore is 4. GCS verbal subscore is 4. GCS motor subscore is 5.  Skin: He is diaphoretic. No erythema.  Psychiatric: He has a normal mood and affect. His behavior is normal.  Nursing note and vitals reviewed.        ED Treatments / Results  Labs (all labs ordered are listed, but only abnormal results are displayed) Labs Reviewed  CBC WITH DIFFERENTIAL/PLATELET - Abnormal; Notable for the following components:      Result Value   WBC 34.3 (*)    HCT 38.9 (*)    Platelets 605 (*)    Monocytes Absolute 1.4 (*)    All other components within normal limits  ACETAMINOPHEN LEVEL - Abnormal; Notable for the following components:   Acetaminophen (Tylenol), Serum <10 (*)     All other components within normal limits  COMPREHENSIVE METABOLIC PANEL - Abnormal; Notable for the following components:   Sodium 148 (*)    BUN 26 (*)    Creatinine, Ser 2.06 (*)    Total Protein 9.5 (*)    AST 67 (*)    Alkaline Phosphatase 159 (*)    Total Bilirubin 1.4 (*)    GFR calc non Af Amer 41 (*)    GFR calc Af Amer 48 (*)    Anion gap 17 (*)    All other components within normal limits  CBG MONITORING, ED - Abnormal; Notable for the following components:   Glucose-Capillary 61 (*)    All other components within normal limits  CULTURE, BLOOD (ROUTINE X 2)  CULTURE, BLOOD (ROUTINE X 2)  ETHANOL  SALICYLATE LEVEL  RAPID URINE DRUG SCREEN, HOSP PERFORMED  URINALYSIS, ROUTINE W REFLEX MICROSCOPIC  BASIC METABOLIC PANEL  CBG MONITORING, ED  CBG MONITORING, ED    EKG EKG Interpretation  Date/Time:  Monday February 21 2018 19:36:11 EDT Ventricular Rate:  140 PR Interval:    QRS Duration: 84 QT Interval:  306 QTC Calculation: 467 R Axis:   73 Text Interpretation:  Sinus tachycardia Ventricular premature complex Consider right atrial enlargement Confirmed by Virgina Norfolk [phone]) on 02/21/2018 7:39:43 PM   Radiology Ct Head Wo Contrast  Result Date: 02/21/2018 CLINICAL DATA:  Altered LOC EXAM: CT HEAD WITHOUT CONTRAST TECHNIQUE: Contiguous axial images were obtained from the base of the skull through the vertex without intravenous contrast. COMPARISON:  None. FINDINGS: Brain: No evidence of acute infarction, hemorrhage, hydrocephalus, extra-axial collection or mass lesion/mass effect. Vascular: No hyperdense vessel or unexpected calcification. Skull: Normal. Negative for fracture or focal lesion. Sinuses/Orbits: No acute finding. Other: None IMPRESSION: Negative non contrasted CT appearance of the brain. Electronically Signed   By: Jasmine Pang M.D.   On: 02/21/2018 21:12   Dg Hand Complete Right  Result Date: 02/21/2018 CLINICAL DATA:  32 year old male with large  2nd finger laceration. EXAM: RIGHT HAND - COMPLETE 3+ VIEW COMPARISON:  Right hand series 02/14/2018. FINDINGS: New soft tissue wound along the radial aspect of the right 2nd finger where previously moderate to severe generalized finger soft tissue swelling was noted. No radiopaque foreign body identified. Underlying osseous structures appear stable. Chronic, healed 5th metacarpal fracture. Otherwise no fracture, dislocation, or arthropathic changes identified about the right hand. IMPRESSION: 1. Soft tissue wound along the radial aspect of the right index finger. No radiopaque foreign body identified. 2. No acute osseous abnormality identified. Healed chronic 5th metacarpal fracture. Electronically Signed   By: Odessa Fleming M.D.   On: 02/21/2018 21:00    Procedures Procedures (including critical care time)  Medications Ordered in ED Medications  sterile water (preservative free) injection (has no administration in time range)  ketamine (KETALAR) injection 325 mg (0 mg Intramuscular Hold 02/21/18 2139)  vancomycin (VANCOCIN) 1,500 mg in sodium chloride 0.9 % 500 mL IVPB (1,500 mg Intravenous New Bag/Given 02/21/18 2337)  0.9 %  sodium chloride infusion (has no administration in time range)  ziprasidone (GEODON) 20 MG injection (20 mg  Given 02/21/18 1740)  sodium chloride 0.9 % bolus 1,000 mL (0 mLs Intravenous Stopped 02/21/18 2227)  LORazepam (ATIVAN) injection 2 mg (2 mg Intravenous Given 02/21/18 2016)  0.9 %  sodium chloride infusion ( Intravenous New Bag/Given 02/21/18 2338)  LORazepam (ATIVAN) injection 1 mg (1 mg Intravenous Given 02/22/18 0013)  LORazepam (ATIVAN) injection 1 mg (1 mg Intravenous Given 02/21/18 2349)     Initial Impression / Assessment and Plan / ED Course  I have reviewed the triage vital signs and the nursing notes.  Pertinent labs & imaging results that were available during my care of the patient were reviewed by me and considered in my medical decision making (see chart for  details).     Patient presents to the ED under IVC in a state of agitated delirium. He is tachycardic on initial examination and diaphoretic. He required physical and chemical restraints on initial assessment.  Suspect substance-induced delirium.  Head CT shows no acute intracranial abnormality.  EKG shows sinus tachycardia.  Lab work shows leukocytosis of 34.3, AKI with creatinine of 2.06. May be secondary to his delirium, although he does have a right index finger infection.  He is 7 days status post I&D with Dr. Janee Morn of [facility] orthopedics.  X-ray  shows soft tissue wound but no acute osseous abnormality, no evidence of osteomyelitis.  He was given IV vancomycin for MRSA colonized wound infection in the ED. Spoke with Jason CoopKayla McKenzie, PA with [facility] who will inform Dr. Janee Mornhompson of patient's condition and will follow him while he is in the hospital. Spoke with Dr. Evelene CroonSantos with internal medicine teaching service who agrees to assume care of patient and bring him into the hospital for further evaluation and management.  [facility] 11:34PM Spoke with Dr. Evelene CroonSantos.  There are no stepdown or MedSurg beds available [facility].  On reassessment, patient is awake, protecting his airway.  He appears anxious but far less agitated than he was on initial presentation.  He is answering questions appropriately and is easily redirectable.  He is oriented to person and place but not time.  He tells me that he did not take any illicit substances today.  He tells me he is not suicidal or homicidal.  He repeatedly requests something to drink and that we remove the Foley catheter that was placed just prior to my reassessment. Dr. Julian ReilGardner to see and admit patient to the SDU. Hand surgery to see patient in the morning. Likely requires OR washout. Patient seen and evaluated by Dr. Lockie Molauratolo who agrees with assessment and plan at this time.  Awaiting UDS. Final Clinical  Impressions(s) / ED Diagnoses   Final diagnoses:  Acute delirium  Finger infection    ED Discharge Orders    None       Jeanie SewerFawze, Braxley Balandran A, PA-C 02/22/18 0044    Virgina Norfolkuratolo, Adam, DO 02/22/18 0208

## 2018-02-21 NOTE — ED Notes (Signed)
Bed: [id number] Expected date:  Expected time:  Means of arrival:  Comments: Combative IVC

## 2018-02-22 ENCOUNTER — Encounter (HOSPITAL_COMMUNITY): Admission: EM | Disposition: A | Discharge status: Home / Self Care | Payer: Self-pay | Attending: Family Medicine

## 2018-02-22 ENCOUNTER — Other Ambulatory Visit: Payer: Self-pay

## 2018-02-22 ENCOUNTER — Observation Stay (HOSPITAL_COMMUNITY): Payer: Medicaid - Out of State | Admitting: Certified Registered"

## 2018-02-22 ENCOUNTER — Encounter (HOSPITAL_COMMUNITY): Payer: Self-pay

## 2018-02-22 DIAGNOSIS — F1990 Other psychoactive substance use, unspecified, uncomplicated: Secondary | ICD-10-CM

## 2018-02-22 DIAGNOSIS — R402142 Coma scale, eyes open, spontaneous, at arrival to emergency department: Secondary | ICD-10-CM | POA: Diagnosis present

## 2018-02-22 DIAGNOSIS — R402242 Coma scale, best verbal response, confused conversation, at arrival to emergency department: Secondary | ICD-10-CM | POA: Diagnosis present

## 2018-02-22 DIAGNOSIS — F17210 Nicotine dependence, cigarettes, uncomplicated: Secondary | ICD-10-CM | POA: Diagnosis present

## 2018-02-22 DIAGNOSIS — R451 Restlessness and agitation: Secondary | ICD-10-CM | POA: Diagnosis present

## 2018-02-22 DIAGNOSIS — I96 Gangrene, not elsewhere classified: Secondary | ICD-10-CM | POA: Diagnosis present

## 2018-02-22 DIAGNOSIS — T6591XA Toxic effect of unspecified substance, accidental (unintentional), initial encounter: Secondary | ICD-10-CM | POA: Diagnosis present

## 2018-02-22 DIAGNOSIS — R402352 Coma scale, best motor response, localizes pain, at arrival to emergency department: Secondary | ICD-10-CM | POA: Diagnosis present

## 2018-02-22 DIAGNOSIS — F1410 Cocaine abuse, uncomplicated: Secondary | ICD-10-CM | POA: Diagnosis not present

## 2018-02-22 DIAGNOSIS — L02511 Cutaneous abscess of right hand: Secondary | ICD-10-CM | POA: Diagnosis present

## 2018-02-22 DIAGNOSIS — M6282 Rhabdomyolysis: Secondary | ICD-10-CM | POA: Diagnosis present

## 2018-02-22 DIAGNOSIS — E869 Volume depletion, unspecified: Secondary | ICD-10-CM | POA: Diagnosis present

## 2018-02-22 DIAGNOSIS — B182 Chronic viral hepatitis C: Secondary | ICD-10-CM | POA: Diagnosis present

## 2018-02-22 DIAGNOSIS — L089 Local infection of the skin and subcutaneous tissue, unspecified: Secondary | ICD-10-CM

## 2018-02-22 DIAGNOSIS — R410 Disorientation, unspecified: Secondary | ICD-10-CM | POA: Diagnosis present

## 2018-02-22 DIAGNOSIS — L03113 Cellulitis of right upper limb: Secondary | ICD-10-CM | POA: Diagnosis present

## 2018-02-22 DIAGNOSIS — F19121 Other psychoactive substance abuse with intoxication delirium: Secondary | ICD-10-CM | POA: Diagnosis present

## 2018-02-22 DIAGNOSIS — Z781 Physical restraint status: Secondary | ICD-10-CM | POA: Diagnosis not present

## 2018-02-22 DIAGNOSIS — I493 Ventricular premature depolarization: Secondary | ICD-10-CM | POA: Diagnosis present

## 2018-02-22 DIAGNOSIS — R45851 Suicidal ideations: Secondary | ICD-10-CM | POA: Diagnosis present

## 2018-02-22 DIAGNOSIS — N179 Acute kidney failure, unspecified: Secondary | ICD-10-CM | POA: Diagnosis present

## 2018-02-22 DIAGNOSIS — F952 Tourette's disorder: Secondary | ICD-10-CM | POA: Diagnosis present

## 2018-02-22 DIAGNOSIS — F1910 Other psychoactive substance abuse, uncomplicated: Secondary | ICD-10-CM | POA: Diagnosis not present

## 2018-02-22 DIAGNOSIS — G92 Toxic encephalopathy: Secondary | ICD-10-CM | POA: Diagnosis present

## 2018-02-22 DIAGNOSIS — G929 Unspecified toxic encephalopathy: Secondary | ICD-10-CM | POA: Diagnosis present

## 2018-02-22 DIAGNOSIS — F19159 Other psychoactive substance abuse with psychoactive substance-induced psychotic disorder, unspecified: Secondary | ICD-10-CM | POA: Diagnosis present

## 2018-02-22 DIAGNOSIS — R945 Abnormal results of liver function studies: Secondary | ICD-10-CM | POA: Diagnosis present

## 2018-02-22 DIAGNOSIS — R000 Tachycardia, unspecified: Secondary | ICD-10-CM | POA: Diagnosis present

## 2018-02-22 DIAGNOSIS — L03011 Cellulitis of right finger: Secondary | ICD-10-CM | POA: Diagnosis present

## 2018-02-22 DIAGNOSIS — R7989 Other specified abnormal findings of blood chemistry: Secondary | ICD-10-CM | POA: Diagnosis present

## 2018-02-22 HISTORY — PX: I&D EXTREMITY: SHX5045

## 2018-02-22 LAB — URINALYSIS, ROUTINE W REFLEX MICROSCOPIC
Bilirubin Urine: NEGATIVE
Glucose, UA: NEGATIVE mg/dL
Ketones, ur: 5 mg/dL — AB
Leukocytes, UA: NEGATIVE
Nitrite: NEGATIVE
Protein, ur: 100 mg/dL — AB
Specific Gravity, Urine: 1.023 (ref 1.005–1.030)
pH: 5 (ref 5.0–8.0)

## 2018-02-22 LAB — CK: Total CK: 2006 U/L — ABNORMAL HIGH (ref 49–397)

## 2018-02-22 LAB — BASIC METABOLIC PANEL
Anion gap: 11 (ref 5–15)
BUN: 35 mg/dL — ABNORMAL HIGH (ref 6–20)
CO2: 23 mmol/L (ref 22–32)
Calcium: 8.6 mg/dL — ABNORMAL LOW (ref 8.9–10.3)
Chloride: 113 mmol/L — ABNORMAL HIGH (ref 98–111)
Creatinine, Ser: 1.61 mg/dL — ABNORMAL HIGH (ref 0.61–1.24)
GFR, EST NON AFRICAN AMERICAN: 56 mL/min — AB (ref >60)
Glucose, Bld: 91 mg/dL (ref 70–99)
POTASSIUM: 4 mmol/L (ref 3.5–5.1)
SODIUM: 147 mmol/L — AB (ref 135–145)

## 2018-02-22 LAB — RAPID URINE DRUG SCREEN, HOSP PERFORMED
Amphetamines: POSITIVE — AB
Barbiturates: NOT DETECTED
Benzodiazepines: NOT DETECTED
Cocaine: POSITIVE — AB
Opiates: POSITIVE — AB
Tetrahydrocannabinol: NOT DETECTED

## 2018-02-22 SURGERY — Surgical Case
Anesthesia: *Unknown

## 2018-02-22 SURGERY — IRRIGATION AND DEBRIDEMENT EXTREMITY
Anesthesia: General | Laterality: Right

## 2018-02-22 MED ORDER — LABETALOL HCL 5 MG/ML IV SOLN
INTRAVENOUS | Status: AC
  Filled 2018-02-22: qty 4

## 2018-02-22 MED ORDER — BACITRACIN ZINC 500 UNIT/GM EX OINT
TOPICAL_OINTMENT | Freq: Two times a day (BID) | CUTANEOUS | Status: DC
  Administered 2018-02-23 – 2018-02-24 (×3): via TOPICAL
  Administered 2018-02-25: 1 via TOPICAL
  Administered 2018-02-25: 11:00:00 via TOPICAL
  Filled 2018-02-22: qty 28.35
  Filled 2018-02-22: qty 0.9
  Filled 2018-02-22: qty 28.35
  Filled 2018-02-22: qty 0.9

## 2018-02-22 MED ORDER — OXYCODONE HCL 5 MG/5ML PO SOLN
5.0000 mg | Freq: Once | ORAL | Status: DC | PRN
  Filled 2018-02-22: qty 5

## 2018-02-22 MED ORDER — LIDOCAINE 2% (20 MG/ML) 5 ML SYRINGE
INTRAMUSCULAR | Status: AC
  Filled 2018-02-22: qty 5

## 2018-02-22 MED ORDER — ONDANSETRON HCL 4 MG/2ML IJ SOLN
INTRAMUSCULAR | Status: DC | PRN
  Administered 2018-02-22: 4 mg via INTRAVENOUS

## 2018-02-22 MED ORDER — SODIUM CHLORIDE 0.9 % IR SOLN
Status: DC | PRN
  Administered 2018-02-22: 1000 mL

## 2018-02-22 MED ORDER — LACTATED RINGERS IV SOLN
INTRAVENOUS | Status: DC | PRN
  Administered 2018-02-22: 16:00:00 via INTRAVENOUS

## 2018-02-22 MED ORDER — ONDANSETRON HCL 4 MG/2ML IJ SOLN
4.0000 mg | Freq: Four times a day (QID) | INTRAMUSCULAR | Status: DC | PRN
  Filled 2018-02-22: qty 2

## 2018-02-22 MED ORDER — LORAZEPAM 2 MG/ML IJ SOLN: 2.0000 mg | INTRAMUSCULAR | Status: DC | PRN

## 2018-02-22 MED ORDER — MIDAZOLAM HCL 2 MG/2ML IJ SOLN
INTRAMUSCULAR | Status: AC
  Filled 2018-02-22: qty 2

## 2018-02-22 MED ORDER — PROPOFOL 10 MG/ML IV BOLUS
INTRAVENOUS | Status: AC
  Filled 2018-02-22: qty 20

## 2018-02-22 MED ORDER — PROMETHAZINE HCL 25 MG/ML IJ SOLN: 6.2500 mg | INTRAMUSCULAR | Status: DC | PRN

## 2018-02-22 MED ORDER — EPHEDRINE 5 MG/ML INJ
INTRAVENOUS | Status: AC
  Filled 2018-02-22: qty 10

## 2018-02-22 MED ORDER — BACITRACIN ZINC 500 UNIT/GM EX OINT
TOPICAL_OINTMENT | CUTANEOUS | Status: AC
  Filled 2018-02-22: qty 28.35

## 2018-02-22 MED ORDER — KETAMINE HCL 10 MG/ML IJ SOLN
INTRAMUSCULAR | Status: AC
  Filled 2018-02-22: qty 1

## 2018-02-22 MED ORDER — SUCCINYLCHOLINE CHLORIDE 200 MG/10ML IV SOSY
PREFILLED_SYRINGE | INTRAVENOUS | Status: DC | PRN
  Administered 2018-02-22: 120 mg via INTRAVENOUS

## 2018-02-22 MED ORDER — LORAZEPAM 2 MG/ML IJ SOLN
1.0000 mg | Freq: Once | INTRAMUSCULAR | Status: AC
  Administered 2018-02-21: 1 mg via INTRAVENOUS

## 2018-02-22 MED ORDER — VANCOMYCIN HCL 10 G IV SOLR
1250.0000 mg | INTRAVENOUS | Status: DC
  Administered 2018-02-22: 1250 mg via INTRAVENOUS
  Filled 2018-02-22: qty 1250

## 2018-02-22 MED ORDER — LORAZEPAM 2 MG/ML IJ SOLN
2.0000 mg | Freq: Once | INTRAMUSCULAR | Status: AC
  Administered 2018-02-22: 2 mg via INTRAVENOUS

## 2018-02-22 MED ORDER — LORAZEPAM 2 MG/ML IJ SOLN
1.0000 mg | INTRAMUSCULAR | Status: DC | PRN
  Administered 2018-02-22 (×2): 2 mg via INTRAVENOUS
  Filled 2018-02-22 (×3): qty 1

## 2018-02-22 MED ORDER — PROPOFOL 10 MG/ML IV BOLUS
INTRAVENOUS | Status: DC | PRN
  Administered 2018-02-22: 200 mg via INTRAVENOUS

## 2018-02-22 MED ORDER — LORAZEPAM 2 MG/ML IJ SOLN
INTRAMUSCULAR | Status: AC
  Administered 2018-02-22: 2 mg via INTRAVENOUS
  Filled 2018-02-22: qty 1

## 2018-02-22 MED ORDER — SODIUM CHLORIDE 0.9 % IV SOLN
INTRAVENOUS | Status: DC
  Administered 2018-02-22 – 2018-02-23 (×3): via INTRAVENOUS

## 2018-02-22 MED ORDER — ACETAMINOPHEN 325 MG PO TABS
650.0000 mg | ORAL_TABLET | Freq: Four times a day (QID) | ORAL | Status: DC | PRN
  Administered 2018-02-25 (×2): 650 mg via ORAL
  Filled 2018-02-22 (×2): qty 2

## 2018-02-22 MED ORDER — OXYCODONE HCL 5 MG PO TABS: 5.0000 mg | ORAL_TABLET | Freq: Once | ORAL | Status: DC | PRN

## 2018-02-22 MED ORDER — HYDROMORPHONE HCL 1 MG/ML IJ SOLN: 0.2500 mg | INTRAMUSCULAR | Status: DC | PRN

## 2018-02-22 MED ORDER — DEXMEDETOMIDINE HCL IN NACL 400 MCG/100ML IV SOLN
0.4000 ug/kg/h | INTRAVENOUS | Status: DC
  Administered 2018-02-22 – 2018-02-23 (×3): 1.7 ug/kg/h via INTRAVENOUS
  Filled 2018-02-22 (×4): qty 100

## 2018-02-22 MED ORDER — LORAZEPAM 2 MG/ML IJ SOLN: 2.0000 mg | Freq: Two times a day (BID) | INTRAMUSCULAR | Status: DC | PRN

## 2018-02-22 MED ORDER — HALOPERIDOL LACTATE 5 MG/ML IJ SOLN
5.0000 mg | INTRAMUSCULAR | Status: DC | PRN
  Administered 2018-02-22 – 2018-02-25 (×3): 5 mg via INTRAVENOUS
  Filled 2018-02-22 (×5): qty 1

## 2018-02-22 MED ORDER — DEXAMETHASONE SODIUM PHOSPHATE 10 MG/ML IJ SOLN
INTRAMUSCULAR | Status: AC
  Filled 2018-02-22: qty 1

## 2018-02-22 MED ORDER — ONDANSETRON HCL 4 MG/2ML IJ SOLN
INTRAMUSCULAR | Status: AC
  Filled 2018-02-22: qty 2

## 2018-02-22 MED ORDER — FENTANYL CITRATE (PF) 250 MCG/5ML IJ SOLN
INTRAMUSCULAR | Status: AC
  Filled 2018-02-22: qty 5

## 2018-02-22 MED ORDER — LORAZEPAM 2 MG/ML IJ SOLN
2.0000 mg | INTRAMUSCULAR | Status: DC | PRN
  Administered 2018-02-22 – 2018-02-25 (×6): 2 mg via INTRAVENOUS
  Filled 2018-02-22 (×6): qty 1

## 2018-02-22 MED ORDER — DEXMEDETOMIDINE HCL IN NACL 200 MCG/50ML IV SOLN: 0.4000 ug/kg/h | INTRAVENOUS | Status: DC

## 2018-02-22 MED ORDER — ONDANSETRON HCL 4 MG PO TABS: 4.0000 mg | ORAL_TABLET | Freq: Four times a day (QID) | ORAL | Status: DC | PRN

## 2018-02-22 MED ORDER — HALOPERIDOL LACTATE 5 MG/ML IJ SOLN
2.0000 mg | INTRAMUSCULAR | Status: DC | PRN
  Administered 2018-02-22: 2 mg via INTRAVENOUS
  Filled 2018-02-22 (×2): qty 1

## 2018-02-22 MED ORDER — DEXMEDETOMIDINE HCL IN NACL 200 MCG/50ML IV SOLN
0.4000 ug/kg/h | INTRAVENOUS | Status: DC
  Administered 2018-02-22: 0.4 ug/kg/h via INTRAVENOUS
  Filled 2018-02-22 (×2): qty 50

## 2018-02-22 MED ORDER — SUCCINYLCHOLINE CHLORIDE 200 MG/10ML IV SOSY
PREFILLED_SYRINGE | INTRAVENOUS | Status: AC
  Filled 2018-02-22: qty 10

## 2018-02-22 MED ORDER — HEPARIN SODIUM (PORCINE) 5000 UNIT/ML IJ SOLN
5000.0000 [IU] | Freq: Three times a day (TID) | INTRAMUSCULAR | Status: DC
  Administered 2018-02-23 – 2018-02-25 (×8): 5000 [IU] via SUBCUTANEOUS
  Filled 2018-02-22 (×8): qty 1

## 2018-02-22 MED ORDER — LORAZEPAM 2 MG/ML IJ SOLN
1.0000 mg | Freq: Once | INTRAMUSCULAR | Status: AC
  Administered 2018-02-22: 1 mg via INTRAVENOUS
  Filled 2018-02-22: qty 1

## 2018-02-22 MED ORDER — ACETAMINOPHEN 650 MG RE SUPP: 650.0000 mg | Freq: Four times a day (QID) | RECTAL | Status: DC | PRN

## 2018-02-22 MED ORDER — HALOPERIDOL LACTATE 5 MG/ML IJ SOLN
5.0000 mg | Freq: Once | INTRAMUSCULAR | Status: AC
  Administered 2018-02-22: 5 mg via INTRAVENOUS
  Filled 2018-02-22: qty 1

## 2018-02-22 SURGICAL SUPPLY — 38 items
BAG ZIPLOCK 12X15 (MISCELLANEOUS) ×3 IMPLANT
BANDAGE COBAN STERILE 2 (GAUZE/BANDAGES/DRESSINGS) ×3 IMPLANT
BNDG COHESIVE 1X5 TAN STRL LF (GAUZE/BANDAGES/DRESSINGS) ×3 IMPLANT
BNDG CONFORM 2 STRL LF (GAUZE/BANDAGES/DRESSINGS) ×3 IMPLANT
BNDG GAUZE ELAST 4 BULKY (GAUZE/BANDAGES/DRESSINGS) ×3 IMPLANT
CORD BIPOLAR FORCEPS 12FT (ELECTRODE) ×3 IMPLANT
COVER SURGICAL LIGHT HANDLE (MISCELLANEOUS) ×3 IMPLANT
CUFF TOURN SGL QUICK 18 (TOURNIQUET CUFF) ×3 IMPLANT
CUFF TOURN SGL QUICK 24 (TOURNIQUET CUFF)
CUFF TRNQT CYL 24X4X40X1 (TOURNIQUET CUFF) IMPLANT
DRAIN PENROSE 18X1/2 LTX STRL (DRAIN) IMPLANT
DRAPE SURG 17X11 SM STRL (DRAPES) ×6 IMPLANT
DRSG EMULSION OIL 3X3 NADH (GAUZE/BANDAGES/DRESSINGS) ×3 IMPLANT
ELECT REM PT RETURN 15FT ADLT (MISCELLANEOUS) ×3 IMPLANT
GAUZE SPONGE 4X4 12PLY STRL (GAUZE/BANDAGES/DRESSINGS) ×3 IMPLANT
GLOVE BIOGEL M STRL SZ7.5 (GLOVE) ×3 IMPLANT
GLOVE BIOGEL PI IND STRL 6.5 (GLOVE) ×1 IMPLANT
GLOVE BIOGEL PI IND STRL 7.0 (GLOVE) ×1 IMPLANT
GLOVE BIOGEL PI IND STRL 7.5 (GLOVE) ×1 IMPLANT
GLOVE BIOGEL PI INDICATOR 6.5 (GLOVE) ×2
GLOVE BIOGEL PI INDICATOR 7.0 (GLOVE) ×2
GLOVE BIOGEL PI INDICATOR 7.5 (GLOVE) ×2
HANDPIECE INTERPULSE COAX TIP (DISPOSABLE)
KIT BASIN OR (CUSTOM PROCEDURE TRAY) ×3 IMPLANT
NEEDLE HYPO 22GX1.5 SAFETY (NEEDLE) ×3 IMPLANT
NS IRRIG 1000ML POUR BTL (IV SOLUTION) ×3 IMPLANT
PACK ORTHO EXTREMITY (CUSTOM PROCEDURE TRAY) ×3 IMPLANT
POSITIONER SURGICAL ARM (MISCELLANEOUS) ×3 IMPLANT
SET CYSTO W/LG BORE CLAMP LF (SET/KITS/TRAYS/PACK) ×3 IMPLANT
SET HNDPC FAN SPRY TIP SCT (DISPOSABLE) IMPLANT
STOCKINETTE 6  STRL (DRAPES) ×2
STOCKINETTE 6 STRL (DRAPES) ×1 IMPLANT
SUT ETHILON 3 0 PS 1 (SUTURE) ×6 IMPLANT
SUT ETHILON 4 0 PS 2 18 (SUTURE) ×6 IMPLANT
SUT PROLENE 3 0 PS 2 (SUTURE) ×3 IMPLANT
SWAB COLLECTION DEVICE MRSA (MISCELLANEOUS) IMPLANT
SWAB CULTURE ESWAB REG 1ML (MISCELLANEOUS) IMPLANT
SYR CONTROL 10ML LL (SYRINGE) ×3 IMPLANT

## 2018-02-22 NOTE — Transfer of Care (Addendum)
Immediate Anesthesia Transfer of Care Note  Patient: Hunter [name]  Procedure(s) Performed: IRRIGATION AND DEBRIDEMENT EXTREMITY (Right )  Patient Location: ICU  Anesthesia Type:General  Level of Consciousness: awake and pateint uncooperative  Airway & Oxygen Therapy: Patient Spontanous Breathing and Patient connected to nasal cannula oxygen  Post-op Assessment: Report given to RN and Post -op Vital signs reviewed and stable  Post vital signs: Reviewed and stable  Last Vitals:  Vitals Value Taken Time  BP    Temp    Pulse    Resp    SpO2      Last Pain:  Vitals:   02/22/18 1551  TempSrc: Axillary  PainSc:          Complications: No apparent anesthesia complications

## 2018-02-22 NOTE — Progress Notes (Signed)
Pharmacy Antibiotic Note  Hunter MountsDavid Anthony [name] is a 32 y.o. male admitted on 02/21/2018 with cellulitis.  Pharmacy has been consulted for Vancomycin dosing.  Plan: Vancomycin 1500mg  iv x1, then 1250mg  iv q24hr  Goal AUC = 400 - 500 for all indications, except meningitis (goal AUC > 500 and Cmin 15-20 mcg/mL)     Temp (24hrs), Avg:97.7 F (36.5 C), Min:96.6 F (35.9 C), Max:99.3 F (37.4 C)  Recent Labs  Lab 02/16/18 0508 02/21/18 2009 02/21/18 2048  WBC 8.0 34.3*  --   CREATININE  --   --  2.06*    Estimated Creatinine Clearance: 58.7 mL/min (A) (by C-G formula based on SCr of 2.06 mg/dL (H)).    No Known Allergies  Antimicrobials this admission: Vancomycin 02/21/2018 >>  Dose adjustments this admission: -  Microbiology results: -  Thank you for allowing pharmacy to be a part of this patient's care.  Hunter DavidsonGrimsley [name], Hunter [name] 02/22/2018 5:35 AM

## 2018-02-22 NOTE — ED Notes (Signed)
Pt chewing on kerlix dressing and has ripped secure catheter sticker off, will place mittens for comfort.

## 2018-02-22 NOTE — Plan of Care (Signed)
32 year old male with a history of polysubstance abuse admitted with delirium and agitation.  He was on the third floor of a hotel threatening to jump acting erratically and jerking movements.  He required 6 people restrain in the ED.Marland Kitchen.  He received Geodon in the ER.  His urine drug screen was positive for amphetamines opiates and cocaine.  Significant leukocytosis on admission I do not have a CBC from today.  He also have infected MRSA positive right finger cellulitis.  HE IS NOT MEDICALLY STABLE FOR OR AT THIS TIME.  Consulted critical care.

## 2018-02-22 NOTE — Discharge Instructions (Signed)
Hand Surgery Instructions:  Continue wound care as taught in the hospital--cleansing the wound and re-dressing it.

## 2018-02-22 NOTE — Anesthesia Postprocedure Evaluation (Signed)
Anesthesia Post Note  [facility]  Procedure(s) Performed: IRRIGATION AND DEBRIDEMENT EXTREMITY (Right )     Patient location during evaluation: ICU Anesthesia Type: General Level of consciousness: sedated Pain management: pain level controlled Vital Signs Assessment: post-procedure vital signs reviewed and stable Respiratory status: patient connected to nasal cannula oxygen Cardiovascular status: stable Postop Assessment: no apparent nausea or vomiting Anesthetic complications: no    Last Vitals:  Vitals:   02/22/18 0830 02/22/18 1551  BP:    Pulse:    Resp:    Temp: 36.8 C 36.6 C  SpO2:      Last Pain:  Vitals:   02/22/18 1551  TempSrc: Axillary  PainSc:                  Lowella CurbWarren Ray Elizibeth Breau

## 2018-02-22 NOTE — Plan of Care (Signed)
Patient needs surgery for infected right finger with mrsa.he is currently stable with normal vitals afebrile.

## 2018-02-22 NOTE — ED Notes (Signed)
Pt has been restless agitated for over an hour. Pt was given 2mg  of ativan, continues to be restless, trying to get out of bed and restraints.. Some periods of confusion, able to say he is [facility] when asked where he is at. Elevated up RUE to decrease swelling potential from infected digit. Telfa dressing, gauze and kerlix wrapped infected finger. Dr. Julian ReilGardner gave additional order for ativan. Pt continues in restraints. Pt given ice chips to moisten his mouth. Foley catheter draining dark amber urine. IV hydration in progress.

## 2018-02-22 NOTE — ED Notes (Signed)
Security called to assist in escorting patient to room.

## 2018-02-22 NOTE — Op Note (Signed)
02/21/2018 - 02/22/2018  1:44 PM  PATIENT:  Hunter [name]  32 y.o. male  PRE-OPERATIVE DIAGNOSIS:  R IF / hand infection  POST-OPERATIVE DIAGNOSIS:  Same  PROCEDURE:   1.  Removal of sutures right index finger    2.  Excisional debridement of skin, subcutaneous tissues, and tendon sheath, right index finger    3.  Intermediate closure of right index finger wound, 8 cm  SURGEON: Cliffton Asters. Janee Morn, MD  PHYSICIAN ASSISTANT: none  ANESTHESIA:  general  SPECIMENS:  None  DRAINS:   None  EBL:  less than 50 mL  PREOPERATIVE INDICATIONS:  Hunter [name] is a  32 y.o. male with a R IF/ hand deep infection  The risks benefits and alternatives were not discussed with the patient preoperatively, due to his acute mental impairment, and there was no known NOK from whom to obtain consent.  We proceeded with operative infection care due to the acute nature of this problem and its possible role in contributing to his delerium.  The risks  include but not limited to the risks of infection, bleeding, nerve injury, cardiopulmonary complications, the need for revision surgery, among others, and the patient verbalized understanding and consented to proceed.  OPERATIVE IMPLANTS: none  OPERATIVE PROCEDURE:  The patient was escorted to the operative theatre and placed in a supine position, already on standing antibiotics.  General anesthesia was administred.  A surgical "time-out" was performed during which the planned procedure, proposed operative site, and the correct patient identity were compared to the operative consent and agreement confirmed by the circulating nurse according to current facility policy.  Following application of a tourniquet to the operative extremity, the exposed skin was pre-scrubbed with a Hibiclens scrub brush before being formally prepped with Betadine and draped in the usual sterile fashion.    First, multiple sutures were removed in the right index finger.  The finger  itself was less swollen than I had seen previously, and the hand was certainly less swollen.  There was however progressive necrosis of skin at the tip of the digit on the pulp.  This required excisional debridement with scissors and forceps, leaving a wound about 1.5 cm in diameter missing skin coverage.  The excisional debridement included skin, subcutaneous tissues, and even some of the flexor tendon distal aspects of the sheath.  The tendon itself was not desiccated.  After the excisional debridement was performed, scraping debridement was performed with the edge of the forcep and the more proximal aspects of the wound and then the wound was copiously irrigated with a liter of running irrigant.  The remaining tissues appeared healthy and somewhat even hyperemic. Attention was then shifted to closure, where 3-0 Prolene sutures were used to reapproximate the wound.  The distal flap was actually rotated slightly to try to provide direct coverage over the flexor tendon sheath, and this actually required a little bit of closure of the wound with the digit slightly flexed owing to the loss of pulp tissue.  The wound that was closed measured about 8 cm.  Bacitracin ointment was applied to the open clefted areas followed by Adaptic, gauze, and a very light Kerlix/Coban dressing, looping around the wrist to help prevent it from becoming dislodged.  He was awakened and taken back to the ICU.  DISPOSITION: He will remain hospitalized due to his other systemic issues.  Obviously he will require continued antibiotic treatment and wound management while inpatient.  I am hopeful that no additional surgical  procedures will be required

## 2018-02-22 NOTE — Anesthesia Procedure Notes (Signed)
Procedure Name: Intubation Date/Time: 02/22/2018 4:11 PM Performed by: Minerva EndsMirarchi, Akia Desroches M, CRNA Pre-anesthesia Checklist: Patient identified, Emergency Drugs available, Suction available and Patient being monitored Patient Re-evaluated:Patient Re-evaluated prior to induction Oxygen Delivery Method: Circle System Utilized Preoxygenation: Pre-oxygenation with 100% oxygen Induction Type: IV induction Ventilation: Mask ventilation without difficulty Laryngoscope Size: Miller and 2 Grade View: Grade I Tube type: Oral Number of attempts: 1 Airway Equipment and Method: Stylet and Oral airway Placement Confirmation: ETT inserted through vocal cords under direct vision,  positive ETCO2 and breath sounds checked- equal and bilateral Secured at: 22 cm Tube secured with: Tape Dental Injury: Teeth and Oropharynx as per pre-operative assessment  Comments: Smooth IV induction Miller-- RSI- no cricoid--- intubation AM CRNA atraumatic-- teeth and mouth as preop-- bilat BS

## 2018-02-22 NOTE — ED Notes (Signed)
ED TO INPATIENT HANDOFF REPORT  Name/Age/Gender Hunter [name] 32 y.o. male  Code Status    Code Status Orders  (From admission, onward)         Start     Ordered   02/22/18 0052  Full code  Continuous     02/22/18 0052        Code Status History    Date Active Date Inactive Code Status Order ID Comments User Context   02/15/2018 0156 02/16/2018 2011 Full Code [id number]  Welford Roche, MD Inpatient   02/14/2018 1650 02/15/2018 0156 Full Code [id number]  Mosetta Anis, MD ED   05/22/2017 1822 07/03/2017 1703 Full Code [id number]  Robbie Lis, MD Inpatient   04/27/2017 0329 05/01/2017 1732 Full Code [id number]  Norval Morton, MD Inpatient      Home/SNF/Other Home  Chief Complaint IVC  Level of Care/Admitting Diagnosis ED Disposition    ED Disposition Condition [facility] Area: [facility] [[id number]]  Level of Care: Stepdown [14]  Admit to SDU based on following criteria: Severe physiological/psychological symptoms:  Any diagnosis requiring assessment & intervention at least every 4 hours on an ongoing basis to obtain desired patient outcomes including stability and rehabilitation  Diagnosis: Toxic encephalopathy [349.82.ICD-9-CM]  Admitting Physician: Etta Quill [[id number]]  Attending Physician: Etta Quill [[id number]]  PT Class (Do Not Modify): Observation [104]  PT Acc Code (Do Not Modify): Observation [10022]       Medical History Past Medical History:  Diagnosis Date  . Chronic hepatitis C without hepatic coma ([place]) 04/28/2017  . Hepatitis-C   . IVDU (intravenous drug user)   . Tourette's     Allergies No Known Allergies  IV Location/Drains/Wounds Patient Lines/Drains/Airways Status   Active Line/Drains/Airways    Name:   Placement date:   Placement time:   Site:   Days:   Peripheral IV 02/21/18 Right Forearm   02/21/18    2015    Forearm   1   Urethral Catheter Nilsa Nutting, RN 14 Fr.   02/22/18    0043     -   less than 1   Incision (Closed) 02/14/18 Hand Right   02/14/18    2156     8          Labs/Imaging Results for orders placed or performed during the hospital encounter of 02/21/18 (from the past 48 hour(s))  CBG monitoring, ED     Status: Abnormal   Collection Time: 02/21/18  6:25 PM  Result Value Ref Range   Glucose-Capillary 61 (L) 70 - 99 mg/dL   Comment 1 Notify RN    Comment 2 Document in Chart   Ethanol     Status: None   Collection Time: 02/21/18  8:09 PM  Result Value Ref Range   Alcohol, Ethyl (B) <10 <10 mg/dL    Comment: (NOTE) Lowest detectable limit for serum alcohol is 10 mg/dL. For medical purposes only. Performed at [facility], [place] [address], [place], [place] [address]   CBC with Diff     Status: Abnormal   Collection Time: 02/21/18  8:09 PM  Result Value Ref Range   WBC 34.3 (H) 4.0 - 10.5 K/uL   RBC 4.48 4.22 - 5.81 MIL/uL   Hemoglobin 13.0 13.0 - 17.0 g/dL   HCT 38.9 (L) 39.0 - 52.0 %   MCV 86.8 78.0 - 100.0 fL   MCH 29.0 26.0 - 34.0 pg  MCHC 33.4 30.0 - 36.0 g/dL   RDW 15.2 11.5 - 15.5 %   Platelets 605 (H) 150 - 400 K/uL   Neutrophils Relative % 88 %   Neutro Abs 30.0 1.7 - 7.7 K/uL   Lymphocytes Relative 8 %   Lymphs Abs 2.9 0.7 - 4.0 K/uL   Monocytes Relative 4 %   Monocytes Absolute 1.4 (H) 0.1 - 1.0 K/uL   Eosinophils Relative 0 %   Eosinophils Absolute 0.0 0.0 - 0.7 K/uL   Basophils Relative 0 %   Basophils Absolute 0.1 0.0 - 0.1 K/uL    Comment: Performed at [facility], [place] [address], [place], [place] [address]  Acetaminophen level     Status: Abnormal   Collection Time: 02/21/18  8:09 PM  Result Value Ref Range   Acetaminophen (Tylenol), Serum <10 (L) 10 - 30 ug/mL    Comment: (NOTE) Therapeutic concentrations vary significantly. A range of 10-30 ug/mL  may be an effective concentration for many patients. However, some  are best treated at concentrations outside of this  range. Acetaminophen concentrations >150 ug/mL at 4 hours after ingestion  and >50 ug/mL at 12 hours after ingestion are often associated with  toxic reactions. Performed at [facility], [place] [address], [place], [place] [address]   Salicylate level     Status: None   Collection Time: 02/21/18  8:09 PM  Result Value Ref Range   Salicylate Lvl <7.5 2.8 - 30.0 mg/dL    Comment: Performed at [facility], [place] [address], [place], [place] [address]  Comprehensive metabolic panel     Status: Abnormal   Collection Time: 02/21/18  8:48 PM  Result Value Ref Range   Sodium 148 (H) 135 - 145 mmol/L   Potassium 3.6 3.5 - 5.1 mmol/L   Chloride 107 98 - 111 mmol/L   CO2 24 22 - 32 mmol/L   Glucose, Bld 83 70 - 99 mg/dL   BUN 26 (H) 6 - 20 mg/dL   Creatinine, Ser 2.06 (H) 0.61 - 1.24 mg/dL   Calcium 10.0 8.9 - 10.3 mg/dL   Total Protein 9.5 (H) 6.5 - 8.1 g/dL   Albumin 4.5 3.5 - 5.0 g/dL   AST 67 (H) 15 - 41 U/L   ALT 41 0 - 44 U/L   Alkaline Phosphatase 159 (H) 38 - 126 U/L   Total Bilirubin 1.4 (H) 0.3 - 1.2 mg/dL   GFR calc non Af Amer 41 (L) >60 mL/min   GFR calc Af Amer 48 (L) >60 mL/min    Comment: (NOTE) The eGFR has been calculated using the CKD EPI equation. This calculation has not been validated in all clinical situations. eGFR's persistently <60 mL/min signify possible Chronic Kidney Disease.    Anion gap 17 (H) 5 - 15    Comment: Performed at [facility], [place] [address], [place], [place] [address]  CBG monitoring, ED     Status: None   Collection Time: 02/21/18  9:48 PM  Result Value Ref Range   Glucose-Capillary 82 70 - 99 mg/dL   Comment 1 Notify RN    Comment 2 Document in Chart   Urine rapid drug screen (hosp performed)     Status: Abnormal   Collection Time: 02/22/18 12:27 AM  Result Value Ref Range   Opiates POSITIVE (A) NONE DETECTED   Cocaine POSITIVE (A) NONE DETECTED   Benzodiazepines NONE DETECTED  NONE DETECTED   Amphetamines POSITIVE (A)  NONE DETECTED   Tetrahydrocannabinol NONE DETECTED NONE DETECTED   Barbiturates NONE DETECTED NONE DETECTED    Comment: (NOTE) DRUG SCREEN FOR MEDICAL PURPOSES ONLY.  IF CONFIRMATION IS NEEDED FOR ANY PURPOSE, NOTIFY LAB WITHIN 5 DAYS. LOWEST DETECTABLE LIMITS FOR URINE DRUG SCREEN Drug Class                     Cutoff (ng/mL) Amphetamine and metabolites    1000 Barbiturate and metabolites    200 Benzodiazepine                 376 Tricyclics and metabolites     300 Opiates and metabolites        300 Cocaine and metabolites        300 THC                            50 Performed at [facility], [place] [address] [place], [place] [address]   Urinalysis, Routine w reflex microscopic     Status: Abnormal   Collection Time: 02/22/18 12:27 AM  Result Value Ref Range   Color, Urine AMBER (A) YELLOW    Comment: BIOCHEMICALS MAY BE AFFECTED BY COLOR   APPearance CLOUDY (A) CLEAR   Specific Gravity, Urine 1.023 1.005 - 1.030   pH 5.0 5.0 - 8.0   Glucose, UA NEGATIVE NEGATIVE mg/dL   Hgb urine dipstick SMALL (A) NEGATIVE   Bilirubin Urine NEGATIVE NEGATIVE   Ketones, ur 5 (A) NEGATIVE mg/dL   Protein, ur 100 (A) NEGATIVE mg/dL   Nitrite NEGATIVE NEGATIVE   Leukocytes, UA NEGATIVE NEGATIVE   RBC / HPF 0-5 0 - 5 RBC/hpf   WBC, UA 11-20 0 - 5 WBC/hpf   Bacteria, UA RARE (A) NONE SEEN   Squamous Epithelial / LPF 0-5 0 - 5   Mucus PRESENT    Hyaline Casts, UA PRESENT    Sperm, UA PRESENT     Comment: Performed at [facility], [place] [address], [place], [place] [address]  Basic metabolic panel     Status: Abnormal   Collection Time: 02/22/18  5:00 AM  Result Value Ref Range   Sodium 147 (H) 135 - 145 mmol/L   Potassium 4.0 3.5 - 5.1 mmol/L   Chloride 113 (H) 98 - 111 mmol/L   CO2 23 22 - 32 mmol/L   Glucose, Bld 91 70 - 99 mg/dL   BUN 35 (H) 6 - 20 mg/dL   Creatinine, Ser 1.61 (H) 0.61 - 1.24 mg/dL    Calcium 8.6 (L) 8.9 - 10.3 mg/dL   GFR calc non Af Amer 56 (L) >60 mL/min   GFR calc Af Amer >60 >60 mL/min    Comment: (NOTE) The eGFR has been calculated using the CKD EPI equation. This calculation has not been validated in all clinical situations. eGFR's persistently <60 mL/min signify possible Chronic Kidney Disease.    Anion gap 11 5 - 15    Comment: Performed at [facility], [place] [address] [place], [place] [address]   Ct Head Wo Contrast  Result Date: 02/21/2018 CLINICAL DATA:  Altered LOC EXAM: CT HEAD WITHOUT CONTRAST TECHNIQUE: Contiguous axial images were obtained from the base of the skull through the vertex without intravenous contrast. COMPARISON:  None. FINDINGS: Brain: No evidence of acute infarction, hemorrhage, hydrocephalus, extra-axial collection or mass lesion/mass effect. Vascular: No hyperdense vessel or unexpected calcification. Skull: Normal. Negative  for fracture or focal lesion. Sinuses/Orbits: No acute finding. Other: None IMPRESSION: Negative non contrasted CT appearance of the brain. Electronically Signed   By: Donavan Foil M.D.   On: 02/21/2018 21:12   Dg Hand Complete Right  Result Date: 02/21/2018 CLINICAL DATA:  32 year old male with large 2nd finger laceration. EXAM: RIGHT HAND - COMPLETE 3+ VIEW COMPARISON:  Right hand series 02/14/2018. FINDINGS: New soft tissue wound along the radial aspect of the right 2nd finger where previously moderate to severe generalized finger soft tissue swelling was noted. No radiopaque foreign body identified. Underlying osseous structures appear stable. Chronic, healed 5th metacarpal fracture. Otherwise no fracture, dislocation, or arthropathic changes identified about the right hand. IMPRESSION: 1. Soft tissue wound along the radial aspect of the right index finger. No radiopaque foreign body identified. 2. No acute osseous abnormality identified. Healed chronic 5th metacarpal fracture. Electronically Signed    By: Genevie Ann M.D.   On: 02/21/2018 21:00    Pending Labs Unresulted Labs (From admission, onward)    Start     Ordered   02/23/18 0500  Comprehensive metabolic panel  Tomorrow morning,   R     02/22/18 0556   02/23/18 0500  CBC  Tomorrow morning,   R     02/22/18 0556   02/23/18 0500  Magnesium  Tomorrow morning,   R     02/22/18 0556   02/21/18 2023  Culture, blood (routine x 2)  BLOOD CULTURE X 2,   STAT     02/21/18 2023          Vitals/Pain Today's Vitals   02/22/18 0718 02/22/18 0720 02/22/18 0730 02/22/18 0730  BP:  114/69 114/72   Pulse:  95 97   Resp:  14 13   Temp:  98.6 F (37 C) 98.6 F (37 C)   SpO2:  100% 100%   PainSc: Asleep   Asleep    Isolation Precautions No active isolations  Medications Medications  sterile water (preservative free) injection (has no administration in time range)  0.9 %  sodium chloride infusion ( Intravenous New Bag/Given 02/22/18 0418)  acetaminophen (TYLENOL) tablet 650 mg (has no administration in time range)    Or  acetaminophen (TYLENOL) suppository 650 mg (has no administration in time range)  ondansetron (ZOFRAN) tablet 4 mg (has no administration in time range)    Or  ondansetron (ZOFRAN) injection 4 mg (has no administration in time range)  LORazepam (ATIVAN) injection 1-2 mg (2 mg Intravenous Given 02/22/18 0417)  vancomycin (VANCOCIN) 1,250 mg in sodium chloride 0.9 % 250 mL IVPB (has no administration in time range)  ziprasidone (GEODON) 20 MG injection (20 mg  Given 02/21/18 1740)  sodium chloride 0.9 % bolus 1,000 mL (0 mLs Intravenous Stopped 02/21/18 2227)  LORazepam (ATIVAN) injection 2 mg (2 mg Intravenous Given 02/21/18 2016)  vancomycin (VANCOCIN) 1,500 mg in sodium chloride 0.9 % 500 mL IVPB (0 mg Intravenous Stopped 02/22/18 0137)  0.9 %  sodium chloride infusion ( Intravenous Stopped 02/22/18 0208)  LORazepam (ATIVAN) injection 1 mg (1 mg Intravenous Given 02/22/18 0013)  LORazepam (ATIVAN) injection 1 mg (1 mg  Intravenous Given 02/21/18 2349)  LORazepam (ATIVAN) injection 2 mg (2 mg Intravenous Given 02/22/18 0504)    Mobility walks

## 2018-02-22 NOTE — Progress Notes (Signed)
eLink Physician-Brief Progress Note [name] DOB: 1985-10-20 MRN: [id number]   Date of Service  02/22/2018  HPI/Events of Note  Delirium - Remains severely agitated. Now on Precedex IV infusion at 1.7 mcg/kg/hour.  eICU Interventions  Will order: 1. Haldol 5 mg IV now and Q 3 hours PRN severe agitation.      Intervention Category Major Interventions: Delirium, psychosis, severe agitation - evaluation and management  Sommer,Steven Eugene 02/22/2018, 10:17 PM

## 2018-02-22 NOTE — Progress Notes (Signed)
Patient known to me from recent hospitalization, during which I performed drainage and debridement of the right index finger for deep infection.  In addition he underwent second irrigation and closure with local.  Deep cultures at that time revealed MRSA.  He received vancomycin in the hospital, and was discharged with oral Bactrim. He was admitted overnight with acute delirium, and largely unable to provide adequate history regarding whether he was or was not taking Bactrim, etc.  WBC was 34.  It is unclear to what extent infection may be contributing to his delirium as it is likely multifactorial.  He is incapable of providing consent, and there is no known relative.  I did call the number listed on the chart that was obtained, for Ms. Tiller, who may be an ex-wife??, and it was not a working number. We will proceed to the operating room without formal consent on an emergency basis, where sutures can be removed, the wound explored and again irrigated, possibly extending further into the hand, etc.    Neil Crouchave Jaqualyn Juday, MD Hand Surgery

## 2018-02-22 NOTE — ED Notes (Signed)
Sitter at bedside.

## 2018-02-22 NOTE — Progress Notes (Signed)
eLink Physician-Brief Progress Note [facility]: 05/02/1986 MRN: [id number]   Date of Service  02/22/2018  HPI/Events of Note  Severe Agitation/Delirium - Currently on Precedex IV infusion at 1.2 mcg/kg/hour. QTc interval = 0.42.  eICU Interventions  Will order: 1. Haldol 5 mg IV now.  2. Increase ceiling on Precedex to 1.7 mcg/kg/hour. 3. Monitor QTc interval Q 6 hours.      Intervention Category Major Interventions: Delirium, psychosis, severe agitation - evaluation and management  Sommer,Steven Eugene 02/22/2018, 7:19 PM

## 2018-02-22 NOTE — Care Management Note (Signed)
Case Management Note  Patient Details  Name: Hunter [name] MRN: [id number] Date of Birth: Mar 29, 1986  Subjective/Objective:                  32 y.o. male with medical history significant of IVDU, polysubstance abuse.  Patient was recently admitted to hospital from 9/9 to 9/11 for R finger cellulitis and abscess due to MRSA.  Underwent washout.  Today patient is brought in by EMS under IVC.  [employer] states patient was found on the third floor of hotel threatening to jump.  He was acting erratically, frequent jerking movements and not making any sense.  He initially was not answering questions, following commands, or making any sense.  Required some 6 people to restrain him to the bed initially in ED.  Action/Plan: Will follow for progression of care and clinical status. Will follow for case management needs none present at this time.  Expected Discharge Date:                  [facility]  In-House Referral:  Clinical Social Work  Discharge planning Services  CM Consult  Post Acute Care Choice:    Choice offered to:     DME Arranged:    DME Agency:     HH Arranged:    HH Agency:     Status of Service:  In process, will continue to follow  If discussed at Long Length of Stay Meetings, dates discussed:    Additional Comments:  Golda AcreDavis, Shawnelle Spoerl Lynn, RN 02/22/2018, 8:37 AM

## 2018-02-22 NOTE — Progress Notes (Signed)
S/p R IF tip excisional debridement. Rest of digit actually looked fairly healthy  Wound care instructions/orders written.  Infection management per primary team  Will need to f/u with me as an outpatient for suture removal and evaluation of wound healing in approx 2 weeks. I have placed that into Epic. I will continue to monitor his presence as an inpatient, and am available for any surgical concerns  Neil Crouchave Joyel Chenette, MD Hand Surgery

## 2018-02-22 NOTE — ED Notes (Signed)
Report given to RN Clovis RileyMitchell via phone in ICU for room 1232.

## 2018-02-22 NOTE — Anesthesia Procedure Notes (Signed)
Date/Time: 02/22/2018 4:50 PM Performed by: Minerva EndsMirarchi, Tamira Ryland M, CRNA Oxygen Delivery Method: Nasal cannula Placement Confirmation: positive ETCO2 and breath sounds checked- equal and bilateral

## 2018-02-22 NOTE — H&P (Addendum)
History and Physical    C[facility] QIO:[id number] DOB: 1986-05-18 DOA: 02/21/2018  PCP: Patient, No Pcp Per  Patient coming from: Home  I have personally briefly reviewed patient's old medical records in [facility]  Chief Complaint: Agitated Delirium  HPI: Hunter [name] is a 31 y.o. male with medical history significant of IVDU, polysubstance abuse.  Patient was recently admitted to hospital from 9/9 to 9/11 for R finger cellulitis and abscess due to MRSA.  Underwent washout.  Today patient is brought in by EMS under IVC.  [employer] states patient was found on the third floor of hotel threatening to jump.  He was acting erratically, frequent jerking movements and not making any sense.  He initially was not answering questions, following commands, or making any sense.  Required some 6 people to restrain him to the bed initially in ED.   ED Course: Given Geodon by EDP.  Now waking up from Geodon, still agitated but is answering some questions while we give ativan:  Denies suicidal ideation.  Also denies taking any substances at all today, which given his behavior and the fact that not 5 mins after saying that he begins asking RN staff for "a bong", I am somewhat dubious of this as being a fact.  UDS pending.  Lab work shows WBC 34.3k  AKI with creat 2.0   Review of Systems: As above, but not really able to obtain due to AMS  Past Medical History:  Diagnosis Date  . Chronic hepatitis C without hepatic coma (HCC) 04/28/2017  . Hepatitis-C   . IVDU (intravenous drug user)   . Tourette's     Past Surgical History:  Procedure Laterality Date  . I&D EXTREMITY Right 04/26/2017   Procedure: IRRIGATION AND DEBRIDEMENT RIGHT HAND AND FOREARM;  Surgeon: Bradly Bienenstock, MD;  Location: [facility] OR;  Service: Orthopedics;  Laterality: Right;  . I&D EXTREMITY Right 02/14/2018   Procedure: IRRIGATION AND DEBRIDEMENT RIGHT INDEX FINGER ;  Surgeon: Mack Hook, MD;  Location: [facility] OR;   Service: Orthopedics;  Laterality: Right;  . TEE WITHOUT CARDIOVERSION N/A 04/30/2017   Procedure: TRANSESOPHAGEAL ECHOCARDIOGRAM (TEE);  Surgeon: Quintella Reichert, MD;  Location: [facility] ENDOSCOPY;  Service: Cardiovascular;  Laterality: N/A;  . TEE WITHOUT CARDIOVERSION N/A 05/28/2017   Procedure: TRANSESOPHAGEAL ECHOCARDIOGRAM (TEE);  Surgeon: Pricilla Riffle, MD;  Location: [facility] ENDOSCOPY;  Service: Cardiovascular;  Laterality: N/A;  . TONSILLECTOMY       reports that he has been smoking cigarettes. He has never used smokeless tobacco. He reports that he has current or past drug history. Drug: IV. He reports that he does not drink alcohol.  No Known Allergies  No family history on file. Unable to obtain due to patients AMS  Prior to Admission medications   Medication Sig Start Date End Date Taking? Authorizing Provider  acetaminophen (TYLENOL) 325 MG tablet Take 2 tablets (650 mg total) by mouth every 6 (six) hours as needed for mild pain (or Fever >/= 101). Patient not taking: Reported on 05/22/2017 05/01/17   Standley Brooking, MD  bacitracin ointment Apply topically 2 (two) times daily. 02/16/18   Theotis Barrio, MD  naproxen (NAPROSYN) 500 MG tablet Take 1 tablet (500 mg total) by mouth 2 (two) times daily with a meal. 02/16/18   Mack Hook, MD  oxyCODONE (ROXICODONE) 5 MG immediate release tablet Take 0-1 tablets (0-5 mg total) by mouth every 6 (six) hours as needed for severe pain (postop pain). 02/16/18  Mack Hookhompson, Elius, MD  polyethylene glycol [facility]MIRALAX / Ethelene HalGLYCOLAX) packet Take 17 g by mouth daily as needed for mild constipation. 02/16/18   Theotis BarrioLee, Joshua K, MD  sulfamethoxazole-trimethoprim (BACTRIM DS,SEPTRA DS) 800-160 MG tablet Take 1 tablet by mouth 2 (two) times daily for 7 days. 02/16/18 02/23/18  Theotis BarrioLee, Joshua K, MD    Physical Exam: Vitals:   02/21/18 2245 02/21/18 2300 02/21/18 2315 02/21/18 2330  BP: 109/74 111/81 109/78 109/86  Pulse: 98 97 95 96  Resp: 11 11 15 10   SpO2: 99%  100% 99% 99%    Constitutional: Agitated, writhing around in bed Eyes: PERRL, lids and conjunctivae normal ENMT: Mucous membranes are moist. Posterior pharynx clear of any exudate or lesions.Normal dentition.  Neck: normal, supple, no masses, no thyromegaly Respiratory: clear to auscultation bilaterally, no wheezing, no crackles. Normal respiratory effort. No accessory muscle use.  Cardiovascular: Tachycardic Abdomen: no tenderness, no masses palpated. No hepatosplenomegaly. Bowel sounds positive.  Musculoskeletal: no clubbing / cyanosis. No joint deformity upper and lower extremities. Good ROM, no contractures. Normal muscle tone.  Skin:    Neurologic: Grossly non-focal, not obeying commands, very good strength in all 4 extremities (requiring multiple people to hold him down). Psychiatric: Agitated delirium, nonsense words   Labs on Admission: I have personally reviewed following labs and imaging studies  CBC: Recent Labs  Lab 02/15/18 0427 02/16/18 0508 02/21/18 2009  WBC 10.4 8.0 34.3*  NEUTROABS  --   --  30.0  HGB 9.4* 9.9* 13.0  HCT 29.7* 30.9* 38.9*  MCV 89.7 90.1 86.8  PLT 232 218 605*   Basic Metabolic Panel: Recent Labs  Lab 02/15/18 0427 02/21/18 2048  NA 137 148*  K 3.9 3.6  CL 105 107  CO2 26 24  GLUCOSE 117* 83  BUN 5* 26*  CREATININE 0.71 2.06*  CALCIUM 8.0* 10.0   GFR: Estimated Creatinine Clearance: 58.7 mL/min (A) (by C-G formula based on SCr of 2.06 mg/dL (H)). Liver Function Tests: Recent Labs  Lab 02/15/18 0427 02/21/18 2048  AST 38 67*  ALT 25 41  ALKPHOS 126 159*  BILITOT 0.8 1.4*  PROT 6.3* 9.5*  ALBUMIN 2.7* 4.5   No results for input(s): LIPASE, AMYLASE in the last 168 hours. No results for input(s): AMMONIA in the last 168 hours. Coagulation Profile: No results for input(s): INR, PROTIME in the last 168 hours. Cardiac Enzymes: No results for input(s): CKTOTAL, CKMB, CKMBINDEX, TROPONINI in the last 168 hours. BNP (last 3  results) No results for input(s): PROBNP in the last 8760 hours. HbA1C: No results for input(s): HGBA1C in the last 72 hours. CBG: Recent Labs  Lab 02/21/18 1825 02/21/18 2148  GLUCAP 61* 82   Lipid Profile: No results for input(s): CHOL, HDL, LDLCALC, TRIG, CHOLHDL, LDLDIRECT in the last 72 hours. Thyroid Function Tests: No results for input(s): TSH, T4TOTAL, FREET4, T3FREE, THYROIDAB in the last 72 hours. Anemia Panel: No results for input(s): VITAMINB12, FOLATE, FERRITIN, TIBC, IRON, RETICCTPCT in the last 72 hours. Urine analysis:    Component Value Date/Time   COLORURINE YELLOW 04/26/2017 1840   APPEARANCEUR CLEAR 04/26/2017 1840   LABSPEC 1.010 04/26/2017 1840   PHURINE 6.0 04/26/2017 1840   GLUCOSEU NEGATIVE 04/26/2017 1840   HGBUR NEGATIVE 04/26/2017 1840   BILIRUBINUR NEGATIVE 04/26/2017 1840   KETONESUR NEGATIVE 04/26/2017 1840   PROTEINUR NEGATIVE 04/26/2017 1840   NITRITE NEGATIVE 04/26/2017 1840   LEUKOCYTESUR NEGATIVE 04/26/2017 1840    Radiological Exams on Admission: Ct Head Wo Contrast  Result Date: 02/21/2018 CLINICAL DATA:  Altered LOC EXAM: CT HEAD WITHOUT CONTRAST TECHNIQUE: Contiguous axial images were obtained from the base of the skull through the vertex without intravenous contrast. COMPARISON:  None. FINDINGS: Brain: No evidence of acute infarction, hemorrhage, hydrocephalus, extra-axial collection or mass lesion/mass effect. Vascular: No hyperdense vessel or unexpected calcification. Skull: Normal. Negative for fracture or focal lesion. Sinuses/Orbits: No acute finding. Other: None IMPRESSION: Negative non contrasted CT appearance of the brain. Electronically Signed   By: Jasmine Pang M.D.   On: 02/21/2018 21:12   Dg Hand Complete Right  Result Date: 02/21/2018 CLINICAL DATA:  32 year old male with large 2nd finger laceration. EXAM: RIGHT HAND - COMPLETE 3+ VIEW COMPARISON:  Right hand series 02/14/2018. FINDINGS: New soft tissue wound along the  radial aspect of the right 2nd finger where previously moderate to severe generalized finger soft tissue swelling was noted. No radiopaque foreign body identified. Underlying osseous structures appear stable. Chronic, healed 5th metacarpal fracture. Otherwise no fracture, dislocation, or arthropathic changes identified about the right hand. IMPRESSION: 1. Soft tissue wound along the radial aspect of the right index finger. No radiopaque foreign body identified. 2. No acute osseous abnormality identified. Healed chronic 5th metacarpal fracture. Electronically Signed   By: Odessa Fleming M.D.   On: 02/21/2018 21:00    EKG: Independently reviewed.  Assessment/Plan Principal Problem:   Toxic encephalopathy Active Problems:   IVDU (intravenous drug user)   Cellulitis of right hand   AKI (acute kidney injury) (HCC)    1. Acute toxic encephalopathy - due to substance use / abuse 1. UDS pending 2. PRN ativan for sedation 3. May need to transition to precedex as Geodon wears off. 4. May or may not need psych eval after substances wear off.  This still TBD. 2. Cellulitis of R hand - 1. Vanc ordered, note that he grew out MRSA from finger last week. 2. EDP spoke with Dr. Carollee Massed PA, they will see patient tomorrow, is fine to leave patient at [facility]. 3. AKI - 1. IVF: NS at 125 cc/hr 2. Intake and output 3. Repeat BMP in AM  DVT prophylaxis: SCDs Code Status: Full Family Communication: No family in room Disposition Plan: TBD Consults called: None Admission status: Place in obs   GARDNER, Heywood Iles. DO [facility] Pager [phone] Only works nights!  If 7AM-7PM, please contact the primary day team physician taking care of patient  [email or web address] Password [facility]  02/22/2018, 12:42 AM

## 2018-02-22 NOTE — Progress Notes (Signed)
NAME:  Hunter [name], MRN:  [id number], DOB:  1985-10-14, LOS: 0 ADMISSION DATE:  02/21/2018, CONSULTATION DATE:  02/22/18 REFERRING MD:  Dr. Ashley Royalty, CHIEF COMPLAINT:  Substance abuse / withdrawal   Brief History   32 y/o M admitted 9/16 with agitated delirium. He carries a hx of polysubstance abuse, tourette's syndrome, Hep C+.    He was found on the third floor of a hotel impaired, threatening to jump.  He was reportedly erratic and required 6 people to restrain him. UDS was positive for amphetamines, opiates & cocaine.  Prior to admit, he had concerns for right index finger infection and had an I&D of his finger per orthopedics.  He was supposed to be taking Bactrim.  The patient received multiple doses of Ativan & Geodon and was admitted per [facility].  He continued to have intermittent periods of agitated delirium mixed with calm/redirectable state.  Hospital course notable for AKI and leukocytosis.  PCCM consulted on 9/17 for evaluation of delirium / withdrawal.      Significant Hospital Events   9/16 Admit with AMS   Consults: date of consult/date signed off & final recs:  PCCM 9/17   Procedures (surgical and bedside):    Significant Diagnostic Tests:  9/16  CT Head >> negative  9/16  XR Hand >> soft tissue wound along the radial aspect of the right index finger, no acute osseous abnormality, healed chronic 5th metacarpal fracture  Micro Data: BCx2 9/17 >>  Antimicrobials:  Vancomycin 9/17 >>   Subjective:  Staff report pt has periods of calm mixed with agitated delirium.  S/P geodon.    Objective   Blood pressure 114/72, pulse 97, temperature 98.3 F (36.8 C), temperature source Axillary, resp. rate 13, SpO2 100 %.        Intake/Output Summary (Last 24 hours) at 02/22/2018 1359 Last data filed at 02/22/2018 0709 Gross per 24 hour  Intake -  Output 325 ml  Net -325 ml   There were no vitals filed for this visit.  Examination: General: thin adult male lying in  bed HENT: MM pink/dry, no jvd Lungs: even/non-labored, lungs bilaterally clear  Cardiovascular: s1s2 rrr, no m/r/g  Abdomen: flat, soft, bsx4 active  Extremities: warm/dry, multiple linear cut marks on legs, tattoos on upper extremities  Neuro: sedate GU: condom cath in place   Resolved Hospital Problem list     Assessment & Plan:   Polysubstance Abuse with Agitated Delirium - concern for withdrawal vs acute intoxication  P: PRN haldol, ativan > may need to liberalize further  Follow QTc with haldol  SDU monitoring  Does not currently need precedex Consider further Geodon but caution with QTc Aspiration & seizure precautions  Restraints per primary   AKI - suspect multifactorial, ? Rhabdo, volume depletion & vancomycin  P: Gentle IVF, NS at 129ml/hr  Assess CK   Right Hand Cellulitis  P: Vancomycin per priamry  Thrombocytosis - ? related to infection + drug intoxication P: Monitor    Disposition / Summary of Today's Plan 02/22/18   Continue SDU monitoring.  No indication for precedex at this time.     Diet: NPO  Pain/Anxiety/Delirium protocol (if indicated): n/a  VAP protocol (if indicated) n/a DVT prophylaxis: heparin  GI prophylaxis: n/a Hyperglycemia protocol: n/a Mobility: as tolerated  Code Status: Full Code  Family Communication: No family at bedside  Labs   CBC: Recent Labs  Lab 02/16/18 0508 02/21/18 2009  WBC 8.0 34.3*  NEUTROABS  --  30.0  HGB 9.9* 13.0  HCT 30.9* 38.9*  MCV 90.1 86.8  PLT 218 605*   Basic Metabolic Panel: Recent Labs  Lab 02/21/18 2048 02/22/18 0500  NA 148* 147*  K 3.6 4.0  CL 107 113*  CO2 24 23  GLUCOSE 83 91  BUN 26* 35*  CREATININE 2.06* 1.61*  CALCIUM 10.0 8.6*   GFR: Estimated Creatinine Clearance: 75.1 mL/min (A) (by C-G formula based on SCr of 1.61 mg/dL (H)). Recent Labs  Lab 02/16/18 0508 02/21/18 2009  WBC 8.0 34.3*   Liver Function Tests: Recent Labs  Lab 02/21/18 2048  AST 67*  ALT 41   ALKPHOS 159*  BILITOT 1.4*  PROT 9.5*  ALBUMIN 4.5   No results for input(s): LIPASE, AMYLASE in the last 168 hours. No results for input(s): AMMONIA in the last 168 hours.   ABG No results found for: PHART, PCO2ART, PO2ART, HCO3, TCO2, ACIDBASEDEF, O2SAT   Coagulation Profile: No results for input(s): INR, PROTIME in the last 168 hours.   Cardiac Enzymes: No results for input(s): CKTOTAL, CKMB, CKMBINDEX, TROPONINI in the last 168 hours.   HbA1C: No results found for: HGBA1C   CBG: Recent Labs  Lab 02/21/18 1825 02/21/18 2148  GLUCAP 61* 82    Admitting History of Present Illness.   32 y/o M admitted 9/16 with agitated delirium. He was found on the third floor of a hotel impaired, threatening to jump.  He was reportedly erratic and required 6 people to restrain him. UDS was positive for amphetamines, opiates & cocaine.  Prior to admit, he had concerns for right index finger infection and had an I&D of his finger per orthopedics.  He was supposed to be taking Bactrim.  The patient received multiple doses of Ativan & Geodon and was admitted per [facility].  He continued to have intermittent periods of agitated delirium mixed with calm/redirectable state.  PCCM consulted on 9/17 for evaluation of delirium / withdrawal.     Review of Systems:    Past medical history  He,  has a past medical history of Chronic hepatitis C without hepatic coma (HCC) (04/28/2017), Hepatitis-C, IVDU (intravenous drug user), and Tourette's.   Surgical History    Past Surgical History:  Procedure Laterality Date  . I&D EXTREMITY Right 04/26/2017   Procedure: IRRIGATION AND DEBRIDEMENT RIGHT HAND AND FOREARM;  Surgeon: Bradly Bienenstock, MD;  Location: [facility] OR;  Service: Orthopedics;  Laterality: Right;  . I&D EXTREMITY Right 02/14/2018   Procedure: IRRIGATION AND DEBRIDEMENT RIGHT INDEX FINGER ;  Surgeon: Mack Hook, MD;  Location: [facility] OR;  Service: Orthopedics;  Laterality: Right;  . TEE WITHOUT  CARDIOVERSION N/A 04/30/2017   Procedure: TRANSESOPHAGEAL ECHOCARDIOGRAM (TEE);  Surgeon: Quintella Reichert, MD;  Location: [facility] ENDOSCOPY;  Service: Cardiovascular;  Laterality: N/A;  . TEE WITHOUT CARDIOVERSION N/A 05/28/2017   Procedure: TRANSESOPHAGEAL ECHOCARDIOGRAM (TEE);  Surgeon: Pricilla Riffle, MD;  [facility] ENDOSCOPY;  Service: Cardiovascular;  Laterality: N/A;  . TONSILLECTOMY       Social History   Social History   Socioeconomic History  . Marital status: Divorced    Spouse name: Not on file  . Number of children: Not on file  . Years of education: Not on file  . Highest education level: Not on file  Occupational History  . Not on file  Social Needs  . Financial resource strain: Not on file  . Food insecurity:    Worry: Not on file    Inability: Not on  file  . Transportation needs:    Medical: Not on file    Non-medical: Not on file  Tobacco Use  . Smoking status: Current Every Day Smoker    Types: Cigarettes  . Smokeless tobacco: Never Used  Substance and Sexual Activity  . Alcohol use: No    Frequency: Never  . Drug use: Yes    Types: IV    Comment: heroin  . Sexual activity: Not on file  Lifestyle  . Physical activity:    Days per week: Not on file    Minutes per session: Not on file  . Stress: Not on file  Relationships  . Social connections:    Talks on phone: Not on file    Gets together: Not on file    Attends religious service: Not on file    Active member of club or organization: Not on file    Attends meetings of clubs or organizations: Not on file    Relationship status: Not on file  . Intimate partner violence:    Fear of current or ex partner: Not on file    Emotionally abused: Not on file    Physically abused: Not on file    Forced sexual activity: Not on file  Other Topics Concern  . Not on file  Social History Narrative  . Not on file  ,  reports that he has been smoking cigarettes. He has never used smokeless tobacco. He reports  that he has current or past drug history. Drug: IV. He reports that he does not drink alcohol.   Family history   His family history is not on file.   Allergies No Known Allergies   Home meds  Prior to Admission medications   Medication Sig Start Date End Date Taking? Authorizing Provider  acetaminophen (TYLENOL) 325 MG tablet Take 2 tablets (650 mg total) by mouth every 6 (six) hours as needed for mild pain (or Fever >/= 101). Patient not taking: Reported on 05/22/2017 05/01/17   Standley BrookingGoodrich, Daniel P, MD  bacitracin ointment Apply topically 2 (two) times daily. 02/16/18   Theotis BarrioLee, Joshua K, MD  naproxen (NAPROSYN) 500 MG tablet Take 1 tablet (500 mg total) by mouth 2 (two) times daily with a meal. 02/16/18   Mack Hookhompson, Kalyn, MD  oxyCODONE (ROXICODONE) 5 MG immediate release tablet Take 0-1 tablets (0-5 mg total) by mouth every 6 (six) hours as needed for severe pain (postop pain). 02/16/18   Mack Hookhompson, Rayce, MD  polyethylene glycol [facility](MIRALAX / Ethelene HalGLYCOLAX) packet Take 17 g by mouth daily as needed for mild constipation. 02/16/18   Theotis BarrioLee, Joshua K, MD  sulfamethoxazole-trimethoprim (BACTRIM DS,SEPTRA DS) 800-160 MG tablet Take 1 tablet by mouth 2 (two) times daily for 7 days. 02/16/18 02/23/18  Theotis BarrioLee, Joshua K, MD         Canary BrimBrandi Lourdes Kucharski, NP-C [facility] Pgr: [phone] or if no answer [phone] 02/22/2018, 1:59 PM

## 2018-02-22 NOTE — Progress Notes (Signed)
PCCM:  Called by nursing. Concerned for worsening agitation.  Will start precedex at this time.  Continue to monitor.   Josephine IgoBradley L Erby Sanderson, DO [facility] 02/22/2018 5:36 PM  Personal pager: [phone] If unanswered, please page CCM On-call: [phone]

## 2018-02-22 NOTE — Anesthesia Preprocedure Evaluation (Signed)
Anesthesia Evaluation  Patient identified by MRN, date of birth, ID band Patient awake    Reviewed: Allergy & Precautions, NPO status , Patient's Chart, lab work & pertinent test results  History of Anesthesia Complications Negative for: history of anesthetic complications  Airway Mallampati: II  TM Distance: >3 FB Neck ROM: Full    Dental  (+) Dental Advisory Given   Pulmonary neg pulmonary ROS, Current Smoker,    breath sounds clear to auscultation       Cardiovascular  Rhythm:Regular Rate:Normal  05/24/17 ECHO: EF 50-55%, valves OK   Neuro/Psych negative neurological ROS     GI/Hepatic negative GI ROS, (+)     substance abuse  IV drug use, Hepatitis -, C  Endo/Other  negative endocrine ROS  Renal/GU negative Renal ROS     Musculoskeletal  (+) Arthritis ,   Abdominal   Peds  Hematology  (+) Blood dyscrasia (Hb 10.0), anemia ,   Anesthesia Other Findings   Reproductive/Obstetrics                             Anesthesia Physical  Anesthesia Plan  ASA: III and emergent  Anesthesia Plan: General   Post-op Pain Management:    Induction: Intravenous, Rapid sequence and Cricoid pressure planned  PONV Risk Score and Plan: 2 and Treatment may vary due to age or medical condition and Ondansetron  Airway Management Planned: Oral ETT  Additional Equipment:   Intra-op Plan:   Post-operative Plan: Extubation in OR  Informed Consent: I have reviewed the patients History and Physical, chart, labs and discussed the procedure including the risks, benefits and alternatives for the proposed anesthesia with the patient or authorized representative who has indicated his/her understanding and acceptance.   Dental advisory given  Plan Discussed with: CRNA  Anesthesia Plan Comments:         Anesthesia Quick Evaluation

## 2018-02-22 NOTE — ED Notes (Signed)
Pt continues with periods where he becomes awake, agitated, yelling, biting at tubing, grabbing lines.

## 2018-02-23 ENCOUNTER — Encounter (HOSPITAL_COMMUNITY): Payer: Self-pay | Admitting: Orthopedic Surgery

## 2018-02-23 LAB — CBC WITH DIFFERENTIAL/PLATELET
Basophils Absolute: 0.1 10*3/uL (ref 0.0–0.1)
Basophils Relative: 0 %
Eosinophils Absolute: 0 10*3/uL (ref 0.0–0.7)
Eosinophils Relative: 0 %
HCT: 38.9 % — ABNORMAL LOW (ref 39.0–52.0)
Hemoglobin: 13 g/dL (ref 13.0–17.0)
Lymphocytes Relative: 8 %
Lymphs Abs: 2.9 10*3/uL (ref 0.7–4.0)
MCH: 29 pg (ref 26.0–34.0)
MCHC: 33.4 g/dL (ref 30.0–36.0)
MCV: 86.8 fL (ref 78.0–100.0)
Monocytes Absolute: 1.4 10*3/uL — ABNORMAL HIGH (ref 0.1–1.0)
Monocytes Relative: 4 %
Neutro Abs: 30 10*3/uL (ref 1.7–7.7)
Neutrophils Relative %: 88 %
Platelets: 605 10*3/uL — ABNORMAL HIGH (ref 150–400)
RBC: 4.48 MIL/uL (ref 4.22–5.81)
RDW: 15.2 % (ref 11.5–15.5)
WBC: 34.3 10*3/uL — ABNORMAL HIGH (ref 4.0–10.5)

## 2018-02-23 LAB — COMPREHENSIVE METABOLIC PANEL
ALK PHOS: 105 U/L (ref 38–126)
ALT: 226 U/L — ABNORMAL HIGH (ref 0–44)
ANION GAP: 8 (ref 5–15)
AST: 272 U/L — ABNORMAL HIGH (ref 15–41)
Albumin: 3 g/dL — ABNORMAL LOW (ref 3.5–5.0)
BILIRUBIN TOTAL: 0.9 mg/dL (ref 0.3–1.2)
BUN: 28 mg/dL — AB (ref 6–20)
CO2: 23 mmol/L (ref 22–32)
Calcium: 8.4 mg/dL — ABNORMAL LOW (ref 8.9–10.3)
Chloride: 113 mmol/L — ABNORMAL HIGH (ref 98–111)
Creatinine, Ser: 1.05 mg/dL (ref 0.61–1.24)
GFR calc Af Amer: >60 mL/min (ref >60)
Glucose, Bld: 105 mg/dL — ABNORMAL HIGH (ref 70–99)
POTASSIUM: 4 mmol/L (ref 3.5–5.1)
Sodium: 144 mmol/L (ref 135–145)
TOTAL PROTEIN: 6.9 g/dL (ref 6.5–8.1)

## 2018-02-23 LAB — CK: Total CK: 868 U/L — ABNORMAL HIGH (ref 49–397)

## 2018-02-23 LAB — MRSA PCR SCREENING: MRSA by PCR: POSITIVE — AB

## 2018-02-23 LAB — CBC
HCT: 29.8 % — ABNORMAL LOW (ref 39.0–52.0)
Hemoglobin: 9.7 g/dL — ABNORMAL LOW (ref 13.0–17.0)
MCH: 29 pg (ref 26.0–34.0)
MCHC: 32.6 g/dL (ref 30.0–36.0)
MCV: 89 fL (ref 78.0–100.0)
Platelets: 229 10*3/uL (ref 150–400)
RBC: 3.35 MIL/uL — ABNORMAL LOW (ref 4.22–5.81)
RDW: 14.8 % (ref 11.5–15.5)
WBC: 4.7 10*3/uL (ref 4.0–10.5)

## 2018-02-23 LAB — MAGNESIUM: MAGNESIUM: 2.2 mg/dL (ref 1.7–2.4)

## 2018-02-23 MED ORDER — VANCOMYCIN HCL 10 G IV SOLR
1250.0000 mg | Freq: Two times a day (BID) | INTRAVENOUS | Status: DC
  Administered 2018-02-23 (×2): 1250 mg via INTRAVENOUS
  Filled 2018-02-23 (×3): qty 1250

## 2018-02-23 MED ORDER — HALOPERIDOL LACTATE 5 MG/ML IJ SOLN
2.0000 mg | Freq: Three times a day (TID) | INTRAMUSCULAR | Status: DC
  Administered 2018-02-23 – 2018-02-24 (×3): 2 mg via INTRAVENOUS
  Filled 2018-02-23 (×3): qty 1

## 2018-02-23 MED ORDER — CHLORHEXIDINE GLUCONATE CLOTH 2 % EX PADS
6.0000 | MEDICATED_PAD | Freq: Every day | CUTANEOUS | Status: DC
  Administered 2018-02-23 – 2018-02-25 (×2): 6 via TOPICAL

## 2018-02-23 MED ORDER — MUPIROCIN 2 % EX OINT
1.0000 "application " | TOPICAL_OINTMENT | Freq: Two times a day (BID) | CUTANEOUS | Status: DC
  Administered 2018-02-23 – 2018-02-25 (×6): 1 via NASAL
  Filled 2018-02-23 (×2): qty 22

## 2018-02-23 NOTE — Progress Notes (Signed)
NAME:  Hunter [name], MRN:  [id number], DOB:  12/16/1985, LOS: 1 ADMISSION DATE:  02/21/2018, CONSULTATION DATE:  02/22/18 REFERRING MD:  Dr. Ashley Royalty, CHIEF COMPLAINT:  Substance abuse / withdrawal   Brief History   32 y/o M admitted 9/16 with agitated delirium. He carries a hx of polysubstance abuse, tourette's syndrome, Hep C+.    He was found on the third floor of a hotel impaired, threatening to jump.  He was reportedly erratic and required 6 people to restrain him. UDS was positive for amphetamines, opiates & cocaine.  Prior to admit, he had concerns for right index finger infection and had an I&D of his finger per orthopedics.  He was supposed to be taking Bactrim.  The patient received multiple doses of Ativan & Geodon and was admitted per [facility].  He continued to have intermittent periods of agitated delirium mixed with calm/redirectable state.  Hospital course notable for AKI and leukocytosis.  PCCM consulted on 9/17 for evaluation of delirium / withdrawal.   To OR 9/17 for right hand debridement of second digit.  Required precedex overnight 9/18.  Weaned off early am 9/18 to scheduled haldol.     Significant Hospital Events   9/16 Admit with AMS 9/17 PCCM consulted.  To OR for right hand debridement    Consults: date of consult/date signed off & final recs:  PCCM 9/17   Procedures (surgical and bedside):    Significant Diagnostic Tests:  9/16  CT Head >> negative  9/16  XR Hand >> soft tissue wound along the radial aspect of the right index finger, no acute osseous abnormality, healed chronic 5th metacarpal fracture  Micro Data: BCx2 9/17 >>  Antimicrobials:  Vancomycin 9/17 >>   Subjective:  Pt required precedex overnight.  Rested around 0300.  Periods of intermittent agitation.   Objective   Blood pressure (!) 140/97, pulse 71, temperature 97.8 F (36.6 C), temperature source Axillary, resp. rate (!) 37, SpO2 99 %.        Intake/Output Summary (Last 24 hours)  at 02/23/2018 0837 Last data filed at 02/23/2018 0654 Gross per 24 hour  Intake 4478.78 ml  Output 1035 ml  Net 3443.78 ml   There were no vitals filed for this visit.  Examination: General: young adult male lying in bed in NAD HEENT: MM pink/moist Neuro: sedate, moves all extremities spontaneously  CV: s1s2 rrr, no m/r/g PULM: even/non-labored, lungs bilaterally clear  WU:JWJX, non-tender, bsx4 active  Extremities: warm/dry, no edema, R hand in dressing, multiple linear scars on body (?cutting) Skin: no rashes, multiple tattoos  Resolved Hospital Problem list     Assessment & Plan:   Polysubstance Abuse with Agitated Delirium - concern for withdrawal vs acute intoxication  P: Haldol 2mg  IV Q8 with PRN  Monitor QTc closely with haldol  SDU monitoring  Safety sitter  Turn precedex off  PRN ativan    AKI - suspect multifactorial, mild Rhabdo in setting of severe agitation, volume depletion & vancomycin  P: NS at 155ml/hr Trend BMP / urinary output Replace electrolytes as indicated Avoid nephrotoxic agents, ensure adequate renal perfusion   Right Hand Cellulitis  P: Post-operative recommendations per Dr. Janee Morn  Vancomycin for R hand, D2/x    Thrombocytosis - ? related to infection + drug intoxication, surgery  P: Monitor    Elevated LFT's  P: Monitor  Disposition / Summary of Today's Plan 02/23/18   SDU monitoring, discontinue precedex.  Begin scheduled haldol.  Monitor QTc.  To [facility]  9/19.     Diet: NPO  Pain/Anxiety/Delirium protocol (if indicated): n/a  VAP protocol (if indicated) n/a DVT prophylaxis: heparin  GI prophylaxis: n/a Hyperglycemia protocol: n/a Mobility: as tolerated  Code Status: Full Code  Family Communication: No family at bedside  Labs   CBC: Recent Labs  Lab 02/21/18 2009 02/23/18 0741  WBC 34.3* 4.7  NEUTROABS 30.0  --   HGB 13.0 9.7*  HCT 38.9* 29.8*  MCV 86.8 89.0  PLT 605* 229   Basic Metabolic Panel: Recent  Labs  Lab 02/21/18 2048 02/22/18 0500 02/23/18 0610 02/23/18 0741  NA 148* 147* QUESTIONABLE RESULTS, RECOMMEND RECOLLECT TO VERIFY 144  K 3.6 4.0 QUESTIONABLE RESULTS, RECOMMEND RECOLLECT TO VERIFY 4.0  CL 107 113* QUESTIONABLE RESULTS, RECOMMEND RECOLLECT TO VERIFY 113*  CO2 24 23 QUESTIONABLE RESULTS, RECOMMEND RECOLLECT TO VERIFY 23  GLUCOSE 83 91 QUESTIONABLE RESULTS, RECOMMEND RECOLLECT TO VERIFY 105*  BUN 26* 35* QUESTIONABLE RESULTS, RECOMMEND RECOLLECT TO VERIFY 28*  CREATININE 2.06* 1.61* QUESTIONABLE RESULTS, RECOMMEND RECOLLECT TO VERIFY 1.05  CALCIUM 10.0 8.6* QUESTIONABLE RESULTS, RECOMMEND RECOLLECT TO VERIFY 8.4*  MG  --   --  QUESTIONABLE RESULTS, RECOMMEND RECOLLECT TO VERIFY 2.2   GFR: Estimated Creatinine Clearance: 115.2 mL/min (by C-G formula based on SCr of 1.05 mg/dL). Recent Labs  Lab 02/21/18 2009 02/23/18 0741  WBC 34.3* 4.7   Liver Function Tests: Recent Labs  Lab 02/21/18 2048 02/23/18 0610 02/23/18 0741  AST 67* QUESTIONABLE RESULTS, RECOMMEND RECOLLECT TO VERIFY 272*  ALT 41 QUESTIONABLE RESULTS, RECOMMEND RECOLLECT TO VERIFY 226*  ALKPHOS 159* QUESTIONABLE RESULTS, RECOMMEND RECOLLECT TO VERIFY 105  BILITOT 1.4* QUESTIONABLE RESULTS, RECOMMEND RECOLLECT TO VERIFY 0.9  PROT 9.5* QUESTIONABLE RESULTS, RECOMMEND RECOLLECT TO VERIFY 6.9  ALBUMIN 4.5 QUESTIONABLE RESULTS, RECOMMEND RECOLLECT TO VERIFY 3.0*   No results for input(s): LIPASE, AMYLASE in the last 168 hours. No results for input(s): AMMONIA in the last 168 hours.   ABG No results found for: PHART, PCO2ART, PO2ART, HCO3, TCO2, ACIDBASEDEF, O2SAT   Coagulation Profile: No results for input(s): INR, PROTIME in the last 168 hours.   Cardiac Enzymes: Recent Labs  Lab 02/22/18 1527 02/23/18 0610 02/23/18 0741  CKTOTAL 2,006* QUESTIONABLE RESULTS, RECOMMEND RECOLLECT TO VERIFY 868*     HbA1C: No results found for: HGBA1C   CBG: Recent Labs  Lab 02/21/18 1825 02/21/18 2148    GLUCAP 61* 82     Canary BrimBrandi Malgorzata Albert, NP-C [facility] Pgr: [phone] or if no answer [phone] 02/23/2018, 8:37 AM

## 2018-02-23 NOTE — Progress Notes (Signed)
PT Cancellation Note  Patient Details Name: Hunter [name] MRN: [id number] DOB: [birth year]   Cancelled Treatment:    Reason Eval/Treat Not Completed: Medical issues which prohibited therapy Order for Hydrotherapy  Received. Patient is on Precedex currently. Will check on patient  When stable and able to follow directions.. Need more information in regards to what hydrotherapy treatment is desired by MD.    Rada HayHill, Sayge Salvato Elizabeth 02/23/2018, 7:50 AM Blanchard KelchKaren Joniece Smotherman PT Acute [facility] Services Pager [phone] Office [phone]

## 2018-02-23 NOTE — Progress Notes (Signed)
Pharmacy Antibiotic Note  Hunter [name] is a 32 y.o. male admitted on 02/21/2018 with polysubstance abuse, delirium, and cellulitis.  PMH includes recent admission for deep wound on right index finger s/p I&D with +MRSA culture, discharged on PO Bactrim.  Unclear if he was taking Bactrim PTA, but plans for re-exploration, suture removal, and I&D of finger on 9/17.  Pharmacy has been consulted for Vancomycin dosing.  Plan:  Vancomycin 1250 mg IV q12h.  Check vancomycin levels if remains on vancomycin > 3-4 days.  Goal AUC 400-500.  Follow up renal fxn, culture results, and clinical course.     Temp (24hrs), Avg:98.1 F (36.7 C), Min:97.8 F (36.6 C), Max:98.8 F (37.1 C)  Recent Labs  Lab 02/21/18 2009 02/21/18 2048 02/22/18 0500 02/23/18 0610 02/23/18 0741  WBC 34.3*  --   --   --  4.7  CREATININE  --  2.06* 1.61* QUESTIONABLE RESULTS, RECOMMEND RECOLLECT TO VERIFY 1.05    Estimated Creatinine Clearance: 115.2 mL/min (by C-G formula based on SCr of 1.05 mg/dL).    No Known Allergies  Antimicrobials this admission: 9/17 Vancomycin >>  Dose adjustments this admission: 9/18 SCr improving, vanc dose re-calculated and increased from 1250mg  IV q24h to 1250mg  IV q12h.  Microbiology results:  9/16 BCx: ngtd 9/17 MRSA PCR: positive  Thank you for allowing pharmacy to be a part of this patient's care.  Lynann Beaverhristine Anniston Nellums PharmD, BCPS Pager [phone] 02/23/2018 8:52 AM

## 2018-02-24 ENCOUNTER — Other Ambulatory Visit: Payer: Self-pay

## 2018-02-24 DIAGNOSIS — F1910 Other psychoactive substance abuse, uncomplicated: Secondary | ICD-10-CM

## 2018-02-24 DIAGNOSIS — F17210 Nicotine dependence, cigarettes, uncomplicated: Secondary | ICD-10-CM

## 2018-02-24 LAB — COMPREHENSIVE METABOLIC PANEL
ALBUMIN: 2.9 g/dL — AB (ref 3.5–5.0)
ALK PHOS: 85 U/L (ref 38–126)
ALT: 183 U/L — ABNORMAL HIGH (ref 0–44)
AST: 142 U/L — ABNORMAL HIGH (ref 15–41)
Anion gap: 7 (ref 5–15)
BUN: 12 mg/dL (ref 6–20)
CO2: 24 mmol/L (ref 22–32)
CREATININE: 0.67 mg/dL (ref 0.61–1.24)
Calcium: 8.3 mg/dL — ABNORMAL LOW (ref 8.9–10.3)
Chloride: 113 mmol/L — ABNORMAL HIGH (ref 98–111)
GFR calc Af Amer: >60 mL/min (ref >60)
GFR calc non Af Amer: >60 mL/min (ref >60)
Glucose, Bld: 100 mg/dL — ABNORMAL HIGH (ref 70–99)
Potassium: 3.4 mmol/L — ABNORMAL LOW (ref 3.5–5.1)
SODIUM: 144 mmol/L (ref 135–145)
TOTAL PROTEIN: 6.5 g/dL (ref 6.5–8.1)
Total Bilirubin: 0.8 mg/dL (ref 0.3–1.2)

## 2018-02-24 LAB — CBC
HCT: 30 % — ABNORMAL LOW (ref 39.0–52.0)
HEMOGLOBIN: 9.7 g/dL — AB (ref 13.0–17.0)
MCH: 28.8 pg (ref 26.0–34.0)
MCHC: 32.3 g/dL (ref 30.0–36.0)
MCV: 89 fL (ref 78.0–100.0)
Platelets: 244 10*3/uL (ref 150–400)
RBC: 3.37 MIL/uL — ABNORMAL LOW (ref 4.22–5.81)
RDW: 15 % (ref 11.5–15.5)
WBC: 4.4 10*3/uL (ref 4.0–10.5)

## 2018-02-24 MED ORDER — ORAL CARE MOUTH RINSE
15.0000 mL | Freq: Two times a day (BID) | OROMUCOSAL | Status: DC
  Administered 2018-02-25: 15 mL via OROMUCOSAL

## 2018-02-24 MED ORDER — VANCOMYCIN HCL 10 G IV SOLR
2000.0000 mg | Freq: Two times a day (BID) | INTRAVENOUS | Status: DC
  Administered 2018-02-24 – 2018-02-25 (×3): 2000 mg via INTRAVENOUS
  Filled 2018-02-24 (×3): qty 2000

## 2018-02-24 MED ORDER — QUETIAPINE FUMARATE 25 MG PO TABS
25.0000 mg | ORAL_TABLET | Freq: Two times a day (BID) | ORAL | Status: DC
  Administered 2018-02-24 – 2018-02-25 (×3): 25 mg via ORAL
  Filled 2018-02-24 (×3): qty 1

## 2018-02-24 MED ORDER — POTASSIUM CHLORIDE CRYS ER 20 MEQ PO TBCR
20.0000 meq | EXTENDED_RELEASE_TABLET | ORAL | Status: AC
  Administered 2018-02-24 (×2): 20 meq via ORAL
  Filled 2018-02-24 (×2): qty 1

## 2018-02-24 NOTE — Consult Note (Signed)
[facility] Consult   Reason for Consult:  SI Referring Physician:  Dr. Patrecia Pour Patient Identification: Hunter [name] [facility] MRN:  [id number] Principal Diagnosis: Polysubstance abuse [facility]) Diagnosis:   Patient Active Problem List   Diagnosis Date Noted  . Toxic encephalopathy [G92] 02/22/2018  . AKI (acute kidney injury) ([place]) [N17.9] 02/22/2018  . Acute delirium [R41.0]   . Finger infection [L08.9]   . Cocaine abuse ([place]) [F14.10]   . Acute renal failure ([place]) [N17.9]   . Non-traumatic rhabdomyolysis [M62.82]   . Cellulitis and abscess of hand [L03.119, L02.519] 02/14/2018  . Cellulitis of right hand [L03.113]   . Abscess of right index finger [L02.511]   . Atelectasis of both lungs [J98.11]   . Chest wall abscess [L02.213]   . Psoas abscess ([place]) [K68.12] 05/23/2017  . Bacteremia due to methicillin susceptible Staphylococcus aureus (MSSA) [R78.81] 05/23/2017  . Discitis of lumbar region [M46.46] 05/22/2017  . Hypokalemia [E87.6] 05/22/2017  . Normocytic anemia [D64.9] 04/28/2017  . Chronic hepatitis C without hepatic coma ([place]) [B18.2] 04/28/2017  . IVDU (intravenous drug user) [F19.90] 04/27/2017  . Sepsis ([place]) [A41.9] 04/26/2017    Total Time spent with patient: 1 hour  Subjective:   Hunter [name] is a 32 y.o. male patient admitted with polysubstance abuse with agitated delirium.  HPI:   Per chart review, patient was admitted with polysubstance abuse with agitated delirium. He was found on the third floor of a hotel and was threatening to jump. He was agitated with erratic behavior. He required 6 people to restrain him. He received multiple doses of Geodon and Ativan but remained agitated. He required ICU admission with Precedex for admission. UDS was positive for amphetamines, opiates and cocaine and BAL was negative. He is receiving treatment for right finger cellulitis and abscess s/p debridement. He is under IVC due to SI. He was receiving Haldol  2 mg q 8 hours (discontinued today) and Seroquel 25 mg BID was started today.   On interview, Hunter [name] denies recalling the events that led to his hospitalization.  He reports that he has never been suicidal and believes that the bizarre behavior reported was due to methamphetamine use.  He reports using methamphetamine for the past 2 months.  He also reports cocaine use.  He denies recreational opiate use and reports that he was prescribed narcotic pain pills for pain in his finger due to infection.  He reports that this pain does not feel better.  He denies current SI, HI or AVH.  He reports that he is sleeping and eating okay.  He denies a history of depression or anxiety.  He reports completing rehab last year.  He was sober for several months while he was living with his children and his wife.    Past Psychiatric History: Tourette's disorder   Risk to Self:  None. Denies SI.  Risk to Others:  None. Denies HI.  Prior Inpatient Therapy:  Denies  Prior Outpatient Therapy:  Denies   Past Medical History:  Past Medical History:  Diagnosis Date  . Chronic hepatitis C without hepatic coma ([place]) 04/28/2017  . Hepatitis-C   . IVDU (intravenous drug user)   . Tourette's     Past Surgical History:  Procedure Laterality Date  . I&D EXTREMITY Right 04/26/2017   Procedure: IRRIGATION AND DEBRIDEMENT RIGHT HAND AND FOREARM;  Surgeon: Iran Planas, MD;  Location: [facility];  Service: Orthopedics;  Laterality: Right;  . I&D EXTREMITY Right 02/14/2018   Procedure: IRRIGATION  AND DEBRIDEMENT RIGHT INDEX FINGER ;  Surgeon: Milly Jakob, MD;  Location: [facility];  Service: Orthopedics;  Laterality: Right;  . I&D EXTREMITY Right 02/22/2018   Procedure: IRRIGATION AND DEBRIDEMENT EXTREMITY;  Surgeon: Milly Jakob, MD;  Location: [facility];  Service: Orthopedics;  Laterality: Right;  . TEE WITHOUT CARDIOVERSION N/A 04/30/2017   Procedure: TRANSESOPHAGEAL ECHOCARDIOGRAM (TEE);  Surgeon: Sueanne Margarita, MD;   Location: [facility] ENDOSCOPY;  Service: Cardiovascular;  Laterality: N/A;  . TEE WITHOUT CARDIOVERSION N/A 05/28/2017   Procedure: TRANSESOPHAGEAL ECHOCARDIOGRAM (TEE);  Surgeon: Fay Records, MD;  Location: [facility] ENDOSCOPY;  Service: Cardiovascular;  Laterality: N/A;  . TONSILLECTOMY     Family History: History reviewed. No pertinent family history. Family Psychiatric  History: Denies  Social History:  Social History   Substance and Sexual Activity  Alcohol Use No  . Frequency: Never     Social History   Substance and Sexual Activity  Drug Use Yes  . Types: IV   Comment: heroin    Social History   Socioeconomic History  . Marital status: Divorced    Spouse name: Not on file  . Number of children: Not on file  . Years of education: Not on file  . Highest education level: Not on file  Occupational History  . Not on file  Social Needs  . Financial resource strain: Not on file  . Food insecurity:    Worry: Not on file    Inability: Not on file  . Transportation needs:    Medical: Not on file    Non-medical: Not on file  Tobacco Use  . Smoking status: Current Every Day Smoker    Types: Cigarettes  . Smokeless tobacco: Never Used  Substance and Sexual Activity  . Alcohol use: No    Frequency: Never  . Drug use: Yes    Types: IV    Comment: heroin  . Sexual activity: Not on file  Lifestyle  . Physical activity:    Days per week: Not on file    Minutes per session: Not on file  . Stress: Not on file  Relationships  . Social connections:    Talks on phone: Not on file    Gets together: Not on file    Attends religious service: Not on file    Active member of club or organization: Not on file    Attends meetings of clubs or organizations: Not on file    Relationship status: Not on file  Other Topics Concern  . Not on file  Social History Narrative  . Not on file   Additional Social History: He lives at home alone. He has been divorced from his ex-wife for 3 years.  He has a 63 and 6 y/o child. He is unemployed for the past month. He was previously doing odd jobs. He reports regular methamphetamine and cocaine use. He denies alcohol use.     Allergies:  No Known Allergies  Labs:  Results for orders placed or performed during the hospital encounter of 02/21/18 (from the past 48 hour(s))  CK     Status: Abnormal   Collection Time: 02/22/18  3:27 PM  Result Value Ref Range   Total CK 2,006 (H) 49 - 397 U/L    Comment: Performed at [facility], [place] [address], [place], [place] [address]  MRSA PCR Screening     Status: Abnormal   Collection Time: 02/22/18  6:16 PM  Result Value Ref Range   MRSA  by PCR POSITIVE (A) NEGATIVE    Comment:        The GeneXpert MRSA Assay (FDA approved for NASAL specimens only), is one component of a comprehensive MRSA colonization surveillance program. It is not intended to diagnose MRSA infection nor to guide or monitor treatment for MRSA infections. RESULT CALLED TO, READ BACK BY AND VERIFIED WITH: Charlsie Merles RN 9024 02/23/18 A NAVARRO Performed at [facility], [place] [address] [place], [place] [address]   Comprehensive metabolic panel     Status: None   Collection Time: 02/23/18  6:10 AM  Result Value Ref Range   Sodium  135 - 145 mmol/L    QUESTIONABLE RESULTS, RECOMMEND RECOLLECT TO VERIFY    Comment: INFORMED RN @0720  ON 9.18.19 BY NMCCOY CORRECTED ON 09/18 AT 0726: PREVIOUSLY REPORTED AS 144    Potassium  3.5 - 5.1 mmol/L    QUESTIONABLE RESULTS, RECOMMEND RECOLLECT TO VERIFY    Comment: INFORMED RN @0720  ON 9.18.19 BY NMCCOY CORRECTED ON 09/18 AT 3299: PREVIOUSLY REPORTED AS 4.0    Chloride  98 - 111 mmol/L    QUESTIONABLE RESULTS, RECOMMEND RECOLLECT TO VERIFY    Comment: INFORMED RN @0720  ON 9.18.19 BY [facility]OY CORRECTED ON 09/18 AT 0726: PREVIOUSLY REPORTED AS 113    CO2  22 - 32 mmol/L    QUESTIONABLE RESULTS, RECOMMEND RECOLLECT TO VERIFY    Comment: INFORMED  RN @0720  ON 9.18.19 BY [facility]OY CORRECTED ON 09/18 AT 0726: PREVIOUSLY REPORTED AS 22    Glucose, Bld  70 - 99 mg/dL    QUESTIONABLE RESULTS, RECOMMEND RECOLLECT TO VERIFY    Comment: INFORMED RN @0720  ON 9.18.19 BY [facility]OY CORRECTED ON 09/18 AT 0726: PREVIOUSLY REPORTED AS 109    BUN  6 - 20 mg/dL    QUESTIONABLE RESULTS, RECOMMEND RECOLLECT TO VERIFY    Comment: INFORMED RN @0720  ON 9.18.19 BY [facility]OY CORRECTED ON 09/18 AT 2426: PREVIOUSLY REPORTED AS 28    Creatinine, Ser  0.61 - 1.24 mg/dL    QUESTIONABLE RESULTS, RECOMMEND RECOLLECT TO VERIFY    Comment: INFORMED RN @0720  ON 9.18.19 BY [facility]OY CORRECTED ON 09/18 AT 8341: PREVIOUSLY REPORTED AS 1.11    Calcium  8.9 - 10.3 mg/dL    QUESTIONABLE RESULTS, RECOMMEND RECOLLECT TO VERIFY    Comment: INFORMED RN @0720  ON 9.18.19 BY [facility]OY CORRECTED ON 09/18 AT 9622: PREVIOUSLY REPORTED AS 8.5    Total Protein  6.5 - 8.1 g/dL    QUESTIONABLE RESULTS, RECOMMEND RECOLLECT TO VERIFY    Comment: INFORMED RN @0720  ON 9.18.19 BY [facility] CORRECTED ON 09/18 AT 0726: PREVIOUSLY REPORTED AS 6.8    Albumin  3.5 - 5.0 g/dL    QUESTIONABLE RESULTS, RECOMMEND RECOLLECT TO VERIFY    Comment: INFORMED RN @0720  ON 9.18.19 BY [facility] CORRECTED ON 09/18 AT 2979: PREVIOUSLY REPORTED AS 3.1    AST  15 - 41 U/L    QUESTIONABLE RESULTS, RECOMMEND RECOLLECT TO VERIFY    Comment: INFORMED RN @0720  ON 9.18.19 BY [facility] CORRECTED ON 09/18 AT 0726: PREVIOUSLY REPORTED AS 280    ALT  0 - 44 U/L    QUESTIONABLE RESULTS, RECOMMEND RECOLLECT TO VERIFY    Comment: INFORMED RN @0720  ON 9.18.19 BY [facility] CORRECTED ON 09/18 AT 0726: PREVIOUSLY REPORTED AS 222    Alkaline Phosphatase  38 - 126 U/L    QUESTIONABLE RESULTS, RECOMMEND RECOLLECT TO VERIFY    Comment: INFORMED RN @0720  ON 9.18.19 BY [facility] CORRECTED ON 09/18 AT 0726:  PREVIOUSLY REPORTED AS 103    Total Bilirubin  0.3 - 1.2 mg/dL    QUESTIONABLE RESULTS, RECOMMEND RECOLLECT TO VERIFY    Comment:  INFORMED RN @0720  ON 9.18.19 BY [facility] CORRECTED ON 09/18 AT 3151: PREVIOUSLY REPORTED AS 1.1    GFR calc non Af Amer  >60 mL/min    QUESTIONABLE RESULTS, RECOMMEND RECOLLECT TO VERIFY    Comment: INFORMED RN @0720  ON 9.18.19 BY [facility] CORRECTED ON 09/18 AT 7616: PREVIOUSLY REPORTED AS >60    GFR calc Af Amer  >60 mL/min    QUESTIONABLE RESULTS, RECOMMEND RECOLLECT TO VERIFY    Comment: INFORMED RN @0720  ON 9.18.19 BY [facility]CCOY CORRECTED ON 09/18 AT 0726: PREVIOUSLY REPORTED AS >60    Anion gap  5 - 15    QUESTIONABLE RESULTS, RECOMMEND RECOLLECT TO VERIFY    Comment: INFORMED RN @0720  ON 9.18.19 BY [facility] Performed at [facility], [place] [address] [place], [place] [address] CORRECTED ON 09/18 AT 0626: PREVIOUSLY REPORTED AS 9   Magnesium     Status: None   Collection Time: 02/23/18  6:10 AM  Result Value Ref Range   Magnesium  1.7 - 2.4 mg/dL    QUESTIONABLE RESULTS, RECOMMEND RECOLLECT TO VERIFY    Comment: INFORMED RN @0720  ON 9.18.19 BY [facility] Performed at [facility], [place] [address] [place], [place] [address] CORRECTED ON 09/18 AT 6270: PREVIOUSLY REPORTED AS 2.2   CK     Status: None   Collection Time: 02/23/18  6:10 AM  Result Value Ref Range   Total CK  49 - 397 U/L    QUESTIONABLE RESULTS, RECOMMEND RECOLLECT TO VERIFY    Comment: INFORMED RN @0720  ON 9.18.19 BY [facility] Performed at [facility], [place] [address] [place], [place] [address] CORRECTED ON 09/18 AT 3818: PREVIOUSLY REPORTED AS 992   CBC     Status: Abnormal   Collection Time: 02/23/18  7:41 AM  Result Value Ref Range   WBC 4.7 4.0 - 10.5 K/uL    Comment: RESULTS VERIFIED VIA RECOLLECT   RBC 3.35 (L) 4.22 - 5.81 MIL/uL   Hemoglobin 9.7 (L) 13.0 - 17.0 g/dL    Comment: DELTA CHECK NOTED REPEATED TO VERIFY RESULTS VERIFIED VIA RECOLLECT    HCT 29.8 (L) 39.0 - 52.0 %   MCV 89.0 78.0 - 100.0 fL   MCH 29.0 26.0 - 34.0 pg   MCHC 32.6 30.0 - 36.0  g/dL   RDW 14.8 11.5 - 15.5 %   Platelets 229 150 - 400 K/uL    Comment: DELTA CHECK NOTED REPEATED TO VERIFY SPECIMEN CHECKED FOR CLOTS RESULTS VERIFIED VIA RECOLLECT Performed at [facility], [place] [address] [place], [place] [address]   CK     Status: Abnormal   Collection Time: 02/23/18  7:41 AM  Result Value Ref Range   Total CK 868 (H) 49 - 397 U/L    Comment: Performed at [facility], [place] [address] [place], [place] [address]  Comprehensive metabolic panel     Status: Abnormal   Collection Time: 02/23/18  7:41 AM  Result Value Ref Range   Sodium 144 135 - 145 mmol/L   Potassium 4.0 3.5 - 5.1 mmol/L   Chloride 113 (H) 98 - 111 mmol/L   CO2 23 22 - 32 mmol/L   Glucose, Bld 105 (H) 70 - 99 mg/dL   BUN 28 (H) 6 - 20 mg/dL   Creatinine, Ser 1.05  0.61 - 1.24 mg/dL   Calcium 8.4 (L) 8.9 - 10.3 mg/dL   Total Protein 6.9 6.5 - 8.1 g/dL   Albumin 3.0 (L) 3.5 - 5.0 g/dL   AST 272 (H) 15 - 41 U/L   ALT 226 (H) 0 - 44 U/L   Alkaline Phosphatase 105 38 - 126 U/L   Total Bilirubin 0.9 0.3 - 1.2 mg/dL   GFR calc non Af Amer >60 >60 mL/min   GFR calc Af Amer >60 >60 mL/min    Comment: (NOTE) The eGFR has been calculated using the CKD EPI equation. This calculation has not been validated in all clinical situations. eGFR's persistently <60 mL/min signify possible Chronic Kidney Disease.    Anion gap 8 5 - 15    Comment: Performed at [facility], [place] [address], [place], [place] [address]  Magnesium     Status: None   Collection Time: 02/23/18  7:41 AM  Result Value Ref Range   Magnesium 2.2 1.7 - 2.4 mg/dL    Comment: Performed at [facility], [place] [address], [place], [place] [address]  CBC     Status: Abnormal   Collection Time: 02/24/18  3:37 AM  Result Value Ref Range   WBC 4.4 4.0 - 10.5 K/uL   RBC 3.37 (L) 4.22 - 5.81 MIL/uL   Hemoglobin 9.7 (L) 13.0 - 17.0 g/dL   HCT 30.0 (L) 39.0 - 52.0 %    MCV 89.0 78.0 - 100.0 fL   MCH 28.8 26.0 - 34.0 pg   MCHC 32.3 30.0 - 36.0 g/dL   RDW 15.0 11.5 - 15.5 %   Platelets 244 150 - 400 K/uL    Comment: Performed at [facility], [place] [address] [place], [place] [address]  Comprehensive metabolic panel     Status: Abnormal   Collection Time: 02/24/18  3:37 AM  Result Value Ref Range   Sodium 144 135 - 145 mmol/L   Potassium 3.4 (L) 3.5 - 5.1 mmol/L   Chloride 113 (H) 98 - 111 mmol/L   CO2 24 22 - 32 mmol/L   Glucose, Bld 100 (H) 70 - 99 mg/dL   BUN 12 6 - 20 mg/dL   Creatinine, Ser 0.67 0.61 - 1.24 mg/dL   Calcium 8.3 (L) 8.9 - 10.3 mg/dL   Total Protein 6.5 6.5 - 8.1 g/dL   Albumin 2.9 (L) 3.5 - 5.0 g/dL   AST 142 (H) 15 - 41 U/L   ALT 183 (H) 0 - 44 U/L   Alkaline Phosphatase 85 38 - 126 U/L   Total Bilirubin 0.8 0.3 - 1.2 mg/dL   GFR calc non Af Amer >60 >60 mL/min   GFR calc Af Amer >60 >60 mL/min    Comment: (NOTE) The eGFR has been calculated using the CKD EPI equation. This calculation has not been validated in all clinical situations. eGFR's persistently <60 mL/min signify possible Chronic Kidney Disease.    Anion gap 7 5 - 15    Comment: Performed at [facility], [place] [address] [place], [place] [address]    Current Facility-Administered Medications  Medication Dose Route Frequency Provider Last Rate Last Dose  . acetaminophen (TYLENOL) tablet 650 mg  650 mg Oral Q6H PRN Patrecia Pour, Christean Grief, MD       Or  . acetaminophen (TYLENOL) suppository 650 mg  650 mg Rectal Q6H PRN Patrecia Pour, Christean Grief, MD      . bacitracin ointment   Topical  BID Patrecia Pour, Christean Grief, MD      . Chlorhexidine Gluconate Cloth 2 % PADS 6 each  6 each Topical Q0600 Patrecia Pour, Christean Grief, MD   6 each at 02/23/18 [phone]  . haloperidol lactate (HALDOL) injection 5 mg  5 mg Intravenous Q3H PRN Patrecia Pour, Christean Grief, MD   5 mg at 02/22/18 2229  . heparin injection 5,000 Units  5,000 Units Subcutaneous Q8H Patrecia Pour,  Christean Grief, MD   5,000 Units at 02/24/18 0533  . LORazepam (ATIVAN) injection 2 mg  2 mg Intravenous Q2H PRN Patrecia Pour, Christean Grief, MD   2 mg at 02/23/18 0024  . MEDLINE mouth rinse  15 mL Mouth Rinse BID Patrecia Pour, Christean Grief, MD      . mupirocin ointment (BACTROBAN) 2 % 1 application  1 application Nasal BID Patrecia Pour, Christean Grief, MD   1 application at 39/76/73 1049  . ondansetron (ZOFRAN) tablet 4 mg  4 mg Oral Q6H PRN Patrecia Pour, Christean Grief, MD       Or  . ondansetron [facility]) injection 4 mg  4 mg Intravenous Q6H PRN Patrecia Pour, Christean Grief, MD      . QUEtiapine (SEROQUEL) tablet 25 mg  25 mg Oral BID Patrecia Pour, Christean Grief, MD   25 mg at 02/24/18 1045  . vancomycin (VANCOCIN) 2,000 mg in sodium chloride 0.9 % 500 mL IVPB  2,000 mg Intravenous Q12H Shade, Christine E, RPH 250 mL/hr at 02/24/18 1045 2,000 mg at 02/24/18 1045    Musculoskeletal: Strength & Muscle Tone: within normal limits Gait & Station: UTA since patient is lying in bed. Patient leans: N/A  Psychiatric Specialty Exam: Physical Exam  Nursing note and vitals reviewed. Constitutional: He is oriented to person, place, and time. He appears well-developed and well-nourished.  HENT:  Head: Normocephalic and atraumatic.  Neck: Normal range of motion.  Respiratory: Effort normal.  Musculoskeletal: Normal range of motion.  Neurological: He is alert and oriented to person, place, and time.  Psychiatric: He has a normal mood and affect. His behavior is normal. Judgment and thought content normal. Cognition and memory are normal.    Review of Systems  Constitutional: Negative for chills and fever.  Cardiovascular: Negative for chest pain.  Gastrointestinal: Negative for abdominal pain, constipation, diarrhea, nausea and vomiting.  Psychiatric/Behavioral: Positive for substance abuse. Negative for depression, hallucinations and suicidal ideas. The patient is not nervous/anxious and does not have insomnia.   All other systems reviewed and are  negative.   Blood pressure 139/87, pulse 74, temperature 98.9 F (37.2 C), temperature source Oral, resp. rate (!) 24, height 6' (1.829 m), weight 81 kg, SpO2 100 %.Body mass index is 24.22 kg/m.  General Appearance: Fairly Groomed, young, Caucasian male, wearing a hospital gown with body tattoos who is lying in bed. NAD.   Eye Contact:  Good  Speech:  Clear and Coherent and Normal Rate. History of tourette's and has vocal tics.    Volume:  Normal  Mood:  Euthymic  Affect:  Appropriate and Congruent  Thought Process:  Goal Directed, Linear and Descriptions of Associations: Intact  Orientation:  Full (Time, Place, and Person)  Thought Content:  Logical  Suicidal Thoughts:  No  Homicidal Thoughts:  No  Memory:  Immediate;   Good Recent;   Good Remote;   Good  Judgement:  Fair  Insight:  Fair  Psychomotor Activity:  Normal  Concentration:  Concentration: Good and Attention Span: Good  Recall:  Good  Fund of Knowledge:  Good  Language:  Good  Akathisia:  No  Handed:  Right  AIMS (if indicated):   N/A  Assets:  Communication Skills Housing  ADL's:  Intact  Cognition:  WNL  Sleep:   Okay   Assessment:  Hunter [name] is a 32 y.o. male who was admitted with agitated delirium in the setting of methamphetamine and cocaine use. He is clear in mental status. He denies SI, HI or AVH. He denies mood symptoms or a history of suicide attempts. He does not warrant inpatient psychiatric hospitalization at this time. He declines substance abuse treatment resources.   Treatment Plan Summary: -Continue Seroquel 25 mg BID for agitation. May also be helpful for vocal tics. Patient believes that they are improved since starting medication. -Patient offered substance abuse treatment resources but declines.  -EKG reviewed and QTc 468. Please closely monitor when starting or increasing QTc prolonging agents.  -Psychiatry will sign off on patient at this time. Please consult psychiatry again as  needed.   Disposition: No evidence of imminent risk to self or others at present.   Patient does not meet criteria for psychiatric inpatient admission.  Faythe Dingwall, DO 02/24/2018 1:08 PM

## 2018-02-24 NOTE — Progress Notes (Signed)
Per Dr. Sharma CovertNorman DO, patient has been cleared by psychiatry on 9/19. CSW rescinded IVC at 15:50 on 9/19.   Stacy GardnerErin Amery Vandenbos, LCSW Clinical Social Worker  [facility]  [phone]

## 2018-02-24 NOTE — Evaluation (Signed)
Physical Therapy Hydrotherapy Evaluation Patient Details Name: Hunter MountsDavid Anthony [name] MRN: [id number] DOB: 1985/08/01 Today's Date: 02/24/2018   History of Present Illness  Pt is a 32 year old male S/p R IF tip excisional debridement on 02/22/18.  PMH of Tourette's, IVDU, Hep C  Clinical Impression  Pt seen with RN at bedside.  Pt with moderate sanguinous exudate on dressing.  Surgically debrided right index finger with sutures in place.  Wound looks clean and has no necrotic tissue. Dry wound bed observed distally with exposed tendon.  Recommended RN follow ordered wound care instructions and encouraged to use syringe for cleaning wound with soap/water as well as place saline moistened 2x2 guaze distally (slight depth as tendon exposed).  No further hydrotherapy recommendations at this time.  PT Hydrotherapy to sign off.    Follow Up Recommendations  Per Dr. Janee Mornhompson        End of Session  Left in bed with RN to redress wound.            Time: [id number] PT Time Calculation (min) (ACUTE ONLY): 14 min   Charges:   PT Evaluation $PT Eval Low Complexity: 1 Low  Hydrotherapy visit        Zenovia JarredKati Alyx Mcguirk, PT, DPT Acute Rehabilitation Services Office: [phone] Pager: [phone]   Sarajane JewsLEMYRE,KATHrine E 02/24/2018, 4:05 PM

## 2018-02-24 NOTE — Progress Notes (Signed)
PROGRESS NOTE [facility]   [facility]   ZOX:[id number] DOB: 1986-04-20  DOA: 02/21/2018 PCP: Patient, No Pcp Per   Brief Narrative:  Lynnda Childavid Anthony Clanton 32 year old male with past medical history of hep C +, Tourette's syndromes and polysubstance abuse who was brought by EMS after [employer] found patient on the third floor of hotel threatening to jump.  Patient was agitated and acting erratically and required 6 people to restrain him.  Upon ED evaluation patient was given multiple doses of Geodon and Ativan, however continued to be agitated.  UDS was positive for amphetamines, opiates and cocaine.  Also was noted to have right finger cellulitis and abscess.  Patient was admitted with working diagnosis of agitation/delirium from possible drug intoxication and cellulitis of right index finger.  Patient subsequently continued to be agitated PCCM was consulted and started on Precedex.  Patient was sent to the ICU for further evaluation.  Hospital course notable for AKI and rhabdomyolysis.  He was weaned off Precedex on 9/18 and [facility]  Subjective: Patient is seen and examined, he is awake and alert.  He has no complaints this morning.  Continues to be in restraints.  Denies abdominal pain, nausea and vomiting.  No diaphoresis or tremors.  Assessment & Plan: Polysubstance abuse with agitated delirium Concern of withdrawals, patient positive for cocaine, amphetamines and opiates Initially was treated with Precedex, successfully wean off.  Currently on scheduled Haldol and as needed for agitation.  Patient has been IVC due to suicidal threat.  Will discontinue scheduled Haldol.  Continue Haldol as needed.  Will add low-dose Seroquel.  Will have psych evaluate patient.  Okay to transfer to telemetry floor to monitor QTC. Get EKG daily.   AKI Felt to be multifactorial from rhabdo and volume depletion. Creatinine improved after IV fluid Okay to discontinue IV  fluids, encourage oral hydration. Avoid nephrotoxic agents and monitor renal function in a.m.  Mild rhabdomyolysis From severe agitation CK trending down.  Encourage oral hydration.  Right index finger cellulitis/abscess Status post debridement by Dr. Janee Mornhompson On vancomycin, continue management per hand surgery.  Elevated LFTs In setting of intoxication, LFTs trending down, continue to monitor.  DVT prophylaxis: Heparin Code Status: Full code Family Communication: None at bedside Disposition Plan: Transfer to telemetry monitor, if continues to be stable and psych clear may DC home in the next 1 to 2 days.  Consultants:   PCCM  Psychiatry  Hand surgery  Procedures:   Debridement of right index finger  Antimicrobials:  Vancomycin 9/17 ->   Objective: Vitals:   02/24/18 0200 02/24/18 0330 02/24/18 0600 02/24/18 0700  BP: 122/63  119/60 122/61  Pulse:      Resp: (!) 21  (!) 23 (!) 21  Temp:  98.9 F (37.2 C)    TempSrc:  Oral    SpO2: 99%       Intake/Output Summary (Last 24 hours) at 02/24/2018 0833 Last data filed at 02/24/2018 0600 Gross per 24 hour  Intake 3950.48 ml  Output 1100 ml  Net 2850.48 ml   There were no vitals filed for this visit.  Examination:  General exam: Appears calm and comfortable  Respiratory system: Clear to auscultation. No wheezes,crackle or rhonchi Cardiovascular system: S1 & S2 heard, RRR. No JVD, murmurs, rubs or gallops Gastrointestinal system: Abdomen is nondistended, soft and nontender.  Central nervous system: Alert and oriented. No focal neurological deficits. Extremities: Right index finger with dressing clean and intact Skin: No  rashes, lesions or ulcers Psychiatry: Judgement and insight appear normal. Mood & affect appropriate.    Data Reviewed: I have personally reviewed following labs and imaging studies  CBC: Recent Labs  Lab 02/21/18 2009 02/23/18 0741 02/24/18 0337  WBC 34.3* 4.7 4.4  NEUTROABS 30.0  --    --   HGB 13.0 9.7* 9.7*  HCT 38.9* 29.8* 30.0*  MCV 86.8 89.0 89.0  PLT 605* 229 244   Basic Metabolic Panel: Recent Labs  Lab 02/21/18 2048 02/22/18 0500 02/23/18 0610 02/23/18 0741 02/24/18 0337  NA 148* 147* QUESTIONABLE RESULTS, RECOMMEND RECOLLECT TO VERIFY 144 144  K 3.6 4.0 QUESTIONABLE RESULTS, RECOMMEND RECOLLECT TO VERIFY 4.0 3.4*  CL 107 113* QUESTIONABLE RESULTS, RECOMMEND RECOLLECT TO VERIFY 113* 113*  CO2 24 23 QUESTIONABLE RESULTS, RECOMMEND RECOLLECT TO VERIFY 23 24  GLUCOSE 83 91 QUESTIONABLE RESULTS, RECOMMEND RECOLLECT TO VERIFY 105* 100*  BUN 26* 35* QUESTIONABLE RESULTS, RECOMMEND RECOLLECT TO VERIFY 28* 12  CREATININE 2.06* 1.61* QUESTIONABLE RESULTS, RECOMMEND RECOLLECT TO VERIFY 1.05 0.67  CALCIUM 10.0 8.6* QUESTIONABLE RESULTS, RECOMMEND RECOLLECT TO VERIFY 8.4* 8.3*  MG  --   --  QUESTIONABLE RESULTS, RECOMMEND RECOLLECT TO VERIFY 2.2  --    GFR: Estimated Creatinine Clearance: 151.2 mL/min (by C-G formula based on SCr of 0.67 mg/dL). Liver Function Tests: Recent Labs  Lab 02/21/18 2048 02/23/18 0610 02/23/18 0741 02/24/18 0337  AST 67* QUESTIONABLE RESULTS, RECOMMEND RECOLLECT TO VERIFY 272* 142*  ALT 41 QUESTIONABLE RESULTS, RECOMMEND RECOLLECT TO VERIFY 226* 183*  ALKPHOS 159* QUESTIONABLE RESULTS, RECOMMEND RECOLLECT TO VERIFY 105 85  BILITOT 1.4* QUESTIONABLE RESULTS, RECOMMEND RECOLLECT TO VERIFY 0.9 0.8  PROT 9.5* QUESTIONABLE RESULTS, RECOMMEND RECOLLECT TO VERIFY 6.9 6.5  ALBUMIN 4.5 QUESTIONABLE RESULTS, RECOMMEND RECOLLECT TO VERIFY 3.0* 2.9*   No results for input(s): LIPASE, AMYLASE in the last 168 hours. No results for input(s): AMMONIA in the last 168 hours. Coagulation Profile: No results for input(s): INR, PROTIME in the last 168 hours. Cardiac Enzymes: Recent Labs  Lab 02/22/18 1527 02/23/18 0610 02/23/18 0741  CKTOTAL 2,006* QUESTIONABLE RESULTS, RECOMMEND RECOLLECT TO VERIFY 868*   BNP (last 3 results) No results for  input(s): PROBNP in the last 8760 hours. HbA1C: No results for input(s): HGBA1C in the last 72 hours. CBG: Recent Labs  Lab 02/21/18 1825 02/21/18 2148  GLUCAP 61* 82   Lipid Profile: No results for input(s): CHOL, HDL, LDLCALC, TRIG, CHOLHDL, LDLDIRECT in the last 72 hours. Thyroid Function Tests: No results for input(s): TSH, T4TOTAL, FREET4, T3FREE, THYROIDAB in the last 72 hours. Anemia Panel: No results for input(s): VITAMINB12, FOLATE, FERRITIN, TIBC, IRON, RETICCTPCT in the last 72 hours. Sepsis Labs: No results for input(s): PROCALCITON, LATICACIDVEN in the last 168 hours.  Recent Results (from the past 240 hour(s))  Blood Culture (routine x 2)     Status: None   Collection Time: 02/14/18  1:20 PM  Result Value Ref Range Status   Specimen Description BLOOD LEFT ANTECUBITAL  Final   Special Requests   Final    BOTTLES DRAWN AEROBIC AND ANAEROBIC Blood Culture adequate volume   Culture   Final    NO GROWTH 5 DAYS Performed at [facility], [address], [place], [place] [address]    Report Status 02/19/2018 FINAL  Final  Blood Culture (routine x 2)     Status: None   Collection Time: 02/14/18  1:27 PM  Result Value Ref Range Status   Specimen Description BLOOD LEFT  FOREARM  Final   Special Requests   Final    BOTTLES DRAWN AEROBIC AND ANAEROBIC Blood Culture adequate volume   Culture   Final    NO GROWTH 5 DAYS Performed at [facility], [address] [place], [place] [address]    Report Status 02/19/2018 FINAL  Final  Culture, blood (single) w Reflex to ID Panel     Status: None   Collection Time: 02/14/18  5:25 PM  Result Value Ref Range Status   Specimen Description BLOOD RIGHT FOREARM  Final   Special Requests   Final    BOTTLES DRAWN AEROBIC AND ANAEROBIC Blood Culture results may not be optimal due to an inadequate volume of blood received in culture bottles   Culture   Final    NO GROWTH 5 DAYS Performed at [facility], [address], [place], [place] [address]    Report Status 02/19/2018 FINAL  Final  Aerobic/Anaerobic Culture (surgical/deep wound)     Status: None   Collection Time: 02/14/18  9:44 PM  Result Value Ref Range Status   Specimen Description ABSCESS RIGHT FINGER  Final   Special Requests PT ON ANCEF  Final   Gram Stain   Final    MODERATE WBC PRESENT, PREDOMINANTLY PMN FEW GRAM POSITIVE COCCI    Culture   Final    MODERATE METHICILLIN RESISTANT STAPHYLOCOCCUS AUREUS NO ANAEROBES ISOLATED Performed at [facility], [address] [place], [place] [address]    Report Status 02/19/2018 FINAL  Final   Organism ID, Bacteria METHICILLIN RESISTANT STAPHYLOCOCCUS AUREUS  Final      Susceptibility   Methicillin resistant staphylococcus aureus - MIC*    CIPROFLOXACIN <=0.5 SENSITIVE Sensitive     ERYTHROMYCIN >=8 RESISTANT Resistant     GENTAMICIN <=0.5 SENSITIVE Sensitive     OXACILLIN >=4 RESISTANT Resistant     TETRACYCLINE <=1 SENSITIVE Sensitive     VANCOMYCIN <=0.5 SENSITIVE Sensitive     TRIMETH/SULFA <=10 SENSITIVE Sensitive     CLINDAMYCIN >=8 RESISTANT Resistant     RIFAMPIN <=0.5 SENSITIVE Sensitive     Inducible Clindamycin NEGATIVE Sensitive     * MODERATE METHICILLIN RESISTANT STAPHYLOCOCCUS AUREUS  Surgical pcr screen     Status: Abnormal   Collection Time: 02/16/18  3:46 AM  Result Value Ref Range Status   MRSA, PCR POSITIVE (A) NEGATIVE Final    Comment: RESULT CALLED TO, READ BACK BY AND VERIFIED WITHForestine Na RN [phone] 02/16/18 A BROWNING    Staphylococcus aureus POSITIVE (A) NEGATIVE Final    Comment: (NOTE) The Xpert SA Assay (FDA approved for NASAL specimens in patients 75 years of age and older), is one component of a comprehensive surveillance program. It is not intended to diagnose infection nor to guide or monitor treatment. Performed at [facility], [address] [place], [place] [address]   Culture, blood (routine x 2)     Status: None  (Preliminary result)   Collection Time: 02/21/18  8:23 PM  Result Value Ref Range Status   Specimen Description   Final    BLOOD RIGHT ANTECUBITAL Performed at [facility], [address] [place], [place] [address]    Special Requests   Final    BOTTLES DRAWN AEROBIC AND ANAEROBIC Blood Culture adequate volume Performed at [facility], [address] [place], [place] [address]    Culture   Final    NO GROWTH 3 DAYS  Performed at [facility], [address] [place] [address], [place], [place] [address]    Report Status PENDING  Incomplete  Culture, blood (routine x 2)     Status: None (Preliminary result)   Collection Time: 02/22/18 12:34 PM  Result Value Ref Range Status   Specimen Description   Final    BLOOD BLOOD LEFT ARM Performed at [facility], [address], [place], [place] [address]    Special Requests   Final    BOTTLES DRAWN AEROBIC AND ANAEROBIC Blood Culture adequate volume Performed at [facility], [address] [place], [place] [address]    Culture   Final    NO GROWTH 2 DAYS Performed at [facility], [address], [place], [place] [address]    Report Status PENDING  Incomplete  MRSA PCR Screening     Status: Abnormal   Collection Time: 02/22/18  6:16 PM  Result Value Ref Range Status   MRSA by PCR POSITIVE (A) NEGATIVE Final    Comment:        The GeneXpert MRSA Assay (FDA approved for NASAL specimens only), is one component of a comprehensive MRSA colonization surveillance program. It is not intended to diagnose MRSA infection nor to guide or monitor treatment for MRSA infections. RESULT CALLED TO, READ BACK BY AND VERIFIED WITH: Blake Divine RN [id number] 02/23/18 A NAVARRO Performed at [facility], [address] [place], [place] [address]       Radiology Studies: No results found.    Scheduled Meds: . bacitracin   Topical BID  . Chlorhexidine  Gluconate Cloth  6 each Topical Q0600  . haloperidol lactate  2 mg Intravenous Q8H  . heparin injection (subcutaneous)  5,000 Units Subcutaneous Q8H  . mouth rinse  15 mL Mouth Rinse BID  . mupirocin ointment  1 application Nasal BID  . potassium chloride  20 mEq Oral Q4H   Continuous Infusions: . sodium chloride 125 mL/hr at 02/24/18 0600  . vancomycin Stopped (02/23/18 2206)     LOS: 2 days    Time spent: Total of 35 minutes spent with pt, greater than 50% of which was spent in discussion of  treatment, counseling and coordination of care   Latrelle Dodrill, MD Pager: Text Page via [email or web address]   If 7PM-7AM, please contact night-coverage [email or web address] 02/24/2018, 8:33 AM   Note - This record has been created using [employer]. Chart creation errors have been sought, but may not always have been located. Such creation errors do not reflect on the standard of medical care.

## 2018-02-24 NOTE — Progress Notes (Signed)
[facility] FOR AM LAB REPLACEMENT ONLY  The patient does apply for the [facility]lyte [facility] Protocol based on the criteria listed below:   1. Is GFR >/= 40 ml/min? Yes.    Patient's GFR today is >60 2. Is urine output >/= 0.5 ml/kg/hr for the last 6 hours? Yes.   Patient's UOP is 2 ml/kg/hr 3. Is BUN < 60 mg/dL? Yes.    Patient's BUN today is 12 4. Abnormal electrolyte(s): K-3.4 5. Ordered repletion with: per protocol 6. If a panic level lab has been reported, has the CCM MD in charge been notified? Yes.  .   Physician:  Dr. Roxy Mannseterding  Orlene Salmons, Dixon BoosMaria Samson 02/24/2018 5:02 AM

## 2018-02-24 NOTE — Progress Notes (Signed)
Pharmacy Antibiotic Note  Hunter [name] is a 32 y.o. male admitted on 02/21/2018 with polysubstance abuse, delirium, and cellulitis.  PMH includes recent admission for deep wound on right index finger s/p I&D with +MRSA culture, discharged on PO Bactrim.  Unclear if he was taking Bactrim PTA, but plans for re-exploration, suture removal, and I&D of finger on 9/17.  Pharmacy has been consulted for Vancomycin dosing.  Plan:  Increase to Vancomycin 2000 mg IV q12h.  Check vancomycin levels if remains on vancomycin > 3-4 days.  Goal AUC 400-500.  Follow up renal fxn, culture results, and clinical course.     Temp (24hrs), Avg:98.9 F (37.2 C), Min:97.8 F (36.6 C), Max:100.2 F (37.9 C)  Recent Labs  Lab 02/21/18 2009 02/21/18 2048 02/22/18 0500 02/23/18 0610 02/23/18 0741 02/24/18 0337  WBC 34.3*  --   --   --  4.7 4.4  CREATININE  --  2.06* 1.61* QUESTIONABLE RESULTS, RECOMMEND RECOLLECT TO VERIFY 1.05 0.67    Estimated Creatinine Clearance: 151.2 mL/min (by C-G formula based on SCr of 0.67 mg/dL).    No Known Allergies  Antimicrobials this admission: 9/17 Vancomycin >>  Dose adjustments this admission: 9/18 SCr decreasing, vanc dose re-calculated and increased from 1250mg  IV q24h to 1250mg  IV q12h. 9/19 SCr decreased to near baseline ~ 0.65, vanc dose re-calculated and increased from 1250 mg IV q12h to 2000 mg IV q12h.  Microbiology results:  9/16 BCx: ngtd 9/17 MRSA PCR: positive  Thank you for allowing pharmacy to be a part of this patient's care.  Lynann Beaverhristine Leodan Bolyard PharmD, BCPS Pager [phone] 02/24/2018 9:04 AM

## 2018-02-25 DIAGNOSIS — F1910 Other psychoactive substance abuse, uncomplicated: Secondary | ICD-10-CM

## 2018-02-25 LAB — CBC
HCT: 29.6 % — ABNORMAL LOW (ref 39.0–52.0)
HEMOGLOBIN: 9.7 g/dL — AB (ref 13.0–17.0)
MCH: 28.9 pg (ref 26.0–34.0)
MCHC: 32.8 g/dL (ref 30.0–36.0)
MCV: 88.1 fL (ref 78.0–100.0)
Platelets: 238 10*3/uL (ref 150–400)
RBC: 3.36 MIL/uL — AB (ref 4.22–5.81)
RDW: 14.7 % (ref 11.5–15.5)
WBC: 4.2 10*3/uL (ref 4.0–10.5)

## 2018-02-25 LAB — COMPREHENSIVE METABOLIC PANEL
ALK PHOS: 92 U/L (ref 38–126)
ALT: 125 U/L — AB (ref 0–44)
AST: 73 U/L — ABNORMAL HIGH (ref 15–41)
Albumin: 3 g/dL — ABNORMAL LOW (ref 3.5–5.0)
Anion gap: 7 (ref 5–15)
BUN: 6 mg/dL (ref 6–20)
CO2: 24 mmol/L (ref 22–32)
CREATININE: 0.69 mg/dL (ref 0.61–1.24)
Calcium: 8.7 mg/dL — ABNORMAL LOW (ref 8.9–10.3)
Chloride: 114 mmol/L — ABNORMAL HIGH (ref 98–111)
Glucose, Bld: 107 mg/dL — ABNORMAL HIGH (ref 70–99)
Potassium: 3.4 mmol/L — ABNORMAL LOW (ref 3.5–5.1)
Sodium: 145 mmol/L (ref 135–145)
Total Bilirubin: 0.7 mg/dL (ref 0.3–1.2)
Total Protein: 6.4 g/dL — ABNORMAL LOW (ref 6.5–8.1)

## 2018-02-25 LAB — MAGNESIUM: MAGNESIUM: 1.5 mg/dL — AB (ref 1.7–2.4)

## 2018-02-25 MED ORDER — DOXYCYCLINE HYCLATE 50 MG PO CAPS: 100.0000 mg | ORAL_CAPSULE | Freq: Two times a day (BID) | ORAL | 0 refills | Status: AC

## 2018-02-25 MED ORDER — QUETIAPINE FUMARATE 25 MG PO TABS: 25.0000 mg | ORAL_TABLET | Freq: Two times a day (BID) | ORAL | 0 refills | Status: AC

## 2018-02-25 MED ORDER — POTASSIUM CHLORIDE CRYS ER 20 MEQ PO TBCR
40.0000 meq | EXTENDED_RELEASE_TABLET | Freq: Once | ORAL | Status: AC
  Administered 2018-02-25: 40 meq via ORAL
  Filled 2018-02-25: qty 2

## 2018-02-25 MED ORDER — MAGNESIUM OXIDE 400 (241.3 MG) MG PO TABS
800.0000 mg | ORAL_TABLET | Freq: Once | ORAL | Status: AC
  Administered 2018-02-25: 800 mg via ORAL
  Filled 2018-02-25: qty 2

## 2018-02-25 MED ORDER — NAPROXEN 500 MG PO TABS: 500.0000 mg | ORAL_TABLET | Freq: Two times a day (BID) | ORAL | 0 refills | Status: AC

## 2018-02-25 NOTE — Progress Notes (Signed)
Pt presenting with withdrawal symptom, pt c/o of generalized aches and pain, N/D, abdominal cramping, watery eyes, runny nose, goose bumps, sweating, tremors, and is anxious. PRNs given at this time.

## 2018-02-25 NOTE — Discharge Summary (Signed)
Physician Discharge Summary  Chet Greenley [facility]  ZOX:[id number]  DOB: 1986/03/08  DOA: 02/21/2018 PCP: Patient, No Pcp Per  Admit date: 02/21/2018 Discharge date: 02/25/2018  Admitted From: Home  Disposition: Home   Recommendations for Outpatient Follow-up:  1. Follow up with PCP in 1 week  2. Will benefit from substance abuse treatment, patient declining  3. Psych referral as outpatient  4. Please obtain CMP to monitor renal and liver functions in 1 to 2 weeks. 5. Complete abx therapy  6. Follow up with hand surgery in 2 weeks for suture removal   Discharge Condition: Stable   CODE STATUS: Full Code  Diet recommendation:  Regular  Brief/Interim Summary: For full details see H&P/Progress note, but in brief, Hunter [name] is a 32 year old male with past medical history of hep C +, Tourette's syndromes and polysubstance abuse who was brought by EMS after [employer] found patient on the third floor of hotel threatening to jump.  Patient was agitated and acting erratically and required 6 people to restrain him.  Upon ED evaluation patient was given multiple doses of Geodon and Ativan, however continued to be agitated.  UDS was positive for amphetamines, opiates and cocaine.  Also was noted to have right finger cellulitis and abscess.  Patient was admitted with working diagnosis of agitation/delirium from possible drug intoxication and cellulitis of right index finger.  Patient subsequently continued to be agitated PCCM was consulted and started on Precedex.  Patient was sent to the ICU for further evaluation.  Hospital course notable for AKI and rhabdomyolysis.  He was weaned off Precedex on 9/18 and transferred to [facility] for further care. Psych was consulted and patient was deemed psychiatrically stable and no need for psych inpatient admission.   Subjective: Patient seen and examined, no complaints this morning.  Again discussed substance abuse treatment but patient declined.  Patient wants to go  home.  Denies chest pain, shortness of breath and palpitation.  Discharge Diagnoses/Hospital Course:  Polysubstance abuse with agitated delirium Concern of intoxication vs withdrawals, patient positive for cocaine, amphetamines and opiates Initially was treated with Precedex, successfully wean off.    Subsequently placed on IV Haldol as needed for agitation.  Transitioned to Seroquel and feeling well.  Psych was consulted and deemed patient stable psychiatrically.  Recommend outpatient rehab for drug abuse  AKI - resolved Felt to be multifactorial from rhabdo and volume depletion. Creatinine improved after IV fluid Encourage oral hydration and check BMP in 1 week  Mild rhabdomyolysis Resolved  Right index finger cellulitis/abscess Status post debridement by Dr. Janee Morn She was treated with IV vancomycin, prior cultures positive for MRSA sensitive to tetracyclines.  Will discharge patient on doxycycline to complete 10 days of therapy.  Advised to schedule an appointment with Dr. Janee Morn in 2 weeks for follow-up and suture removal   Elevated LFTs In setting of intoxication, LFTs trending down, check liver function in 1 to 2 weeks.  On the day of the discharge the patient's vitals were stable, and no other acute medical condition were reported by patient. the patient was felt safe to be discharge to home.   Discharge Instructions  You were cared for by a hospitalist during your hospital stay. If you have any questions about your discharge medications or the care you received while you were in the hospital after you are discharged, you can call the unit and asked to speak with the hospitalist on call if the hospitalist that took care of you is not available.  Once you are discharged, your primary care physician will handle any further medical issues. Please note that NO REFILLS for any discharge medications will be authorized once you are discharged, as it is imperative that you return to  your primary care physician (or establish a relationship with a primary care physician if you do not have one) for your aftercare needs so that they can reassess your need for medications and monitor your lab values.  Discharge Instructions    Call MD for:  difficulty breathing, headache or visual disturbances   Complete by:  As directed    Call MD for:  extreme fatigue   Complete by:  As directed    Call MD for:  hives   Complete by:  As directed    Call MD for:  persistant dizziness or light-headedness   Complete by:  As directed    Call MD for:  persistant nausea and vomiting   Complete by:  As directed    Call MD for:  redness, tenderness, or signs of infection (pain, swelling, redness, odor or green/yellow discharge around incision site)   Complete by:  As directed    Call MD for:  severe uncontrolled pain   Complete by:  As directed    Call MD for:  temperature >100.4   Complete by:  As directed    Diet - low sodium heart healthy   Complete by:  As directed    Increase activity slowly   Complete by:  As directed      Allergies as of 02/25/2018   No Known Allergies     Medication List    STOP taking these medications   sulfamethoxazole-trimethoprim 800-160 MG tablet Commonly known as:  BACTRIM DS,SEPTRA DS     TAKE these medications   acetaminophen 325 MG tablet Commonly known as:  TYLENOL Take 2 tablets (650 mg total) by mouth every 6 (six) hours as needed for mild pain (or Fever >/= 101).   bacitracin ointment Apply topically 2 (two) times daily.   doxycycline 50 MG capsule Commonly known as:  VIBRAMYCIN Take 2 capsules (100 mg total) by mouth 2 (two) times daily for 7 days.   naproxen 500 MG tablet Commonly known as:  NAPROSYN Take 1 tablet (500 mg total) by mouth 2 (two) times daily with a meal.   oxyCODONE 5 MG immediate release tablet Commonly known as:  Oxy IR/ROXICODONE Take 0-1 tablets (0-5 mg total) by mouth every 6 (six) hours as needed for severe  pain (postop pain).   polyethylene glycol packet Commonly known as:  MIRALAX / GLYCOLAX Take 17 g by mouth daily as needed for mild constipation.   QUEtiapine 25 MG tablet Commonly known as:  SEROQUEL Take 1 tablet (25 mg total) by mouth 2 (two) times daily.      Follow-up Information    Schedule an appointment as soon as possible for a visit with Mack Hook, MD.   Specialty:  Orthopedic Surgery Why:  approximately 2 weeks from 02-22-18 , For suture removal Contact information: [address] [place] [address] [phone]        [facility]. Schedule an appointment as soon as possible for a visit in 1 week(s).   Why:  Establish care and hospital follow up  Contact information: [address] [place] [address] [phone]         No Known Allergies  Consultations:  PCCM   Procedures/Studies: Ct Head Wo Contrast  Result Date: 02/21/2018 CLINICAL DATA:  Altered LOC EXAM: CT HEAD WITHOUT CONTRAST TECHNIQUE: Contiguous axial images were obtained from the base of the skull through the vertex without intravenous contrast. COMPARISON:  None. FINDINGS: Brain: No evidence of acute infarction, hemorrhage, hydrocephalus, extra-axial collection or mass lesion/mass effect. Vascular: No hyperdense vessel or unexpected calcification. Skull: Normal. Negative for fracture or focal lesion. Sinuses/Orbits: No acute finding. Other: None IMPRESSION: Negative non contrasted CT appearance of the brain. Electronically Signed   By: Jasmine PangKim  Fujinaga M.D.   On: 02/21/2018 21:12   Dg Hand 2 View Right  Result Date: 02/14/2018 CLINICAL DATA:  Pain and swelling of index finger after injury with a thorn. EXAM: RIGHT HAND - 2 VIEW COMPARISON:  No comparison studies available. FINDINGS: Two-view exam shows diffuse soft tissue swelling in the index finger. No underlying bony abnormality. No evidence for radiopaque soft tissue foreign body.  IMPRESSION: Diffuse soft tissue swelling in the index finger without underlying acute bony findings or radiodense soft tissue foreign body. Electronically Signed   By: Kennith CenterEric  Mansell M.D.   On: 02/14/2018 16:30   Dg Hand Complete Right  Result Date: 02/21/2018 CLINICAL DATA:  32 year old male with large 2nd finger laceration. EXAM: RIGHT HAND - COMPLETE 3+ VIEW COMPARISON:  Right hand series 02/14/2018. FINDINGS: New soft tissue wound along the radial aspect of the right 2nd finger where previously moderate to severe generalized finger soft tissue swelling was noted. No radiopaque foreign body identified. Underlying osseous structures appear stable. Chronic, healed 5th metacarpal fracture. Otherwise no fracture, dislocation, or arthropathic changes identified about the right hand. IMPRESSION: 1. Soft tissue wound along the radial aspect of the right index finger. No radiopaque foreign body identified. 2. No acute osseous abnormality identified. Healed chronic 5th metacarpal fracture. Electronically Signed   By: Odessa FlemingH  Hall M.D.   On: 02/21/2018 21:00    Discharge Exam: Vitals:   02/25/18 0436 02/25/18 0636  BP: 133/83 122/73  Pulse: 69 61  Resp: 16   Temp: 98.2 F (36.8 C) 98.9 F (37.2 C)  SpO2: 100%    Vitals:   02/24/18 1112 02/24/18 2041 02/25/18 0436 02/25/18 0636  BP: 139/87 135/85 133/83 122/73  Pulse: 74 66 69 61  Resp: (!) 24 20 16    Temp:   98.2 F (36.8 C) 98.9 F (37.2 C)  TempSrc:   Oral Oral  SpO2: 100% 100% 100%   Weight:      Height:        General: Pt is alert, awake, not in acute distress Cardiovascular: RRR, S1/S2 +, no rubs, no gallops Respiratory: CTA bilaterally, no wheezing, no rhonchi Abdominal: Soft, NT, ND, bowel sounds + Extremities: no edema   The results of significant diagnostics from this hospitalization (including imaging, microbiology, ancillary and laboratory) are listed below for reference.     Microbiology: Recent Results (from the past 240  hour(s))  Surgical pcr screen     Status: Abnormal   Collection Time: 02/16/18  3:46 AM  Result Value Ref Range Status   MRSA, PCR POSITIVE (A) NEGATIVE Final    Comment: RESULT CALLED TO, READ BACK BY AND VERIFIED WITHForestine Na: R SNEIDERMAN RN [phone] 02/16/18 A BROWNING    Staphylococcus aureus POSITIVE (A) NEGATIVE Final    Comment: (NOTE) The Xpert SA Assay (FDA approved for NASAL specimens in patients 32 years of age and older), is one component of a comprehensive surveillance program. It is not intended to diagnose infection nor to guide or monitor treatment.  Performed at [facility], [address] [place], [place] [address]   Culture, blood (routine x 2)     Status: None (Preliminary result)   Collection Time: 02/21/18  8:23 PM  Result Value Ref Range Status   Specimen Description   Final    BLOOD RIGHT ANTECUBITAL Performed at [facility], [address], [place], [place] [address]    Special Requests   Final    BOTTLES DRAWN AEROBIC AND ANAEROBIC Blood Culture adequate volume Performed at [facility], [address], [place], [place] [address]    Culture   Final    NO GROWTH 4 DAYS Performed at [facility], [address] [place], [place] [address]    Report Status PENDING  Incomplete  Culture, blood (routine x 2)     Status: None (Preliminary result)   Collection Time: 02/22/18 12:34 PM  Result Value Ref Range Status   Specimen Description   Final    BLOOD BLOOD LEFT ARM Performed at [facility], [address] [place], [place] [address]    Special Requests   Final    BOTTLES DRAWN AEROBIC AND ANAEROBIC Blood Culture adequate volume Performed at [facility], [address], [place], [place] [address]    Culture   Final    NO GROWTH 3 DAYS Performed at [facility], [address], [place], [place] [address]    Report Status PENDING  Incomplete  MRSA PCR Screening      Status: Abnormal   Collection Time: 02/22/18  6:16 PM  Result Value Ref Range Status   MRSA by PCR POSITIVE (A) NEGATIVE Final    Comment:        The GeneXpert MRSA Assay (FDA approved for NASAL specimens only), is one component of a comprehensive MRSA colonization surveillance program. It is not intended to diagnose MRSA infection nor to guide or monitor treatment for MRSA infections. RESULT CALLED TO, READ BACK BY AND VERIFIED WITH: Blake Divine RN [id number] 02/23/18 A NAVARRO Performed at [facility], [address] [place], [place] [address]      Labs: BNP (last 3 results) No results for input(s): BNP in the last 8760 hours. Basic Metabolic Panel: Recent Labs  Lab 02/22/18 0500 02/23/18 0610 02/23/18 0741 02/24/18 0337 02/25/18 0426  NA 147* QUESTIONABLE RESULTS, RECOMMEND RECOLLECT TO VERIFY 144 144 145  K 4.0 QUESTIONABLE RESULTS, RECOMMEND RECOLLECT TO VERIFY 4.0 3.4* 3.4*  CL 113* QUESTIONABLE RESULTS, RECOMMEND RECOLLECT TO VERIFY 113* 113* 114*  CO2 23 QUESTIONABLE RESULTS, RECOMMEND RECOLLECT TO VERIFY 23 24 24   GLUCOSE 91 QUESTIONABLE RESULTS, RECOMMEND RECOLLECT TO VERIFY 105* 100* 107*  BUN 35* QUESTIONABLE RESULTS, RECOMMEND RECOLLECT TO VERIFY 28* 12 6  CREATININE 1.61* QUESTIONABLE RESULTS, RECOMMEND RECOLLECT TO VERIFY 1.05 0.67 0.69  CALCIUM 8.6* QUESTIONABLE RESULTS, RECOMMEND RECOLLECT TO VERIFY 8.4* 8.3* 8.7*  MG  --  QUESTIONABLE RESULTS, RECOMMEND RECOLLECT TO VERIFY 2.2  --  1.5*   Liver Function Tests: Recent Labs  Lab 02/21/18 2048 02/23/18 0610 02/23/18 0741 02/24/18 0337 02/25/18 0426  AST 67* QUESTIONABLE RESULTS, RECOMMEND RECOLLECT TO VERIFY 272* 142* 73*  ALT 41 QUESTIONABLE RESULTS, RECOMMEND RECOLLECT TO VERIFY 226* 183* 125*  ALKPHOS 159* QUESTIONABLE RESULTS, RECOMMEND RECOLLECT TO VERIFY 105 85 92  BILITOT 1.4* QUESTIONABLE RESULTS, RECOMMEND RECOLLECT TO VERIFY 0.9 0.8 0.7  PROT 9.5* QUESTIONABLE RESULTS, RECOMMEND  RECOLLECT TO VERIFY 6.9 6.5 6.4*  ALBUMIN  4.5 QUESTIONABLE RESULTS, RECOMMEND RECOLLECT TO VERIFY 3.0* 2.9* 3.0*   No results for input(s): LIPASE, AMYLASE in the last 168 hours. No results for input(s): AMMONIA in the last 168 hours. CBC: Recent Labs  Lab 02/21/18 2009 02/23/18 0741 02/24/18 0337 02/25/18 0426  WBC 34.3* 4.7 4.4 4.2  NEUTROABS 30.0  --   --   --   HGB 13.0 9.7* 9.7* 9.7*  HCT 38.9* 29.8* 30.0* 29.6*  MCV 86.8 89.0 89.0 88.1  PLT 605* 229 244 238   Cardiac Enzymes: Recent Labs  Lab 02/22/18 1527 02/23/18 0610 02/23/18 0741  CKTOTAL 2,006* QUESTIONABLE RESULTS, RECOMMEND RECOLLECT TO VERIFY 868*   BNP: Invalid input(s): POCBNP CBG: Recent Labs  Lab 02/21/18 1825 02/21/18 2148  GLUCAP 61* 82   D-Dimer No results for input(s): DDIMER in the last 72 hours. Hgb A1c No results for input(s): HGBA1C in the last 72 hours. Lipid Profile No results for input(s): CHOL, HDL, LDLCALC, TRIG, CHOLHDL, LDLDIRECT in the last 72 hours. Thyroid function studies No results for input(s): TSH, T4TOTAL, T3FREE, THYROIDAB in the last 72 hours.  Invalid input(s): FREET3 Anemia work up No results for input(s): VITAMINB12, FOLATE, FERRITIN, TIBC, IRON, RETICCTPCT in the last 72 hours. Urinalysis    Component Value Date/Time   COLORURINE AMBER (A) 02/22/2018 0027   APPEARANCEUR CLOUDY (A) 02/22/2018 0027   LABSPEC 1.023 02/22/2018 0027   PHURINE 5.0 02/22/2018 0027   GLUCOSEU NEGATIVE 02/22/2018 0027   HGBUR SMALL (A) 02/22/2018 0027   BILIRUBINUR NEGATIVE 02/22/2018 0027   KETONESUR 5 (A) 02/22/2018 0027   PROTEINUR 100 (A) 02/22/2018 0027   NITRITE NEGATIVE 02/22/2018 0027   LEUKOCYTESUR NEGATIVE 02/22/2018 0027   Sepsis Labs Invalid input(s): PROCALCITONIN,  WBC,  LACTICIDVEN Microbiology Recent Results (from the past 240 hour(s))  Surgical pcr screen     Status: Abnormal   Collection Time: 02/16/18  3:46 AM  Result Value Ref Range Status   MRSA, PCR  POSITIVE (A) NEGATIVE Final    Comment: RESULT CALLED TO, READ BACK BY AND VERIFIED WITHForestine Na RN [phone] 02/16/18 A BROWNING    Staphylococcus aureus POSITIVE (A) NEGATIVE Final    Comment: (NOTE) The Xpert SA Assay (FDA approved for NASAL specimens in patients 66 years of age and older), is one component of a comprehensive surveillance program. It is not intended to diagnose infection nor to guide or monitor treatment. Performed at [facility], [address], [place], [place] [address]   Culture, blood (routine x 2)     Status: None (Preliminary result)   Collection Time: 02/21/18  8:23 PM  Result Value Ref Range Status   Specimen Description   Final    BLOOD RIGHT ANTECUBITAL Performed at [facility], [address] [place], [place] [address]    Special Requests   Final    BOTTLES DRAWN AEROBIC AND ANAEROBIC Blood Culture adequate volume Performed at [facility], [address] [place], [place] [address]    Culture   Final    NO GROWTH 4 DAYS Performed at [facility], [address] [place], [place] [address]    Report Status PENDING  Incomplete  Culture, blood (routine x 2)     Status: None (Preliminary result)   Collection Time: 02/22/18 12:34 PM  Result Value Ref Range Status   Specimen Description   Final    BLOOD BLOOD LEFT ARM Performed at [facility], [address]  [place], [place] [address]    Special Requests   Final    BOTTLES DRAWN AEROBIC AND ANAEROBIC Blood Culture adequate volume Performed at [facility], [address], [place], [place] [address]    Culture   Final    NO GROWTH 3 DAYS Performed at [facility], [address], [place], [place] [address]    Report Status PENDING  Incomplete  MRSA PCR Screening     Status: Abnormal   Collection Time: 02/22/18  6:16 PM  Result Value Ref Range Status   MRSA by PCR POSITIVE (A) NEGATIVE Final     Comment:        The GeneXpert MRSA Assay (FDA approved for NASAL specimens only), is one component of a comprehensive MRSA colonization surveillance program. It is not intended to diagnose MRSA infection nor to guide or monitor treatment for MRSA infections. RESULT CALLED TO, READ BACK BY AND VERIFIED WITH: Blake Divine RN [id number] 02/23/18 A NAVARRO Performed at [facility], [address], [place], [place] [address]     Time coordinating discharge: 32 minutes  SIGNED:  Latrelle Dodrill, MD  [facility] 02/25/2018, 11:25 AM  Pager please text page via  [email or web address]  Note - This record has been created using [employer]. Chart creation errors have been sought, but may not always have been located. Such creation errors do not reflect on the standard of medical care.

## 2018-02-26 LAB — CULTURE, BLOOD (ROUTINE X 2)
Culture: NO GROWTH
Special Requests: ADEQUATE

## 2018-02-27 LAB — CULTURE, BLOOD (ROUTINE X 2)
Culture: NO GROWTH
Special Requests: ADEQUATE

## 2019-01-27 IMAGING — CT CT HEAD W/O CM
3 of 4 series · 14 of 47 positions shown, 16 images · non-contrast
Comparison: None.

CLINICAL DATA: Altered LOC

EXAM:
CT HEAD WITHOUT CONTRAST
TECHNIQUE: Contiguous axial images were obtained from the base of the skull
through the vertex without intravenous contrast.

[Series 4: coronal soft tissue · coronal · 0.33mm/px · 3 of 84 slices shown]
[im 28/84  brain]
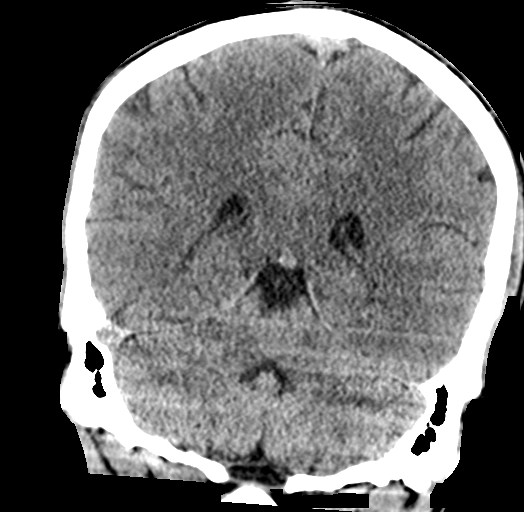
[im 37/84  brain]
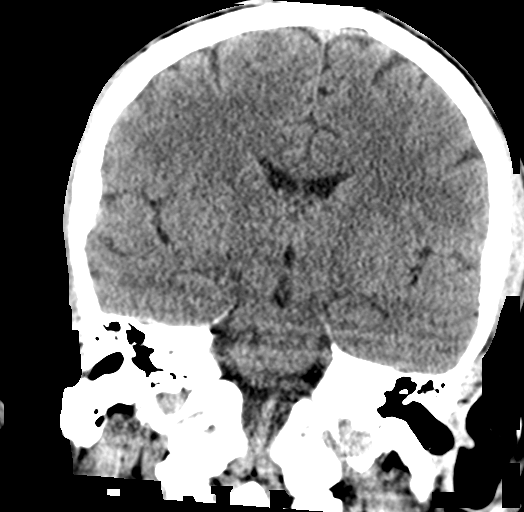
[im 47/84  brain]
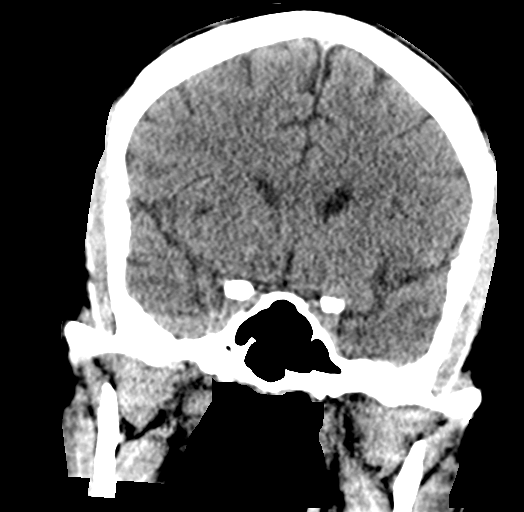

[Series 5: sagittal soft tissue · sagittal · 0.33mm/px · 3 of 53 slices shown]
[im 19/53  brain]
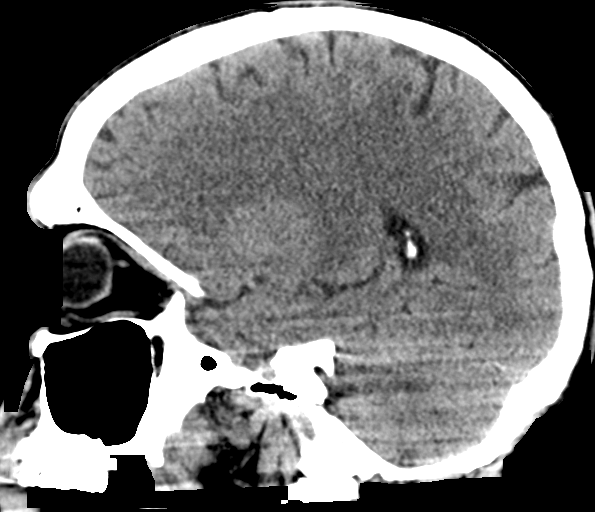
[im 27/53  brain]
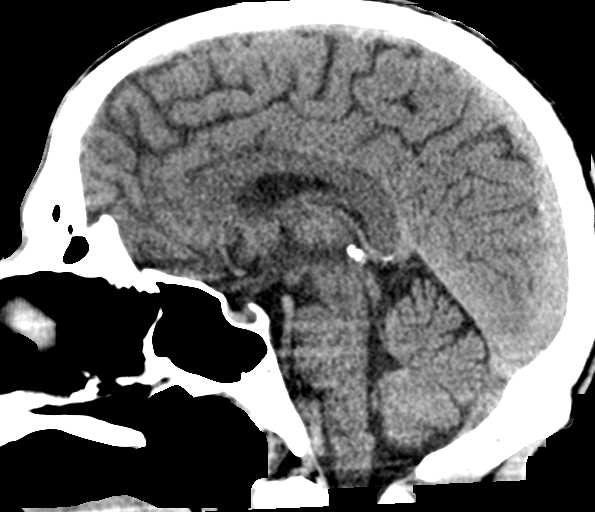
[im 34/53  brain]
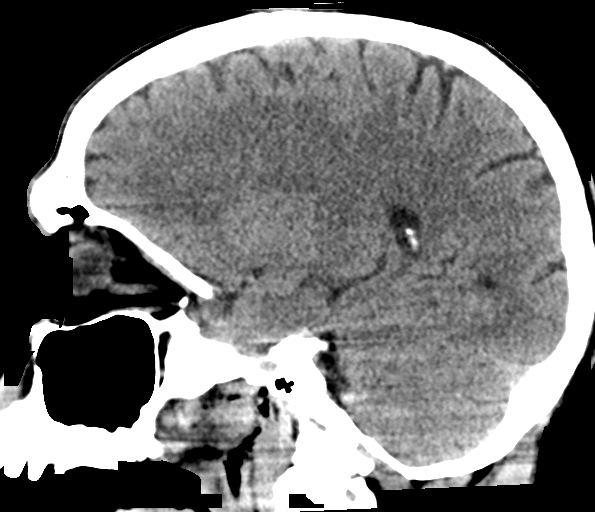

[Series 6: axial recon soft tissue · axial · 0.33mm/px · z∈[+1831,+1955]mm · 8 of 56 slices shown, 10 images]
[im 7/56  brain]
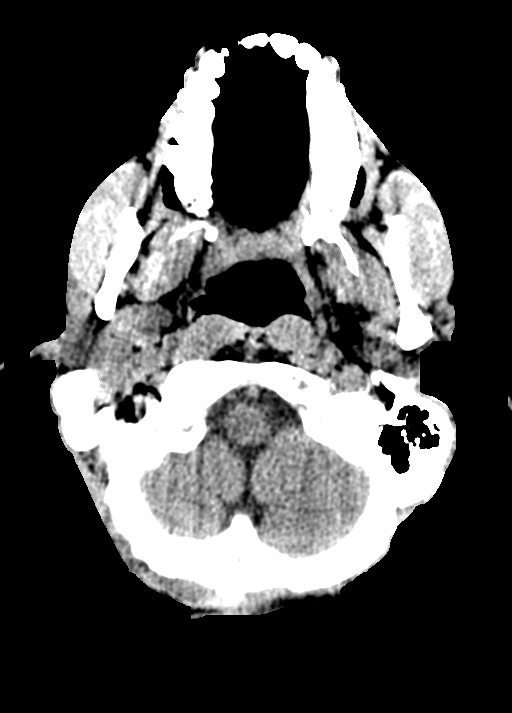
[im 7/56  bone]
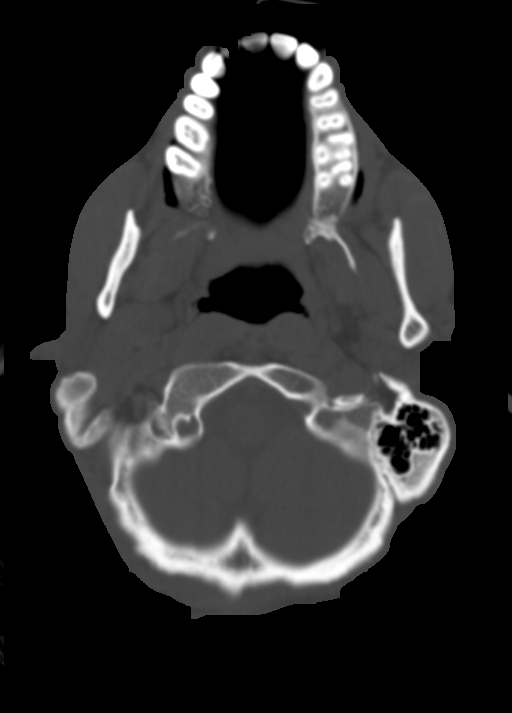
[im 13/56  brain]
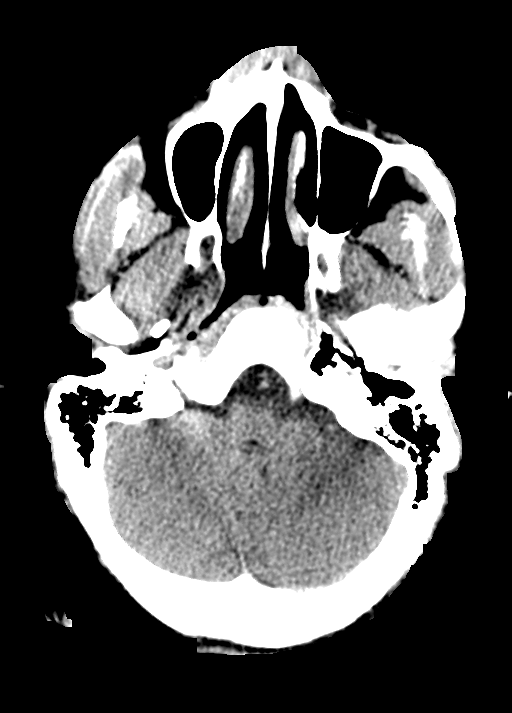
[im 19/56  brain]
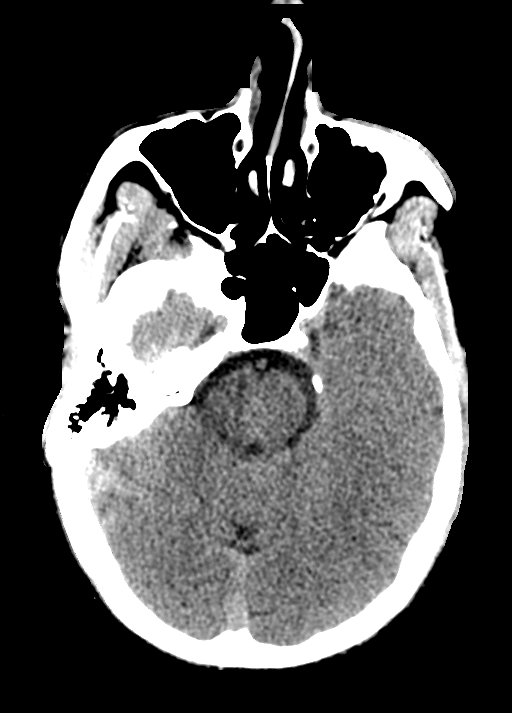
[im 25/56  brain]
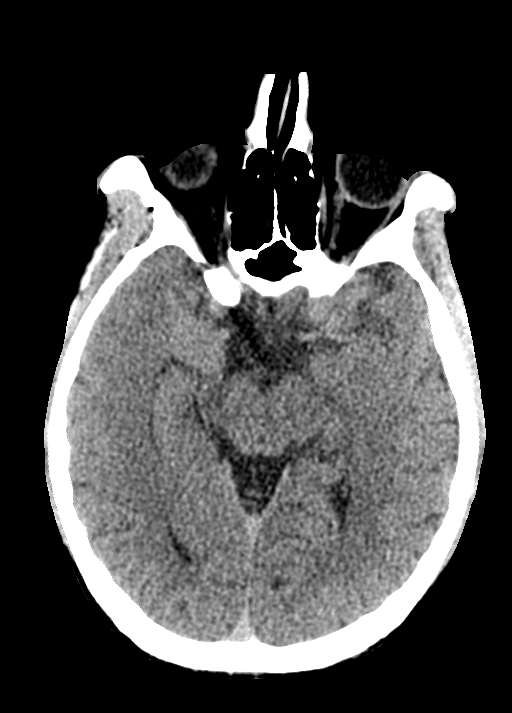
[im 31/56  brain]
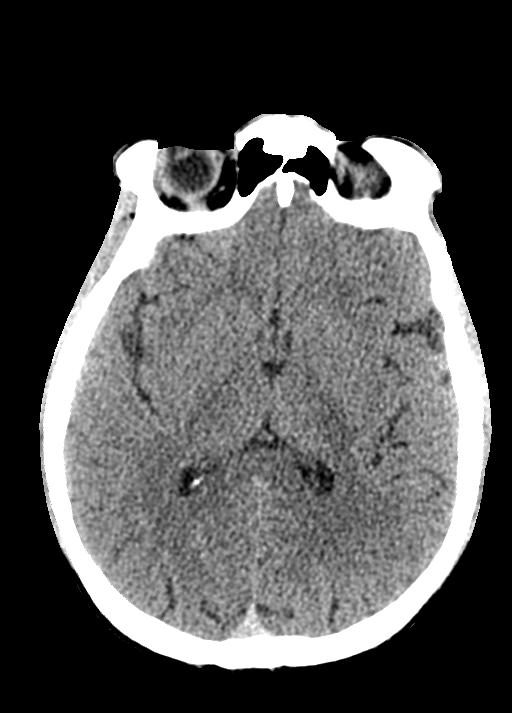
[im 31/56  bone]
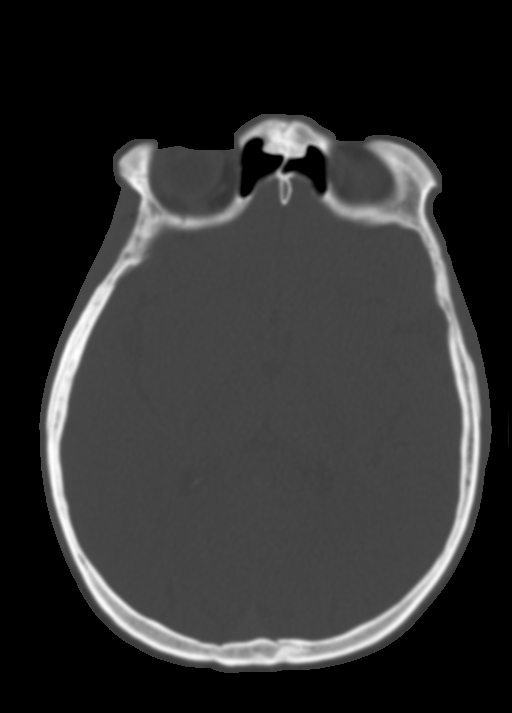
[im 37/56  brain]
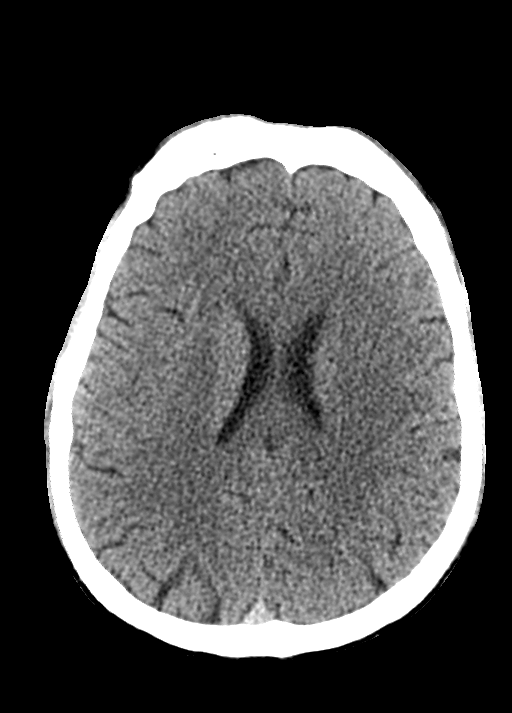
[im 43/56  brain]
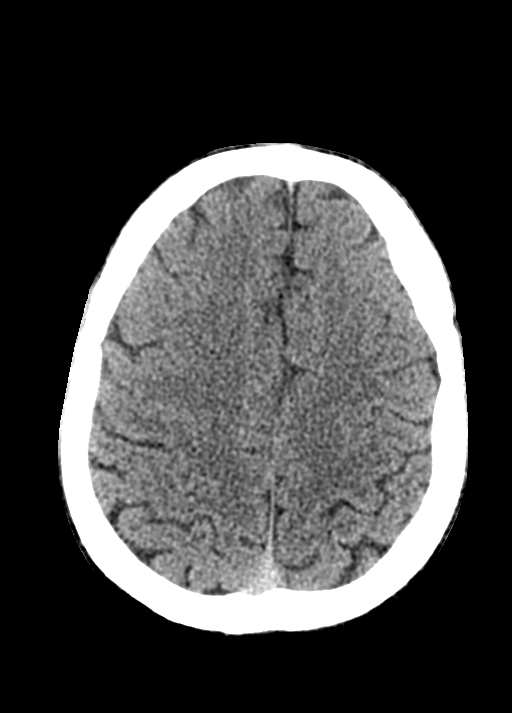
[im 49/56  brain]
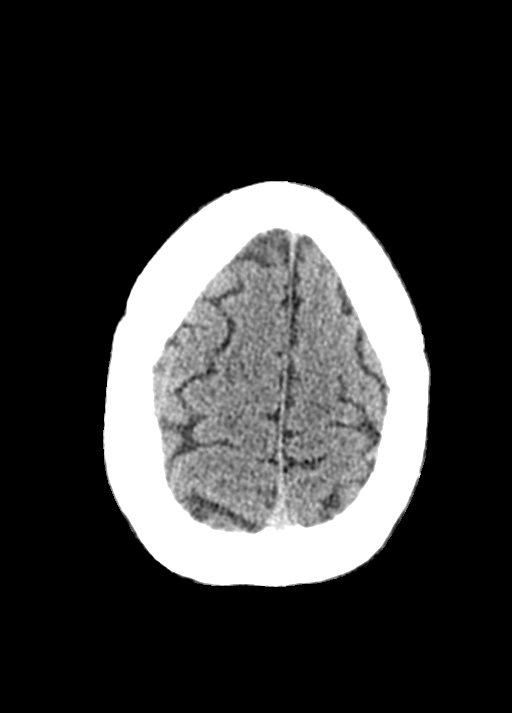

[14 of 47 positions shown; findings below may reference images not displayed]

FINDINGS: Brain: No evidence of acute infarction, hemorrhage, hydrocephalus,
extra-axial collection or mass lesion/mass effect.

Vascular: No hyperdense vessel or unexpected calcification.

Skull: Normal. Negative for fracture or focal lesion.

Sinuses/Orbits: No acute finding.

Other: None
IMPRESSION: Negative non contrasted CT appearance of the brain.

## 2023-11-02 ENCOUNTER — Emergency Department (HOSPITAL_COMMUNITY)
Admission: EM | Admit: 2023-11-02 | Discharge: 2023-11-03 | Disposition: A | Discharge status: Home / Self Care | Payer: Self-pay | Attending: Student | Admitting: Student

## 2023-11-02 ENCOUNTER — Encounter (HOSPITAL_COMMUNITY): Payer: Self-pay

## 2023-11-02 ENCOUNTER — Other Ambulatory Visit: Payer: Self-pay

## 2023-11-02 DIAGNOSIS — R911 Solitary pulmonary nodule: Secondary | ICD-10-CM | POA: Insufficient documentation

## 2023-11-02 DIAGNOSIS — R918 Other nonspecific abnormal finding of lung field: Secondary | ICD-10-CM

## 2023-11-02 DIAGNOSIS — L03221 Cellulitis of neck: Secondary | ICD-10-CM

## 2023-11-02 LAB — CBC WITH DIFFERENTIAL/PLATELET
Abs Immature Granulocytes: 0.03 10*3/uL (ref 0.00–0.07)
Basophils Absolute: 0 10*3/uL (ref 0.0–0.1)
Basophils Relative: 1 %
Eosinophils Absolute: 0.5 10*3/uL (ref 0.0–0.5)
Eosinophils Relative: 6 %
HCT: 39.5 % (ref 39.0–52.0)
Hemoglobin: 13.3 g/dL (ref 13.0–17.0)
Immature Granulocytes: 0 %
Lymphocytes Relative: 19 %
Lymphs Abs: 1.5 10*3/uL (ref 0.7–4.0)
MCH: 31.5 pg (ref 26.0–34.0)
MCHC: 33.7 g/dL (ref 30.0–36.0)
MCV: 93.6 fL (ref 80.0–100.0)
Monocytes Absolute: 1.1 10*3/uL — ABNORMAL HIGH (ref 0.1–1.0)
Monocytes Relative: 14 %
Neutro Abs: 4.7 10*3/uL (ref 1.7–7.7)
Neutrophils Relative %: 60 %
Platelets: 289 10*3/uL (ref 150–400)
RBC: 4.22 MIL/uL (ref 4.22–5.81)
RDW: 12.7 % (ref 11.5–15.5)
WBC: 7.8 10*3/uL (ref 4.0–10.5)
nRBC: 0 % (ref 0.0–0.2)

## 2023-11-02 LAB — COMPREHENSIVE METABOLIC PANEL WITH GFR
ALT: 76 U/L — ABNORMAL HIGH (ref 0–44)
AST: 69 U/L — ABNORMAL HIGH (ref 15–41)
Albumin: 3.1 g/dL — ABNORMAL LOW (ref 3.5–5.0)
Alkaline Phosphatase: 40 U/L (ref 38–126)
Anion gap: 7 (ref 5–15)
BUN: 7 mg/dL (ref 6–20)
CO2: 23 mmol/L (ref 22–32)
Calcium: 8.4 mg/dL — ABNORMAL LOW (ref 8.9–10.3)
Chloride: 107 mmol/L (ref 98–111)
Creatinine, Ser: 0.74 mg/dL (ref 0.61–1.24)
GFR, Estimated: >60 mL/min (ref >60)
Glucose, Bld: 141 mg/dL — ABNORMAL HIGH (ref 70–99)
Potassium: 3.9 mmol/L (ref 3.5–5.1)
Sodium: 137 mmol/L (ref 135–145)
Total Bilirubin: 0.5 mg/dL (ref 0.0–1.2)
Total Protein: 6.3 g/dL — ABNORMAL LOW (ref 6.5–8.1)

## 2023-11-02 LAB — I-STAT CG4 LACTIC ACID, ED: Lactic Acid, Venous: 1.6 mmol/L (ref 0.5–1.9)

## 2023-11-02 NOTE — ED Provider Triage Note (Signed)
 Emergency Medicine Provider Triage Evaluation Note  Rabon Scholle [facility] , a 38 y.o. male  was evaluated in triage.  Pt complains of bite or bite to the back of the neck x 2 days.  Not able to get comfortable due to discomfort last night..  Patient smokes, occasionally drinks alcohol, vapes THC, denies current or recent IVDA.  Review of Systems  Positive: Abscess, fever Negative: N/v  Physical Exam  BP (!) 137/112 (BP Location: Right Arm)   Pulse (!) 105   Temp 98.5 F (36.9 C)   Resp 20   SpO2 100%  Gen:   Awake, no distress   Resp:  Normal effort  MSK:   Moves extremities without difficulty  Other:    Medical Decision Making  Medically screening exam initiated at 7:24 PM.  Appropriate orders placed.  Wayland Baik [facility] was informed that the remainder of the evaluation will be completed by another provider, this initial triage assessment does not replace that evaluation, and the importance of remaining in the ED until their evaluation is complete.     Aimee Houseman, [place] 11/02/23 1926

## 2023-11-02 NOTE — ED Triage Notes (Signed)
 C/o spider bite to back of neck x 2 days ago; endorses fevers; endorses pain at site, erythema at site

## 2023-11-02 NOTE — ED Provider Notes (Signed)
 [place] [facility] AT [facility] Provider Note   CSN: [id number] Arrival date & time: 11/02/23  1832     History {Add pertinent medical, surgical, social history, OB history to HPI:1} No chief complaint on file.   Hunter [name] is a 38 y.o. male with past medical history of IVDU presents to emergency department for evaluation of "spider bite" to the back of her neck that started 2 days ago.  Endorses a fever of 102 0.2 F couple days ago, pain at site.  No difficulty with swallowing or moving neck.  HPI     Home Medications Prior to Admission medications   Medication Sig Start Date End Date Taking? Authorizing Provider  acetaminophen  (TYLENOL ) 325 MG tablet Take 2 tablets (650 mg total) by mouth every 6 (six) hours as needed for mild pain (or Fever >/= 101). Patient not taking: Reported on 05/22/2017 05/01/17   Lonita Roach, MD  bacitracin  ointment Apply topically 2 (two) times daily. 02/16/18   Lee, Joshua K, MD  naproxen  (NAPROSYN ) 500 MG tablet Take 1 tablet (500 mg total) by mouth 2 (two) times daily with a meal. 02/25/18   Ruel Cotta, Heinz Llano, MD  oxyCODONE  (ROXICODONE ) 5 MG immediate release tablet Take 0-1 tablets (0-5 mg total) by mouth every 6 (six) hours as needed for severe pain (postop pain). 02/16/18   Rober Chimera, MD  polyethylene glycol (MIRALAX  / GLYCOLAX ) packet Take 17 g by mouth daily as needed for mild constipation. 02/16/18   Lee, Joshua K, MD  QUEtiapine  (SEROQUEL ) 25 MG tablet Take 1 tablet (25 mg total) by mouth 2 (two) times daily. 02/25/18   Silva Zapata, Edwin, MD      Allergies    Patient has no known allergies.    Review of Systems   Review of Systems  Physical Exam Updated Vital Signs BP (!) 128/92 (BP Location: Left Arm)   Pulse 93   Temp 98.5 F (36.9 C)   Resp 20   SpO2 100%  Physical Exam  ED Results / Procedures / Treatments   Labs (all labs ordered are listed, but only abnormal results are  displayed) Labs Reviewed  COMPREHENSIVE METABOLIC PANEL WITH GFR - Abnormal; Notable for the following components:      Result Value   Glucose, Bld 141 (*)    Calcium 8.4 (*)    Total Protein 6.3 (*)    Albumin 3.1 (*)    AST 69 (*)    ALT 76 (*)    All other components within normal limits  CBC WITH DIFFERENTIAL/PLATELET - Abnormal; Notable for the following components:   Monocytes Absolute 1.1 (*)    All other components within normal limits  CULTURE, BLOOD (ROUTINE X 2)  CULTURE, BLOOD (ROUTINE X 2)  CBC WITH DIFFERENTIAL/PLATELET  I-STAT CG4 LACTIC ACID, ED  I-STAT CG4 LACTIC ACID, ED    EKG None  Radiology No results found.  Procedures Procedures  {Document cardiac monitor, telemetry assessment procedure when appropriate:1}  Medications Ordered in ED Medications - No data to display  ED Course/ Medical Decision Making/ A&P   {   Click here for ABCD2, HEART and other calculatorsREFRESH Note before signing :1}                              Medical Decision Making Amount and/or Complexity of Data Reviewed Labs: ordered.   ***  {Document critical care time when appropriate:1} {  Document review of labs and clinical decision tools ie heart score, Chads2Vasc2 etc:1}  {Document your independent review of radiology images, and any outside records:1} {Document your discussion with family members, caretakers, and with consultants:1} {Document social determinants of health affecting pt's care:1} {Document your decision making why or why not admission, treatments were needed:1} Final Clinical Impression(s) / ED Diagnoses Final diagnoses:  None    Rx / DC Orders ED Discharge Orders     None

## 2023-11-03 ENCOUNTER — Emergency Department (HOSPITAL_COMMUNITY): Payer: Self-pay

## 2023-11-03 MED ORDER — DOXYCYCLINE HYCLATE 100 MG PO CAPS: 100.0000 mg | ORAL_CAPSULE | Freq: Two times a day (BID) | ORAL | 0 refills | Status: AC

## 2023-11-03 MED ORDER — BUPRENORPHINE HCL-NALOXONE HCL 8-2 MG SL FILM: 1.0000 | ORAL_FILM | Freq: Every day | SUBLINGUAL | 0 refills | Status: AC

## 2023-11-03 MED ORDER — IOHEXOL 350 MG/ML SOLN
75.0000 mL | Freq: Once | INTRAVENOUS | Status: AC | PRN
  Administered 2023-11-03: 75 mL via INTRAVENOUS

## 2023-11-03 MED ORDER — DOXYCYCLINE HYCLATE 100 MG PO TABS
100.0000 mg | ORAL_TABLET | Freq: Once | ORAL | Status: AC
  Administered 2023-11-03: 100 mg via ORAL
  Filled 2023-11-03: qty 1

## 2023-11-03 MED ORDER — BUPRENORPHINE HCL-NALOXONE HCL 8-2 MG SL SUBL
1.0000 | SUBLINGUAL_TABLET | Freq: Once | SUBLINGUAL | Status: AC
  Administered 2023-11-03: 1 via SUBLINGUAL
  Filled 2023-11-03: qty 1

## 2023-11-03 NOTE — Discharge Instructions (Addendum)
 Thank you for let us  evaluate you today.  Your infection on your neck does not show any abscess that can be drained.  We have provided you with a dose of antibiotics here in emergency department.  Please make sure to pick up your prescription for antibiotic and take entire course.  Do not miss a dose.  You may take Tylenol  and ibuprofen  as needed for fever  We have sent blood cultures and we will call you if they are positive for you to return for IV antibiotics  Return to emergency department if you experience shortness of breath, chest pain

## 2023-11-07 LAB — CULTURE, BLOOD (ROUTINE X 2): Culture: NO GROWTH

## 2023-11-08 LAB — CULTURE, BLOOD (ROUTINE X 2): Culture: NO GROWTH
